# Patient Record
Sex: Female | Born: 1940 | Race: White | Hispanic: No | Smoking: Former smoker
Health system: Southern US, Community
[De-identification: ages and names within clinical notes are randomized; demographics above are authoritative.]

## PROBLEM LIST (undated history)

## (undated) DIAGNOSIS — C801 Malignant (primary) neoplasm, unspecified: Secondary | ICD-10-CM

## (undated) DIAGNOSIS — I712 Thoracic aortic aneurysm, without rupture: Secondary | ICD-10-CM

## (undated) DIAGNOSIS — M6281 Muscle weakness (generalized): Secondary | ICD-10-CM

## (undated) DIAGNOSIS — F329 Major depressive disorder, single episode, unspecified: Secondary | ICD-10-CM

## (undated) DIAGNOSIS — I2510 Atherosclerotic heart disease of native coronary artery without angina pectoris: Secondary | ICD-10-CM

## (undated) DIAGNOSIS — T8859XA Other complications of anesthesia, initial encounter: Secondary | ICD-10-CM

## (undated) DIAGNOSIS — G709 Myoneural disorder, unspecified: Secondary | ICD-10-CM

## (undated) DIAGNOSIS — K219 Gastro-esophageal reflux disease without esophagitis: Secondary | ICD-10-CM

## (undated) DIAGNOSIS — I7121 Aneurysm of the ascending aorta, without rupture: Secondary | ICD-10-CM

## (undated) DIAGNOSIS — M869 Osteomyelitis, unspecified: Secondary | ICD-10-CM

## (undated) DIAGNOSIS — T4145XA Adverse effect of unspecified anesthetic, initial encounter: Secondary | ICD-10-CM

## (undated) DIAGNOSIS — G20A1 Parkinson's disease without dyskinesia, without mention of fluctuations: Secondary | ICD-10-CM

## (undated) DIAGNOSIS — M1990 Unspecified osteoarthritis, unspecified site: Secondary | ICD-10-CM

## (undated) DIAGNOSIS — I10 Essential (primary) hypertension: Secondary | ICD-10-CM

## (undated) DIAGNOSIS — M353 Polymyalgia rheumatica: Secondary | ICD-10-CM

## (undated) DIAGNOSIS — E785 Hyperlipidemia, unspecified: Secondary | ICD-10-CM

## (undated) DIAGNOSIS — F3289 Other specified depressive episodes: Secondary | ICD-10-CM

## (undated) DIAGNOSIS — G20 Parkinson's disease: Secondary | ICD-10-CM

## (undated) DIAGNOSIS — D649 Anemia, unspecified: Secondary | ICD-10-CM

## (undated) DIAGNOSIS — R262 Difficulty in walking, not elsewhere classified: Secondary | ICD-10-CM

## (undated) HISTORY — DX: Essential (primary) hypertension: I10

## (undated) HISTORY — DX: Polymyalgia rheumatica: M35.3

## (undated) HISTORY — PX: OTHER SURGICAL HISTORY: SHX169

## (undated) HISTORY — DX: Thoracic aortic aneurysm, without rupture: I71.2

## (undated) HISTORY — DX: Parkinson's disease: G20

## (undated) HISTORY — DX: Difficulty in walking, not elsewhere classified: R26.2

## (undated) HISTORY — DX: Parkinson's disease without dyskinesia, without mention of fluctuations: G20.A1

## (undated) HISTORY — PX: PERIPHERALLY INSERTED CENTRAL CATHETER INSERTION: SHX2221

## (undated) HISTORY — PX: ROTATOR CUFF REPAIR: SHX139

## (undated) HISTORY — DX: Other specified depressive episodes: F32.89

## (undated) HISTORY — DX: Muscle weakness (generalized): M62.81

## (undated) HISTORY — DX: Malignant (primary) neoplasm, unspecified: C80.1

## (undated) HISTORY — DX: Aneurysm of the ascending aorta, without rupture: I71.21

## (undated) HISTORY — PX: JOINT REPLACEMENT: SHX530

## (undated) HISTORY — DX: Atherosclerotic heart disease of native coronary artery without angina pectoris: I25.10

## (undated) HISTORY — PX: KNEE SURGERY: SHX244

## (undated) HISTORY — DX: Anemia, unspecified: D64.9

## (undated) HISTORY — PX: VARICOSE VEIN SURGERY: SHX832

## (undated) HISTORY — DX: Major depressive disorder, single episode, unspecified: F32.9

## (undated) HISTORY — PX: TONSILLECTOMY: SUR1361

## (undated) HISTORY — DX: Gastro-esophageal reflux disease without esophagitis: K21.9

## (undated) HISTORY — DX: Hyperlipidemia, unspecified: E78.5

## (undated) HISTORY — DX: Unspecified osteoarthritis, unspecified site: M19.90

## (undated) HISTORY — PX: MELANOMA EXCISION: SHX5266

## (undated) HISTORY — DX: Osteomyelitis, unspecified: M86.9

---

## 1995-12-23 HISTORY — PX: BREAST LUMPECTOMY: SHX2

## 2003-12-23 HISTORY — PX: CHOLECYSTECTOMY: SHX55

## 2005-12-22 HISTORY — PX: CARDIAC CATHETERIZATION: SHX172

## 2011-12-23 HISTORY — PX: FOOT SURGERY: SHX648

## 2013-07-05 ENCOUNTER — Ambulatory Visit: Payer: Self-pay | Admitting: Internal Medicine

## 2013-07-08 ENCOUNTER — Encounter: Payer: Self-pay | Admitting: *Deleted

## 2013-07-11 ENCOUNTER — Ambulatory Visit (INDEPENDENT_AMBULATORY_CARE_PROVIDER_SITE_OTHER): Payer: Medicare Other | Admitting: Cardiovascular Disease

## 2013-07-11 ENCOUNTER — Encounter: Payer: Self-pay | Admitting: Cardiovascular Disease

## 2013-07-11 VITALS — BP 121/77 | HR 72 | Ht 66.0 in | Wt 153.5 lb

## 2013-07-11 DIAGNOSIS — I25810 Atherosclerosis of coronary artery bypass graft(s) without angina pectoris: Secondary | ICD-10-CM

## 2013-07-11 DIAGNOSIS — I712 Thoracic aortic aneurysm, without rupture, unspecified: Secondary | ICD-10-CM

## 2013-07-11 DIAGNOSIS — E785 Hyperlipidemia, unspecified: Secondary | ICD-10-CM

## 2013-07-11 DIAGNOSIS — I10 Essential (primary) hypertension: Secondary | ICD-10-CM

## 2013-07-11 DIAGNOSIS — I2510 Atherosclerotic heart disease of native coronary artery without angina pectoris: Secondary | ICD-10-CM | POA: Insufficient documentation

## 2013-07-11 DIAGNOSIS — I7121 Aneurysm of the ascending aorta, without rupture: Secondary | ICD-10-CM

## 2013-07-11 NOTE — Assessment & Plan Note (Signed)
This was measured at 4 cm. These can likely be followed with echocardiograms.     

## 2013-07-11 NOTE — Assessment & Plan Note (Signed)
Continue treatment with atorvastatin. Lipid and liver profile is recommended once a year.

## 2013-07-11 NOTE — Patient Instructions (Addendum)
Continue same medications.  Follow up in 6 months.  

## 2013-07-11 NOTE — Assessment & Plan Note (Addendum)
Overall, she seems to be stable from a cardiac standpoint with no symptoms suggestive of angina. Continue medical therapy. She is not able to tolerate aspirin due to easy bruising. She does have an abnormal EKG but does not seem to be different from most recent one. Most recent stress test in may of 2013 showed evidence of anterior apical score with no ischemia. Her functional capacity is very limited due to complications from hip replacement. If she requires right hip surgery, a cardiac stress test will be needed before the surgery for risk stratification..  Addendum 08/31/2013: The patient had a nuclear stress test which showed moderate/large size anterior wall partially reversible defect suggestive of ischemia with normal EF. Overall, moderate risk stress test. Due to abnormal stress test and poor function capacity, I recommend cardiac cath and possible PCI. Risks and benefits were discussed. She will need this before shoulder surgery.

## 2013-07-11 NOTE — Progress Notes (Signed)
HPI  This is a 72 year old female who is here today to establish cardiovascular care. She was referred from Dr. making house calls. She moved recently from [place] to be close to her family. She has known history of coronary artery disease with previous bare-metal stent placement to the left anterior descending artery in 2007. No cardiac events since then. Most recent stress test was in 2013 which showed no significant ischemia with ejection fraction of 53%. However, there was Akinesis of the distal anterior and apical walls. She suffers from complications related to hip replacement which were recalled. 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 She noted he had revision of left hip replacement and might need the same done on the right side. Her mobility is limited and she walks with a walker. She denies any chest pain, dyspnea or palpitations. She currently lives in an assisted living facility. She reports intolerance to aspirin due to easy bruising she is not allergic.  Allergies  Allergen Reactions  . Adhesive (Tape)   . Isosorbide Dinitrate   . Morphine And Related   . Oxycodone-Acetaminophen   . Sulfa Antibiotics   . Tazobactam      Current Outpatient Prescriptions on File Prior to Visit  Medication Sig Dispense Refill  . amitriptyline (ELAVIL) 25 MG tablet Take 25 mg by mouth at bedtime.      . ARIPiprazole (ABILIFY) 5 MG tablet Take 5 mg by mouth daily.      . carbidopa-levodopa (SINEMET IR) 25-100 MG per tablet Take 1 tablet by mouth 4 (four) times daily.      . carbidopa-levodopa-entacapone (STALEVO) 37.5-150-200 MG per tablet Take 1 tablet by mouth daily.      . clindamycin (CLEOCIN) 300 MG capsule Take 300 mg by mouth 2 (two) times daily.      [name] escitalopram (LEXAPRO) 10 MG tablet Take 15 mg by mouth daily.       . ferrous fumarate (HEMOCYTE) 325 (106 FE) MG TABS Take 1 tablet by mouth daily.      . metoprolol (LOPRESSOR) 100 MG  tablet Take 100 mg by mouth 2 (two) times daily.      . rifampin (RIFADIN) 300 MG capsule Take 300 mg by mouth 2 (two) times daily.      [name] rOPINIRole (REQUIP) 2 MG tablet Take 2 mg by mouth 3 (three) times daily.      . simvastatin (ZOCOR) 40 MG tablet Take 40 mg by mouth at bedtime.      . vitamin C (ASCORBIC ACID) 500 MG tablet Take 1,000 mg by mouth daily.       No current facility-administered medications on file prior to visit.     Past Medical History  Diagnosis Date  . Anemia, unspecified   . Depressive disorder, not elsewhere classified   . Unspecified osteomyelitis, site unspecified   . Esophageal reflux   . Polymyalgia rheumatica   . Paralysis agitans   . Osteoarthrosis, unspecified whether generalized or localized, unspecified site   . Muscle weakness (generalized)   . Difficulty in walking(719.7)   . Parkinson disease   . PMR (polymyalgia rheumatica)   . Cancer     left breast cancer  . Coronary atherosclerosis of unspecified type of vessel, native or graft     2007: LAD PCI with a BMS, 80% distal LCX stenosis treated medically. Most recent stress test in 04/2012 showed distal anterio/apical scar? with no ischemia, EF 53%.   [name]  Unspecified essential hypertension   . Other and unspecified hyperlipidemia   . Thoracic ascending aortic aneurysm     4.0 cm     Past Surgical History  Procedure Laterality Date  . Cardiac catheterization  2007    [place], [place]; x1 stent  . Knee surgery Left   . Varicose vein surgery    . Breast lumpectomy    . Melanoma excision      elbow  . Knee surgery Left   . Hip replacement Bilateral   . Foot surgery Right   . Rotator cuff repair Right      History reviewed. No pertinent family history.   History   Social History  . Marital Status: Widowed    Spouse Name: N/A    Number of Children: N/A  . Years of Education: N/A   Occupational History  . Not on file.   Social History Main Topics  . Smoking status: Former Smoker  -- 0.50 packs/day for 10 years    Types: Cigarettes  . Smokeless tobacco: Not on file  . Alcohol Use: Yes     Comment: rarely  . Drug Use: No  . Sexually Active: Not on file   Other Topics Concern  . Not on file   Social History Narrative  . No narrative on file     ROS Constitutional: Negative for fever, chills, diaphoresis, activity change, appetite change and fatigue.  HENT: Negative for hearing loss, nosebleeds, congestion, sore throat, facial swelling, drooling, trouble swallowing, neck pain, voice change, sinus pressure and tinnitus.  Eyes: Negative for photophobia, pain, discharge and visual disturbance.  Respiratory: Negative for apnea, cough, chest tightness, shortness of breath and wheezing.  Cardiovascular: Negative for chest pain, palpitations and leg swelling.  Gastrointestinal: Negative for nausea, vomiting, abdominal pain, diarrhea, constipation, blood in stool and abdominal distention.  Genitourinary: Negative for dysuria, urgency, frequency, hematuria and decreased urine volume.  Skin: Negative for color change, pallor, rash and wound.  Neurological: Negative for dizziness, tremors, seizures, syncope, speech difficulty, weakness, light-headedness, numbness and headaches.  Psychiatric/Behavioral: Negative for suicidal ideas, hallucinations, behavioral problems and agitation. The patient is not nervous/anxious.     PHYSICAL EXAM   BP 121/77  Pulse 72  Ht 5\' 6"  (1.676 m)  Wt 153 lb 8 oz (69.627 kg)  BMI 24.79 kg/m2 Constitutional: She is oriented to person, place, and time. She appears well-developed and well-nourished. No distress.  HENT: No nasal discharge.  Head: Normocephalic and atraumatic.  Eyes: Pupils are equal and round. Right eye exhibits no discharge. Left eye exhibits no discharge.  Neck: Normal range of motion. Neck supple. No JVD present. No thyromegaly present.  Cardiovascular: Normal rate, regular rhythm, normal heart sounds. Exam reveals no  gallop and no friction rub. No murmur heard.  Pulmonary/Chest: Effort normal and breath sounds normal. No stridor. No respiratory distress. She has no wheezes. She has no rales. She exhibits no tenderness.  Abdominal: Soft. Bowel sounds are normal. She exhibits no distension. There is no tenderness. There is no rebound and no guarding.  Musculoskeletal: Normal range of motion. She exhibits no edema and no tenderness.  Neurological: She is alert and oriented to person, place, and time. Coordination normal.  Skin: Skin is warm and dry. No rash noted. She is not diaphoretic. No erythema. No pallor.  Psychiatric: She has a normal mood and affect. Her behavior is normal. Judgment and thought content normal.     EKG: Sinus  Rhythm  Voltage criteria for LVH  (  S(V1)+R(V6) exceeds 3.50 mV)  -Voltage criteria w/o ST/T abnormality may be normal.   -  Negative precordial T-waves  -Probably normal -consider anteroseptal ischemia.   BORDERLINE   ASSESSMENT AND PLAN

## 2013-07-11 NOTE — Assessment & Plan Note (Signed)
Blood pressure is well controlled on medications. 

## 2013-07-13 ENCOUNTER — Encounter: Payer: Self-pay | Admitting: *Deleted

## 2013-07-14 ENCOUNTER — Encounter: Payer: Self-pay | Admitting: *Deleted

## 2013-07-18 ENCOUNTER — Ambulatory Visit: Payer: Medicare Other | Admitting: Internal Medicine

## 2013-07-18 ENCOUNTER — Encounter: Payer: Self-pay | Admitting: Internal Medicine

## 2013-07-18 VITALS — BP 102/64 | HR 69 | Temp 97.8°F

## 2013-07-18 DIAGNOSIS — T8459XS Infection and inflammatory reaction due to other internal joint prosthesis, sequela: Secondary | ICD-10-CM

## 2013-07-18 LAB — SEDIMENTATION RATE: Sed Rate: 58 mm/hr — ABNORMAL HIGH (ref 0–22)

## 2013-07-18 LAB — COMPLETE METABOLIC PANEL WITH GFR
AST: 19 U/L (ref 0–37)
Albumin: 3.8 g/dL (ref 3.5–5.2)
BUN: 16 mg/dL (ref 6–23)
Calcium: 9.1 mg/dL (ref 8.4–10.5)
Chloride: 104 mEq/L (ref 96–112)
Creat: 0.67 mg/dL (ref 0.50–1.10)
GFR, Est Non African American: 89 mL/min
Glucose, Bld: 90 mg/dL (ref 70–99)
Potassium: 4.6 mEq/L (ref 3.5–5.3)

## 2013-07-18 LAB — C-REACTIVE PROTEIN: CRP: <0.5 mg/dL (ref <0.60)
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
## 2013-08-08 ENCOUNTER — Ambulatory Visit: Payer: Self-pay | Admitting: Orthopedic Surgery

## 2013-08-18 ENCOUNTER — Other Ambulatory Visit: Payer: Self-pay | Admitting: Orthopedic Surgery

## 2013-08-22 HISTORY — PX: CARDIAC CATHETERIZATION: SHX172

## 2013-08-23 ENCOUNTER — Telehealth: Payer: Self-pay

## 2013-08-23 ENCOUNTER — Other Ambulatory Visit: Payer: Self-pay

## 2013-08-23 DIAGNOSIS — Z01810 Encounter for preprocedural cardiovascular examination: Secondary | ICD-10-CM

## 2013-08-23 NOTE — Telephone Encounter (Signed)
Spoke w/ pt.  Dr. Kirke Corin wants her to have a lexiscan myoview before he will give her cardiac clearance for shoulder surgery on 9/11.   Pt sched for Lexiscan at [facility] 08/26/13 @ 10:00 to arrive at 9:30. Instructions mailed to pt 08/23/14.

## 2013-08-24 ENCOUNTER — Ambulatory Visit (INDEPENDENT_AMBULATORY_CARE_PROVIDER_SITE_OTHER): Payer: Medicare Other | Admitting: Internal Medicine

## 2013-08-24 ENCOUNTER — Encounter: Payer: Self-pay | Admitting: Internal Medicine

## 2013-08-24 VITALS — BP 107/68 | HR 65 | Temp 98.3°F | Ht 66.0 in | Wt 155.0 lb

## 2013-08-24 DIAGNOSIS — T889XXS Complication of surgical and medical care, unspecified, sequela: Secondary | ICD-10-CM

## 2013-08-24 DIAGNOSIS — T8459XS Infection and inflammatory reaction due to other internal joint prosthesis, sequela: Secondary | ICD-10-CM

## 2013-08-24 LAB — COMPLETE METABOLIC PANEL WITH GFR
ALT: <8 U/L (ref 0–35)
CO2: 27 mEq/L (ref 19–32)
GFR, Est African American: >89 mL/min
Sodium: 142 mEq/L (ref 135–145)
Total Bilirubin: 0.5 mg/dL (ref 0.3–1.2)
Total Protein: 6.7 g/dL (ref 6.0–8.3)

## 2013-08-24 MED ORDER — CHLORHEXIDINE GLUCONATE CLOTH 2 % EX PADS: 6.0000 | MEDICATED_PAD | Freq: Every day | CUTANEOUS | Status: DC

## 2013-08-24 MED ORDER — RIFAMPIN 300 MG PO CAPS: 300.0000 mg | ORAL_CAPSULE | Freq: Two times a day (BID) | ORAL | Status: DC

## 2013-08-24 MED ORDER — MUPIROCIN 2 % EX OINT: TOPICAL_OINTMENT | Freq: Two times a day (BID) | CUTANEOUS | Status: DC

## 2013-08-24 MED ORDER — CLINDAMYCIN HCL 300 MG PO CAPS: 300.0000 mg | ORAL_CAPSULE | Freq: Two times a day (BID) | ORAL | Status: DC

## 2013-08-24 NOTE — Progress Notes (Signed)
[facility] NOTE  RFV: mrsa prosthetic joint infection  Subjective:    Patient ID: Sabrina Duncan, female    DOB: September 29, 1941, 72 y.o.   MRN: [id number]  HPI 72 year old female with h/o parkisons, on sinemet,  coronary artery disease with previous bare-metal stent placement to the left anterior descending artery in 2007.and numerous orthopedic surgeries c/b joint infections. 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 She was first seen at end of July at [facility]. Inflammatory markers still elevated at  Lab Results  Component Value Date   ESRSEDRATE 58* 07/18/2013   Lab Results  Component Value Date   CRP <0.5 07/18/2013   - has had numerous surgeries and replacement due to recall on implants.  The patient was evaluated by Dr. Jones Broom from [facility] and have recommended to undergo right shoulder total arthoplasty. She is undergoing cardiac evaluation this coming Friday. And pre-op evaluation.  Ortho hx:                                -TKR in oct  lef tTHR 2010, HW failiure, repair 2010  fx elbow 2011  HW repomve of left elbow 2010  HW removal left hip rivsion 2013  steged 2 revionso July 2013  Right foot surgery 2013,  Left hip surgery repair jan 2014  Left hip revison [facility] 2014,  Current Outpatient Prescriptions on File Prior to Visit  Medication Sig Dispense Refill  . acetaminophen (TYLENOL) 500 MG tablet Take 500 mg by mouth as needed for pain.      [name] acetaminophen-codeine (TYLENOL #4) 300-60 MG per tablet Take 1 tablet by mouth every 6 (six) hours as needed for pain.      [name] amitriptyline (ELAVIL) 25 MG tablet Take 25 mg by mouth at bedtime.      . ARIPiprazole (ABILIFY) 5 MG tablet Take 5 mg by mouth daily.      [name] atorvastatin (LIPITOR) 20 MG tablet Take 20 mg by mouth daily.      .  carbidopa-levodopa (SINEMET IR) 25-100 MG per tablet Take 1 tablet by mouth 4 (four) times daily.      . carbidopa-levodopa-entacapone (STALEVO) 37.5-150-200 MG per tablet Take 1 tablet by mouth daily.      . clindamycin (CLEOCIN) 300 MG capsule Take 300 mg by mouth 2 (two) times daily.      [name] escitalopram (LEXAPRO) 10 MG tablet Take 15 mg by mouth daily.       . ferrous fumarate (HEMOCYTE) 325 (106 FE) MG TABS Take 1 tablet by mouth daily.      [name] loperamide (IMODIUM) 2 MG capsule Take 2 mg by mouth as needed for diarrhea or loose stools.      . metoprolol (LOPRESSOR) 100 MG tablet Take 100 mg by mouth 2 (two) times daily.      . rifampin (RIFADIN) 300 MG capsule Take 300 mg by mouth 2 (two) times daily.      [name] rOPINIRole (REQUIP) 2 MG tablet Take 2 mg by mouth 3 (three) times daily.      . vitamin C (ASCORBIC ACID) 500 MG tablet Take 1,000 mg by mouth daily.       No current facility-administered medications on file prior to visit.   Active Ambulatory Problems  Diagnosis Date Noted  . Coronary atherosclerosis of unspecified type of vessel, native or graft   . Unspecified essential hypertension   . Other and unspecified hyperlipidemia   . Thoracic ascending aortic aneurysm    Resolved Ambulatory Problems    Diagnosis Date Noted  . No Resolved Ambulatory Problems   Past Medical History  Diagnosis Date  . Anemia, unspecified   . Depressive disorder, not elsewhere classified   . Unspecified osteomyelitis, site unspecified   . Esophageal reflux   . Polymyalgia rheumatica   . Paralysis agitans   . Osteoarthrosis, unspecified whether generalized or localized, unspecified site   . Muscle weakness (generalized)   . Difficulty in walking(719.7)   . Parkinson disease   . PMR (polymyalgia rheumatica)   . Cancer    Soc hx: unchanged, still living at SNF    Review of Systems No dysuria, no back pain, no fever, chills, no symptoms suggestive of UTI    Objective:   Physical  Exam  BP 107/68  Pulse 65  Temp(Src) 98.3 F (36.8 C) (Oral)  Ht 5\' 6"  (1.676 m)  Wt 155 lb (70.308 kg)  BMI 25.03 kg/m2 Physical Exam  Constitutional:  oriented to person, place, and time. appears well-developed and well-nourished. No distress.  HENT:  Mouth/Throat: Oropharynx is clear and moist. No oropharyngeal exudate.  Cardiovascular: Normal rate, regular rhythm and normal heart sounds. Exam reveals no gallop and no friction rub.  No murmur heard.  Pulmonary/Chest: Effort normal and breath sounds normal. No respiratory distress. no wheezes.  Abdominal: Soft. Bowel sounds are normal. exhibits no distension. There is no tenderness.  Lymphadenopathy:  no cervical adenopathy.  Neurological: alert and oriented to person, place, and time.  Skin: Skin is warm and dry. No rash noted. No erythema.  Psychiatric: a normal mood and affect.  behavior is normal.       Assessment & Plan:  MRSA prosthetic joint infection = will continue on clindamycin and rifampin until we see her in 4 wks.. Will check sed rate, crp, and cmp.  Upcoming shoulder arthroplasty = due to having prior mrsa infections, we will decolonize with mupirocin intranasally TID x 5 days and anticipate that Pre Admit Testing clinic visit will give chlorohexedine wipes for 3 days prior to surgery to help minimize risk of surgery. She states that she has a UA/Urine cx to be collected this evening as ordered by surgery. Since she is not having any urinary symptoms, I don't believe it is necessary to test, nor to treat. Per IDSA guidelines, asymptomatic bacturia not necessarily needing treatment.  She is medically cleared for upcoming shoulder surgery

## 2013-08-25 ENCOUNTER — Telehealth: Payer: Self-pay | Admitting: *Deleted

## 2013-08-25 NOTE — Telephone Encounter (Signed)
Spoke with [employer].  Needed clarifiaction about when to start Mupurocin ointment.  Pt scheduled for surgery on 09/01/13.  To start 5 days prior to surgery.  Faxed last OV note to [facility].

## 2013-08-25 NOTE — Telephone Encounter (Signed)
Returned call.  Unable to speak directly with the nurse.  Left message to return call.  Dr. Feliz Beam notes reflect pre-treatment information.

## 2013-08-26 ENCOUNTER — Ambulatory Visit: Payer: Self-pay | Admitting: Cardiovascular Disease

## 2013-08-26 DIAGNOSIS — Z01810 Encounter for preprocedural cardiovascular examination: Secondary | ICD-10-CM

## 2013-08-29 ENCOUNTER — Other Ambulatory Visit: Payer: Self-pay

## 2013-08-29 ENCOUNTER — Telehealth: Payer: Self-pay | Admitting: *Deleted

## 2013-08-29 ENCOUNTER — Other Ambulatory Visit (HOSPITAL_COMMUNITY): Payer: 59

## 2013-08-29 DIAGNOSIS — Z01810 Encounter for preprocedural cardiovascular examination: Secondary | ICD-10-CM

## 2013-08-29 NOTE — Telephone Encounter (Signed)
Left message for Sabrina Duncan that we will have to postpone pt's shoulder surgery until after her cardiac cath.

## 2013-08-29 NOTE — Telephone Encounter (Signed)
Dr. Veda Canning office called needing her cardiac clearance for surgery.

## 2013-08-29 NOTE — Telephone Encounter (Signed)
Sabrina Duncan "nurse" at patient's assisted living facility called and left voice mail that patient's surgery for 09/01/13 has been discontinued and she needs a D/C order for the bactriban ointment and body wash. I returned the call and the phone was answered by a patient; I asked to speak with Sabrina Duncan or another nurse and was told it was not that kind of facility and I would need to call back in 20 minutes to speak to a "nurse".   Wendall Mola CMA

## 2013-08-30 ENCOUNTER — Other Ambulatory Visit (HOSPITAL_COMMUNITY): Payer: 59

## 2013-08-30 ENCOUNTER — Telehealth: Payer: Self-pay

## 2013-08-30 NOTE — Telephone Encounter (Signed)
Nursing home was questioning whether to continue with shoulder surgery prep.   Instructed them that they should contact [facility] for any surgery prep from their office.   However, we do not require a medicated body wash the night before catheterization.

## 2013-08-30 NOTE — Telephone Encounter (Signed)
Pt med tech called and has question about catherization. Please call.

## 2013-09-01 ENCOUNTER — Ambulatory Visit: Payer: Self-pay | Admitting: Cardiovascular Disease

## 2013-09-01 ENCOUNTER — Encounter (HOSPITAL_COMMUNITY): Admission: RE | Payer: Self-pay | Source: Ambulatory Visit

## 2013-09-01 ENCOUNTER — Inpatient Hospital Stay (HOSPITAL_COMMUNITY): Admission: RE | Admit: 2013-09-01 | Payer: 59 | Source: Ambulatory Visit | Admitting: Orthopedic Surgery

## 2013-09-01 DIAGNOSIS — I2510 Atherosclerotic heart disease of native coronary artery without angina pectoris: Secondary | ICD-10-CM

## 2013-09-01 SURGERY — ARTHROPLASTY, SHOULDER, TOTAL, REVERSE
Anesthesia: Choice | Laterality: Right

## 2013-09-06 ENCOUNTER — Telehealth: Payer: Self-pay

## 2013-09-06 NOTE — Telephone Encounter (Signed)
Spoke w/ Tobi Bastos at [facility] and let her know that pt may proceed with shoulder surgery.

## 2013-09-06 NOTE — Telephone Encounter (Signed)
Message copied by Marilynne Halsted on Tue Sep 06, 2013  4:23 PM ------      Message from: Lorine Bears A      Created: Mon Sep 05, 2013  5:17 PM       Cardiac cath was fine. I already discussed this with the patient. Please notify Dr. Veda Canning office (orthopedics) of cath results and that they can go ahead and proceed with shoulder surgery. ------

## 2013-09-14 ENCOUNTER — Telehealth: Payer: Self-pay

## 2013-09-14 ENCOUNTER — Telehealth: Payer: Self-pay | Admitting: *Deleted

## 2013-09-14 NOTE — Telephone Encounter (Signed)
Message copied by Lurlean Leyden on Wed Sep 14, 2013  9:09 AM ------      Message from: Judyann Munson      Created: Sun Sep 11, 2013 10:39 PM      Regarding: follow up appt       Can we make sure we have an appt for Sabrina Duncan coming up. thanks ------

## 2013-09-14 NOTE — Telephone Encounter (Signed)
She has an appt 09/21/13 to see you. Called and she intends to keep this appt.

## 2013-09-14 NOTE — Telephone Encounter (Signed)
She is at low risk from a cardiac standpoint.  

## 2013-09-14 NOTE — Telephone Encounter (Signed)
Needs clearance faxed for shoulder surgery. Fax [phone]

## 2013-09-16 ENCOUNTER — Ambulatory Visit: Payer: 59 | Admitting: Cardiovascular Disease

## 2013-09-21 ENCOUNTER — Encounter: Payer: Self-pay | Admitting: Internal Medicine

## 2013-09-21 ENCOUNTER — Ambulatory Visit (INDEPENDENT_AMBULATORY_CARE_PROVIDER_SITE_OTHER): Payer: Medicare Other | Admitting: Internal Medicine

## 2013-09-21 ENCOUNTER — Telehealth: Payer: Self-pay | Admitting: *Deleted

## 2013-09-21 VITALS — BP 128/73 | HR 75 | Temp 97.4°F | Wt 155.0 lb

## 2013-09-21 DIAGNOSIS — T889XXS Complication of surgical and medical care, unspecified, sequela: Secondary | ICD-10-CM

## 2013-09-21 DIAGNOSIS — T8450XS Infection and inflammatory reaction due to unspecified internal joint prosthesis, sequela: Secondary | ICD-10-CM

## 2013-09-21 LAB — C-REACTIVE PROTEIN: CRP: <0.5 mg/dL (ref <0.60)

## 2013-09-21 LAB — SEDIMENTATION RATE: Sed Rate: 93 mm/hr — ABNORMAL HIGH (ref 0–22)

## 2013-09-21 NOTE — Progress Notes (Signed)
[facility] NOTE  RFV: routine, follow up for chronic PJI Subjective:    Patient ID: Sabrina Duncan, female    DOB: 07-04-1941, 72 y.o.   MRN: [id number]  HPI 72 year old female with h/o parkisons, on sinemet, coronary artery disease with previous bare-metal stent placement to the left anterior descending artery in 2007.and numerous orthopedic surgeries c/b joint infections. 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 She was first seen at end of July at [facility]. Inflammatory markers still elevated at sed rate of 71 and crp 0.5.  She states that her right shoulder is very stiff and painful to move. She is looking forward to having her shoulder surgery on the 9th. Has PAT visit on the 5th. Her surgery was postponed last month due to + stress test, but heart catheterization was fine. She is now cleared from cardiac standpoint. She states that she had urinary frequency that has resolved, often has to go to the bathroom on 1-2 am. She had recent ua nad urine cutlure that was negative per patient report.  Tolerating clindamycin and rifampin for chronic suppression of hip.  Current Outpatient Prescriptions on File Prior to Visit  Medication Sig Dispense Refill  . acetaminophen (TYLENOL) 500 MG tablet Take 500 mg by mouth as needed for pain.      [name] acetaminophen-codeine (TYLENOL #4) 300-60 MG per tablet Take 1 tablet by mouth every 6 (six) hours as needed for pain.      [name] amitriptyline (ELAVIL) 25 MG tablet Take 25 mg by mouth at bedtime.      . ARIPiprazole (ABILIFY) 5 MG tablet Take 5 mg by mouth daily.      [name] atorvastatin (LIPITOR) 20 MG tablet Take 20 mg by mouth daily.      . carbidopa-levodopa (SINEMET IR) 25-100 MG per tablet Take 1 tablet by mouth 4 (four) times daily.      . carbidopa-levodopa-entacapone (STALEVO) 37.5-150-200  MG per tablet Take 1 tablet by mouth daily.      . Chlorhexidine Gluconate Cloth 2 % PADS Apply 6 each topically daily at 6 (six) AM.  18 each  1  . clindamycin (CLEOCIN) 300 MG capsule Take 1 capsule (300 mg total) by mouth 2 (two) times daily.  60 capsule  3  . escitalopram (LEXAPRO) 10 MG tablet Take 15 mg by mouth daily.       . feeding supplement (ENSURE IMMUNE HEALTH) LIQD Take 237 mLs by mouth every morning.      . ferrous fumarate (HEMOCYTE) 325 (106 FE) MG TABS Take 1 tablet by mouth daily.      [name] loperamide (IMODIUM) 2 MG capsule Take 2 mg by mouth as needed for diarrhea or loose stools.      . metoprolol (LOPRESSOR) 100 MG tablet Take 100 mg by mouth 2 (two) times daily.      . mupirocin ointment (BACTROBAN) 2 % Apply topically 2 (two) times daily. Intranasally x 5 days  15 g  0  . rifampin (RIFADIN) 300 MG capsule Take 300 mg by mouth 2 (two) times daily.      . rifampin (RIFADIN) 300 MG capsule Take 1 capsule (300 mg total) by mouth 2 (two) times daily.  60 capsule  3  . rOPINIRole (REQUIP) 2 MG tablet Take 2 mg by mouth 3 (three) times  daily.      . vitamin C (ASCORBIC ACID) 500 MG tablet Take 1,000 mg by mouth daily.       No current facility-administered medications on file prior to visit.   Active Ambulatory Problems    Diagnosis Date Noted  . Coronary atherosclerosis of unspecified type of vessel, native or graft   . Unspecified essential hypertension   . Other and unspecified hyperlipidemia   . Thoracic ascending aortic aneurysm    Resolved Ambulatory Problems    Diagnosis Date Noted  . No Resolved Ambulatory Problems   Past Medical History  Diagnosis Date  . Anemia, unspecified   . Depressive disorder, not elsewhere classified   . Unspecified osteomyelitis, site unspecified   . Esophageal reflux   . Polymyalgia rheumatica   . Paralysis agitans   . Osteoarthrosis, unspecified whether generalized or localized, unspecified site   . Muscle weakness (generalized)    . Difficulty in walking(719.7)   . Parkinson disease   . PMR (polymyalgia rheumatica)   . Cancer        Review of Systems     Objective:   Physical Exam BP 128/73  Pulse 75  Temp(Src) 97.4 F (36.3 C) (Oral)  Wt 155 lb (70.308 kg)  BMI 25.03 kg/m2 Physical Exam  Constitutional:  oriented to person, place, and time. He appears well-developed and well-nourished. No distress.  HENT:  Mouth/Throat: Oropharynx is clear and moist. No oropharyngeal exudate.  Cardiovascular: Normal rate, regular rhythm and normal heart sounds. Exam reveals no gallop and no friction rub.  No murmur heard.  Pulmonary/Chest: Effort normal and breath sounds normal. No respiratory distress.  no wheezes.  Abdominal: Soft. Bowel sounds are normal. He exhibits no distension. There is no tenderness.  Lymphadenopathy: no cervical adenopathy.  Neurological: alert and oriented to person, place, and time.  Skin: Skin is warm and dry. No rash noted. No erythema.  Psychiatric:  a normal mood and affect.  behavior is normal.        Assessment & Plan:  Chronic pji = check inflam markers, sed rate, crp, and cbc with diff  mrsa decolonization = mupirocin cream and baths 5 days prior to surgery  Health maintenance = recommend shingles at her next visit or through her pcp  rtc in 4 -5 wks.

## 2013-09-21 NOTE — Telephone Encounter (Signed)
Mupirocin ointment to start 5 days prior to surgery on 09/29/13.  Start date would be 09/24/13.  Will fax handwritten order to [facility].

## 2013-09-22 ENCOUNTER — Ambulatory Visit (INDEPENDENT_AMBULATORY_CARE_PROVIDER_SITE_OTHER): Payer: Medicare Other | Admitting: Cardiovascular Disease

## 2013-09-22 ENCOUNTER — Encounter: Payer: Self-pay | Admitting: Cardiovascular Disease

## 2013-09-22 ENCOUNTER — Ambulatory Visit: Payer: Medicare Other | Admitting: Cardiovascular Disease

## 2013-09-22 VITALS — BP 91/58 | HR 60 | Ht 66.0 in | Wt 154.5 lb

## 2013-09-22 DIAGNOSIS — I712 Thoracic aortic aneurysm, without rupture, unspecified: Secondary | ICD-10-CM

## 2013-09-22 DIAGNOSIS — E785 Hyperlipidemia, unspecified: Secondary | ICD-10-CM

## 2013-09-22 DIAGNOSIS — I2510 Atherosclerotic heart disease of native coronary artery without angina pectoris: Secondary | ICD-10-CM

## 2013-09-22 DIAGNOSIS — I10 Essential (primary) hypertension: Secondary | ICD-10-CM

## 2013-09-22 NOTE — Progress Notes (Signed)
HPI  This is a 72 year old female who is here for a follow up visit regarding CAD. She moved recently from [place] to be close to her family. She has known history of coronary artery disease with previous bare-metal stent placement to the left anterior descending artery in 2007. No cardiac events since then.  She suffers from complications related to hip replacement which were recalled. 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 She noted he had revision of left hip replacement and might need the same done on the right side. Her mobility is limited and she walks with a walker. Functional capacity is limited due to that. She underwent a nuclear stress test which showed possible ischemic/previous scar in anterior wall. I proceeded with cardiac cath which showed patient LAD stent with mild disease overall.   Allergies  Allergen Reactions  . Adhesive [Tape]   . Isosorbide Dinitrate   . Morphine And Related   . Oxycodone-Acetaminophen   . Sulfa Antibiotics   . Tazobactam      Current Outpatient Prescriptions on File Prior to Visit  Medication Sig Dispense Refill  . acetaminophen (TYLENOL) 500 MG tablet Take 500 mg by mouth as needed for pain.      [name] acetaminophen-codeine (TYLENOL #4) 300-60 MG per tablet Take 1 tablet by mouth every 6 (six) hours as needed for pain.      [name] amitriptyline (ELAVIL) 25 MG tablet Take 25 mg by mouth at bedtime.      . ARIPiprazole (ABILIFY) 5 MG tablet Take 5 mg by mouth daily.      [name] atorvastatin (LIPITOR) 20 MG tablet Take 20 mg by mouth daily.      . carbidopa-levodopa (SINEMET IR) 25-100 MG per tablet Take 1 tablet by mouth 4 (four) times daily.      . carbidopa-levodopa-entacapone (STALEVO) 37.5-150-200 MG per tablet Take 1 tablet by mouth daily.      . Chlorhexidine Gluconate Cloth 2 % PADS Apply 6 each topically daily at 6 (six) AM.  18 each  1  . clindamycin (CLEOCIN) 300 MG capsule Take 1 capsule (300 mg  total) by mouth 2 (two) times daily.  60 capsule  3  . escitalopram (LEXAPRO) 10 MG tablet Take 15 mg by mouth daily.       . feeding supplement (ENSURE IMMUNE HEALTH) LIQD Take 237 mLs by mouth every morning.      . ferrous fumarate (HEMOCYTE) 325 (106 FE) MG TABS Take 1 tablet by mouth daily.      [name] loperamide (IMODIUM) 2 MG capsule Take 2 mg by mouth as needed for diarrhea or loose stools.      . metoprolol (LOPRESSOR) 100 MG tablet Take 100 mg by mouth 2 (two) times daily.      . mupirocin ointment (BACTROBAN) 2 % Apply topically 2 (two) times daily. Intranasally x 5 days  15 g  0  . rifampin (RIFADIN) 300 MG capsule Take 300 mg by mouth 2 (two) times daily.      . rifampin (RIFADIN) 300 MG capsule Take 1 capsule (300 mg total) by mouth 2 (two) times daily.  60 capsule  3  . rOPINIRole (REQUIP) 2 MG tablet Take 2 mg by mouth 3 (three) times daily.      . vitamin C (ASCORBIC ACID) 500 MG tablet Take 1,000 mg by mouth daily.       No current facility-administered medications on file prior to visit.  Past Medical History  Diagnosis Date  . Anemia, unspecified   . Depressive disorder, not elsewhere classified   . Unspecified osteomyelitis, site unspecified   . Esophageal reflux   . Polymyalgia rheumatica   . Paralysis agitans   . Osteoarthrosis, unspecified whether generalized or localized, unspecified site   . Muscle weakness (generalized)   . Difficulty in walking(719.7)   . Parkinson disease   . PMR (polymyalgia rheumatica)   . Cancer     left breast cancer  . Coronary atherosclerosis of unspecified type of vessel, native or graft     2007: LAD PCI with a BMS, 80% distal LCX stenosis treated medically. Most recent stress test in 04/2012 showed distal anterio/apical scar? with no ischemia, EF 53%.   [name] Unspecified essential hypertension   . Other and unspecified hyperlipidemia   . Thoracic ascending aortic aneurysm     4.0 cm     Past Surgical History  Procedure  Laterality Date  . Cardiac catheterization  2007    [place], [place]; x1 stent  . Knee surgery Left   . Varicose vein surgery    . Breast lumpectomy    . Melanoma excision      elbow  . Knee surgery Left   . Hip replacement Bilateral   . Foot surgery Right   . Rotator cuff repair Right      No family history on file.   History   Social History  . Marital Status: Widowed    Spouse Name: N/A    Number of Children: N/A  . Years of Education: N/A   Occupational History  . Not on file.   Social History Main Topics  . Smoking status: Former Smoker -- 0.50 packs/day for 10 years    Types: Cigarettes  . Smokeless tobacco: Not on file  . Alcohol Use: Yes     Comment: rarely  . Drug Use: No  . Sexual Activity: Not on file   Other Topics Concern  . Not on file   Social History Narrative  . No narrative on file       PHYSICAL EXAM   There were no vitals taken for this visit. Constitutional: She is oriented to person, place, and time. She appears well-developed and well-nourished. No distress.  HENT: No nasal discharge.  Head: Normocephalic and atraumatic.  Eyes: Pupils are equal and round. Right eye exhibits no discharge. Left eye exhibits no discharge.  Neck: Normal range of motion. Neck supple. No JVD present. No thyromegaly present.  Cardiovascular: Normal rate, regular rhythm, normal heart sounds. Exam reveals no gallop and no friction rub. No murmur heard.  Pulmonary/Chest: Effort normal and breath sounds normal. No stridor. No respiratory distress. She has no wheezes. She has no rales. She exhibits no tenderness.  Abdominal: Soft. Bowel sounds are normal. She exhibits no distension. There is no tenderness. There is no rebound and no guarding.  Musculoskeletal: Normal range of motion. She exhibits no edema and no tenderness.  Neurological: She is alert and oriented to person, place, and time. Coordination normal.  Skin: Skin is warm and dry. No rash noted. She is  not diaphoretic. No erythema. No pallor.  Psychiatric: She has a normal mood and affect. Her behavior is normal. Judgment and thought content normal.       ASSESSMENT AND PLAN

## 2013-09-22 NOTE — Patient Instructions (Addendum)
Continue same medications.   Your physician wants you to follow-up in: 6 months.  You will receive a reminder letter in the mail two months in advance. If you don't receive a letter, please call our office to schedule the follow-up appointment.  

## 2013-09-23 ENCOUNTER — Encounter (HOSPITAL_COMMUNITY): Payer: Self-pay | Admitting: Respiratory Therapy

## 2013-09-26 ENCOUNTER — Encounter (HOSPITAL_COMMUNITY)
Admission: RE | Admit: 2013-09-26 | Discharge: 2013-09-26 | Disposition: A | Discharge status: Home / Self Care | Payer: Medicare Other | Source: Ambulatory Visit | Attending: Orthopedic Surgery | Admitting: Orthopedic Surgery

## 2013-09-26 ENCOUNTER — Encounter (HOSPITAL_COMMUNITY): Payer: Self-pay

## 2013-09-26 DIAGNOSIS — Z01812 Encounter for preprocedural laboratory examination: Secondary | ICD-10-CM | POA: Insufficient documentation

## 2013-09-26 DIAGNOSIS — Z01818 Encounter for other preprocedural examination: Secondary | ICD-10-CM | POA: Insufficient documentation

## 2013-09-26 HISTORY — DX: Myoneural disorder, unspecified: G70.9

## 2013-09-26 HISTORY — DX: Other complications of anesthesia, initial encounter: T88.59XA

## 2013-09-26 HISTORY — DX: Adverse effect of unspecified anesthetic, initial encounter: T41.45XA

## 2013-09-26 LAB — COMPREHENSIVE METABOLIC PANEL
ALT: <5 U/L (ref 0–35)
AST: 19 U/L (ref 0–37)
Alkaline Phosphatase: 145 U/L — ABNORMAL HIGH (ref 39–117)
BUN: 15 mg/dL (ref 6–23)
CO2: 28 mEq/L (ref 19–32)
Calcium: 9.3 mg/dL (ref 8.4–10.5)
GFR calc Af Amer: >90 mL/min (ref >90)
GFR calc non Af Amer: >90 mL/min (ref >90)
Glucose, Bld: 84 mg/dL (ref 70–99)
Potassium: 4.4 mEq/L (ref 3.5–5.1)
Sodium: 140 mEq/L (ref 135–145)
Total Bilirubin: 0.4 mg/dL (ref 0.3–1.2)
Total Protein: 7.2 g/dL (ref 6.0–8.3)

## 2013-09-26 LAB — CBC WITH DIFFERENTIAL/PLATELET
Eosinophils Absolute: 0.1 10*3/uL (ref 0.0–0.7)
Eosinophils Relative: 3 % (ref 0–5)
HCT: 38.5 % (ref 36.0–46.0)
Hemoglobin: 12.8 g/dL (ref 12.0–15.0)
Lymphocytes Relative: 23 % (ref 12–46)
Lymphs Abs: 0.9 10*3/uL (ref 0.7–4.0)
MCH: 33.3 pg (ref 26.0–34.0)
MCHC: 33.2 g/dL (ref 30.0–36.0)
MCV: 100.3 fL — ABNORMAL HIGH (ref 78.0–100.0)
Monocytes Relative: 10 % (ref 3–12)
Neutrophils Relative %: 62 % (ref 43–77)
Platelets: 188 10*3/uL (ref 150–400)
RBC: 3.84 MIL/uL — ABNORMAL LOW (ref 3.87–5.11)
WBC: 3.9 10*3/uL — ABNORMAL LOW (ref 4.0–10.5)

## 2013-09-26 LAB — PROTIME-INR: Prothrombin Time: 12.8 seconds (ref 11.6–15.2)

## 2013-09-26 LAB — TYPE AND SCREEN: Antibody Screen: NEGATIVE

## 2013-09-26 NOTE — Progress Notes (Signed)
Call to [facility], for U/A result, asked that it be faxed to [phone]

## 2013-09-26 NOTE — Pre-Procedure Instructions (Signed)
Sabrina Duncan  09/26/2013   Your procedure is scheduled on:  09/29/2013  Report to [facility]  2 * 3  Entrance a    at  8:45 AM.  Call this number if you have problems the morning of surgery: [phone]   Remember:   Do not eat food or drink liquids after midnight. ON Wednesday   Take these medicines the morning of surgery with A SIP OF WATER: Abilify , Sinemet, Stalevo, Metoprolol, Requip   Do not wear jewelry, make-up or nail polish.  Do not wear lotions, powders, or perfumes. You may wear deodorant.  Do not shave 48 hours prior to surgery.   Do not bring valuables to the hospital.  [facility] is not responsible                  for any belongings or valuables.               Contacts, dentures or bridgework may not be worn into surgery.  Leave suitcase in the car. After surgery it may be brought to your room.  For patients admitted to the hospital, discharge time is determined by your                treatment team.               Patients discharged the day of surgery will not be allowed to drive  home.  Name and phone number of your driver: /w family & friends   Special Instructions: Shower using CHG 2 nights before surgery and the night before surgery.  If you shower the day of surgery use CHG.  Use special wash - you have one bottle of CHG for all showers.  You should use approximately 1/3 of the bottle for each shower.   Please read over the following fact sheets that you were given: Pain Booklet, Coughing and Deep Breathing, Blood Transfusion Information, MRSA Information and Surgical Site Infection Prevention

## 2013-09-27 NOTE — Progress Notes (Signed)
Anesthesia Chart Review:  Patient is a 72 year old female scheduled for right reverse total shoulder arthroplasty on 09/29/13 by Dr. Ave Filter.    History includes former smoker, polymyalgia rheumatica, Parkinson's disease/paralysis agitans, thoracic ascending aorta (measured at 4 cm per 07/11/13 cardiology notes), CAD s/p BMS to LAD '07 ([place]?), HTN, GERD, anemia, left breast cancer s/p lumpectomy and chemoradiation, melanoma excision, depression, MRSA THR joint infection (seeing Dr. Drue Second with ID), hallucinations with anesthesia.  Cardiologist is Dr. Lorine Bears who felt she was at low risk from a cardiac standpoint (see 09/14/13 telephone encounter).  Dr. Judyann Munson (ID) also cleared her from a medical standpoint.  Dr. Aram Beecham is listed as her PCP.  EKG on 09/22/13 showed SR, voltage criteria for LVH.  On 08/26/13 she had an abnormal nuclear stress test at [facility] that showed a defect in the mid-distal anterior and apical territory, EF 65%, no significant wall motion abnormality.  She subsequently had a cardiac cath on 09/01/13 that showed: 20% pLAD, 10% pCX, 20% mCX, 40% pRCA, 20% mRCA.  Patent mid LAD stent.  EF 55%.  Continued medical therapy was recommended.   Preoperative labs from 09/26/13 noted.  Her UA from [facility] on 09/19/13 showed small leukocytes, but negative nitrites, 11-20 WBC, but no bacteria/HPF.  There was no recent CXR at [facility], so she will need this done on the day of surgery.  She was given ID and cardiac clearance.  If no acute changes then I would anticipate that she could proceed as planned.  Sabrina Duncan [facility] Short Stay Center/Anesthesiology Phone [phone] 09/27/2013 1:33 PM

## 2013-09-28 MED ORDER — CEFAZOLIN SODIUM-DEXTROSE 2-3 GM-% IV SOLR
2.0000 g | INTRAVENOUS | Status: AC
  Administered 2013-09-29: 2 g via INTRAVENOUS
  Filled 2013-09-28: qty 50

## 2013-09-29 ENCOUNTER — Inpatient Hospital Stay (HOSPITAL_COMMUNITY): Payer: Medicare Other | Admitting: Registered Nurse

## 2013-09-29 ENCOUNTER — Encounter (HOSPITAL_COMMUNITY): Payer: Medicare Other | Admitting: Vascular Surgery

## 2013-09-29 ENCOUNTER — Encounter (HOSPITAL_COMMUNITY): Payer: Self-pay | Admitting: *Deleted

## 2013-09-29 ENCOUNTER — Inpatient Hospital Stay (HOSPITAL_COMMUNITY)
Admission: RE | Admit: 2013-09-29 | Discharge: 2013-10-03 | DRG: 483 | Disposition: A | Discharge status: Skilled Nursing Facility | Payer: Medicare Other | Source: Ambulatory Visit | Attending: Orthopedic Surgery | Admitting: Orthopedic Surgery

## 2013-09-29 ENCOUNTER — Encounter (HOSPITAL_COMMUNITY)
Admission: RE | Disposition: A | Discharge status: Skilled Nursing Facility | Payer: Self-pay | Source: Ambulatory Visit | Attending: Orthopedic Surgery

## 2013-09-29 ENCOUNTER — Inpatient Hospital Stay (HOSPITAL_COMMUNITY): Payer: Medicare Other

## 2013-09-29 DIAGNOSIS — Z87891 Personal history of nicotine dependence: Secondary | ICD-10-CM

## 2013-09-29 DIAGNOSIS — Z9861 Coronary angioplasty status: Secondary | ICD-10-CM

## 2013-09-29 DIAGNOSIS — G20A1 Parkinson's disease without dyskinesia, without mention of fluctuations: Secondary | ICD-10-CM | POA: Diagnosis present

## 2013-09-29 DIAGNOSIS — Z7982 Long term (current) use of aspirin: Secondary | ICD-10-CM

## 2013-09-29 DIAGNOSIS — E785 Hyperlipidemia, unspecified: Secondary | ICD-10-CM | POA: Diagnosis present

## 2013-09-29 DIAGNOSIS — Z9089 Acquired absence of other organs: Secondary | ICD-10-CM

## 2013-09-29 DIAGNOSIS — Z9221 Personal history of antineoplastic chemotherapy: Secondary | ICD-10-CM

## 2013-09-29 DIAGNOSIS — M12819 Other specific arthropathies, not elsewhere classified, unspecified shoulder: Principal | ICD-10-CM | POA: Diagnosis present

## 2013-09-29 DIAGNOSIS — Z853 Personal history of malignant neoplasm of breast: Secondary | ICD-10-CM

## 2013-09-29 DIAGNOSIS — Z01812 Encounter for preprocedural laboratory examination: Secondary | ICD-10-CM

## 2013-09-29 DIAGNOSIS — Z923 Personal history of irradiation: Secondary | ICD-10-CM

## 2013-09-29 DIAGNOSIS — Z882 Allergy status to sulfonamides status: Secondary | ICD-10-CM

## 2013-09-29 DIAGNOSIS — I2510 Atherosclerotic heart disease of native coronary artery without angina pectoris: Secondary | ICD-10-CM | POA: Diagnosis present

## 2013-09-29 DIAGNOSIS — M19019 Primary osteoarthritis, unspecified shoulder: Secondary | ICD-10-CM

## 2013-09-29 DIAGNOSIS — K219 Gastro-esophageal reflux disease without esophagitis: Secondary | ICD-10-CM | POA: Diagnosis present

## 2013-09-29 DIAGNOSIS — I10 Essential (primary) hypertension: Secondary | ICD-10-CM | POA: Diagnosis present

## 2013-09-29 DIAGNOSIS — F329 Major depressive disorder, single episode, unspecified: Secondary | ICD-10-CM | POA: Diagnosis present

## 2013-09-29 DIAGNOSIS — Z79899 Other long term (current) drug therapy: Secondary | ICD-10-CM

## 2013-09-29 DIAGNOSIS — G20 Parkinson's disease: Secondary | ICD-10-CM | POA: Diagnosis present

## 2013-09-29 DIAGNOSIS — M353 Polymyalgia rheumatica: Secondary | ICD-10-CM | POA: Diagnosis present

## 2013-09-29 DIAGNOSIS — F3289 Other specified depressive episodes: Secondary | ICD-10-CM | POA: Diagnosis present

## 2013-09-29 DIAGNOSIS — Z888 Allergy status to other drugs, medicaments and biological substances status: Secondary | ICD-10-CM

## 2013-09-29 DIAGNOSIS — Z96649 Presence of unspecified artificial hip joint: Secondary | ICD-10-CM

## 2013-09-29 HISTORY — PX: REVERSE SHOULDER ARTHROPLASTY: SHX5054

## 2013-09-29 SURGERY — ARTHROPLASTY, SHOULDER, TOTAL, REVERSE
Anesthesia: General | Site: Shoulder | Laterality: Right | Wound class: Clean

## 2013-09-29 MED ORDER — ATORVASTATIN CALCIUM 20 MG PO TABS
20.0000 mg | ORAL_TABLET | Freq: Every day | ORAL | Status: DC
  Administered 2013-09-30 – 2013-10-03 (×4): 20 mg via ORAL
  Filled 2013-09-29 (×5): qty 1

## 2013-09-29 MED ORDER — CLINDAMYCIN HCL 300 MG PO CAPS
300.0000 mg | ORAL_CAPSULE | Freq: Two times a day (BID) | ORAL | Status: DC
  Administered 2013-09-29 – 2013-10-03 (×8): 300 mg via ORAL
  Filled 2013-09-29 (×9): qty 1

## 2013-09-29 MED ORDER — CARBIDOPA-LEVODOPA-ENTACAPONE 37.5-150-200 MG PO TABS: 1.0000 | ORAL_TABLET | Freq: Every day | ORAL | Status: DC

## 2013-09-29 MED ORDER — OXYCODONE-ACETAMINOPHEN 5-325 MG PO TABS: 1.0000 | ORAL_TABLET | ORAL | Status: DC | PRN

## 2013-09-29 MED ORDER — SODIUM CHLORIDE 0.9 % IV SOLN
INTRAVENOUS | Status: DC
  Administered 2013-09-30 (×2): via INTRAVENOUS

## 2013-09-29 MED ORDER — ARIPIPRAZOLE 5 MG PO TABS
5.0000 mg | ORAL_TABLET | Freq: Two times a day (BID) | ORAL | Status: DC
  Administered 2013-09-30 – 2013-10-03 (×7): 5 mg via ORAL
  Filled 2013-09-29 (×9): qty 1

## 2013-09-29 MED ORDER — POVIDONE-IODINE 7.5 % EX SOLN
Freq: Once | CUTANEOUS | Status: AC
  Administered 2013-09-29: 10:00:00 via TOPICAL

## 2013-09-29 MED ORDER — ACETAMINOPHEN 650 MG RE SUPP: 650.0000 mg | Freq: Four times a day (QID) | RECTAL | Status: DC | PRN

## 2013-09-29 MED ORDER — CEFAZOLIN SODIUM 1-5 GM-% IV SOLN
1.0000 g | Freq: Four times a day (QID) | INTRAVENOUS | Status: AC
  Administered 2013-09-29 – 2013-09-30 (×3): 1 g via INTRAVENOUS
  Filled 2013-09-29 (×3): qty 50

## 2013-09-29 MED ORDER — LACTATED RINGERS IV SOLN
Freq: Once | INTRAVENOUS | Status: AC
  Administered 2013-09-29: 10:00:00 via INTRAVENOUS

## 2013-09-29 MED ORDER — ALUMINUM HYDROXIDE GEL 320 MG/5ML PO SUSP
15.0000 mL | ORAL | Status: DC | PRN
  Filled 2013-09-29: qty 30

## 2013-09-29 MED ORDER — FLEET ENEMA 7-19 GM/118ML RE ENEM: 1.0000 | ENEMA | Freq: Once | RECTAL | Status: AC | PRN

## 2013-09-29 MED ORDER — CARBIDOPA-LEVODOPA 25-100 MG PO TABS
1.0000 | ORAL_TABLET | Freq: Four times a day (QID) | ORAL | Status: DC
  Administered 2013-09-29 – 2013-10-03 (×14): 1 via ORAL
  Filled 2013-09-29 (×19): qty 1

## 2013-09-29 MED ORDER — FENTANYL CITRATE 0.05 MG/ML IJ SOLN
INTRAMUSCULAR | Status: DC | PRN
  Administered 2013-09-29 (×2): 50 ug via INTRAVENOUS

## 2013-09-29 MED ORDER — PROPOFOL 10 MG/ML IV BOLUS
INTRAVENOUS | Status: DC | PRN
  Administered 2013-09-29: 100 mg via INTRAVENOUS

## 2013-09-29 MED ORDER — ROPINIROLE HCL 1 MG PO TABS
2.0000 mg | ORAL_TABLET | Freq: Three times a day (TID) | ORAL | Status: DC
  Administered 2013-09-29 – 2013-10-03 (×11): 2 mg via ORAL
  Filled 2013-09-29 (×14): qty 2

## 2013-09-29 MED ORDER — PHENYLEPHRINE HCL 10 MG/ML IJ SOLN
INTRAMUSCULAR | Status: DC | PRN
  Administered 2013-09-29 (×2): 60 ug via INTRAVENOUS

## 2013-09-29 MED ORDER — NEOSTIGMINE METHYLSULFATE 1 MG/ML IJ SOLN
INTRAMUSCULAR | Status: DC | PRN
  Administered 2013-09-29: 3 mg via INTRAVENOUS

## 2013-09-29 MED ORDER — LOPERAMIDE HCL 2 MG PO CAPS
2.0000 mg | ORAL_CAPSULE | ORAL | Status: DC | PRN
  Filled 2013-09-29: qty 1

## 2013-09-29 MED ORDER — EPHEDRINE SULFATE 50 MG/ML IJ SOLN
INTRAMUSCULAR | Status: DC | PRN
  Administered 2013-09-29: 10 mg via INTRAVENOUS

## 2013-09-29 MED ORDER — CARBIDOPA-LEVODOPA 25-100 MG PO TABS
1.5000 | ORAL_TABLET | Freq: Every day | ORAL | Status: DC
  Administered 2013-09-30 – 2013-10-03 (×4): 1.5 via ORAL
  Filled 2013-09-29 (×5): qty 1.5

## 2013-09-29 MED ORDER — SODIUM CHLORIDE 0.9 % IR SOLN
Status: DC | PRN
  Administered 2013-09-29: 3000 mL

## 2013-09-29 MED ORDER — GLYCOPYRROLATE 0.2 MG/ML IJ SOLN
INTRAMUSCULAR | Status: DC | PRN
  Administered 2013-09-29: 0.4 mg via INTRAVENOUS

## 2013-09-29 MED ORDER — METOCLOPRAMIDE HCL 5 MG/ML IJ SOLN: 5.0000 mg | Freq: Three times a day (TID) | INTRAMUSCULAR | Status: DC | PRN

## 2013-09-29 MED ORDER — MENTHOL 3 MG MT LOZG: 1.0000 | LOZENGE | OROMUCOSAL | Status: DC | PRN

## 2013-09-29 MED ORDER — ONDANSETRON HCL 4 MG/2ML IJ SOLN
INTRAMUSCULAR | Status: DC | PRN
  Administered 2013-09-29: 4 mg via INTRAVENOUS

## 2013-09-29 MED ORDER — METOPROLOL TARTRATE 100 MG PO TABS
100.0000 mg | ORAL_TABLET | Freq: Two times a day (BID) | ORAL | Status: DC
  Administered 2013-09-30 – 2013-10-03 (×7): 100 mg via ORAL
  Filled 2013-09-29 (×9): qty 1

## 2013-09-29 MED ORDER — BISACODYL 5 MG PO TBEC
5.0000 mg | DELAYED_RELEASE_TABLET | Freq: Every day | ORAL | Status: DC | PRN
  Administered 2013-10-03: 5 mg via ORAL
  Filled 2013-09-29: qty 1

## 2013-09-29 MED ORDER — DOCUSATE SODIUM 100 MG PO CAPS
100.0000 mg | ORAL_CAPSULE | Freq: Two times a day (BID) | ORAL | Status: DC
  Administered 2013-09-30 – 2013-10-02 (×6): 100 mg via ORAL
  Filled 2013-09-29 (×8): qty 1

## 2013-09-29 MED ORDER — ONDANSETRON HCL 4 MG/2ML IJ SOLN: 4.0000 mg | Freq: Four times a day (QID) | INTRAMUSCULAR | Status: DC | PRN

## 2013-09-29 MED ORDER — METOCLOPRAMIDE HCL 5 MG PO TABS
5.0000 mg | ORAL_TABLET | Freq: Three times a day (TID) | ORAL | Status: DC | PRN
  Filled 2013-09-29: qty 2

## 2013-09-29 MED ORDER — MORPHINE SULFATE 2 MG/ML IJ SOLN: 1.0000 mg | INTRAMUSCULAR | Status: DC | PRN

## 2013-09-29 MED ORDER — RIFAMPIN 300 MG PO CAPS
300.0000 mg | ORAL_CAPSULE | Freq: Two times a day (BID) | ORAL | Status: DC
  Administered 2013-09-30 – 2013-10-03 (×7): 300 mg via ORAL
  Filled 2013-09-29 (×9): qty 1

## 2013-09-29 MED ORDER — ACETAMINOPHEN 325 MG PO TABS: 650.0000 mg | ORAL_TABLET | Freq: Four times a day (QID) | ORAL | Status: DC | PRN

## 2013-09-29 MED ORDER — DIPHENHYDRAMINE HCL 12.5 MG/5ML PO ELIX: 12.5000 mg | ORAL_SOLUTION | ORAL | Status: DC | PRN

## 2013-09-29 MED ORDER — FENTANYL CITRATE 0.05 MG/ML IJ SOLN
50.0000 ug | Freq: Once | INTRAMUSCULAR | Status: AC
  Administered 2013-09-29: 50 ug via INTRAVENOUS

## 2013-09-29 MED ORDER — ENTACAPONE 200 MG PO TABS
200.0000 mg | ORAL_TABLET | Freq: Every day | ORAL | Status: DC
  Administered 2013-09-30 – 2013-10-03 (×4): 200 mg via ORAL
  Filled 2013-09-29 (×5): qty 1

## 2013-09-29 MED ORDER — CLINDAMYCIN HCL 300 MG PO CAPS: 300.0000 mg | ORAL_CAPSULE | Freq: Two times a day (BID) | ORAL | Status: DC

## 2013-09-29 MED ORDER — FENTANYL CITRATE 0.05 MG/ML IJ SOLN
25.0000 ug | INTRAMUSCULAR | Status: DC | PRN
  Administered 2013-09-29 (×2): 25 ug via INTRAVENOUS
  Administered 2013-09-29: 50 ug via INTRAVENOUS

## 2013-09-29 MED ORDER — PHENOL 1.4 % MT LIQD: 1.0000 | OROMUCOSAL | Status: DC | PRN

## 2013-09-29 MED ORDER — POLYETHYLENE GLYCOL 3350 17 G PO PACK: 17.0000 g | PACK | Freq: Every day | ORAL | Status: DC | PRN

## 2013-09-29 MED ORDER — HYDROCODONE-ACETAMINOPHEN 5-325 MG PO TABS
1.0000 | ORAL_TABLET | ORAL | Status: DC | PRN
  Administered 2013-09-29: 1 via ORAL
  Administered 2013-09-30 – 2013-10-03 (×14): 2 via ORAL
  Filled 2013-09-29 (×3): qty 2
  Filled 2013-09-29: qty 1
  Filled 2013-09-29 (×12): qty 2

## 2013-09-29 MED ORDER — LIDOCAINE HCL (CARDIAC) 20 MG/ML IV SOLN
INTRAVENOUS | Status: DC | PRN
  Administered 2013-09-29: 50 mg via INTRAVENOUS

## 2013-09-29 MED ORDER — MIDAZOLAM HCL 5 MG/ML IJ SOLN: 1.0000 mg | Freq: Once | INTRAMUSCULAR | Status: AC

## 2013-09-29 MED ORDER — LACTATED RINGERS IV SOLN
INTRAVENOUS | Status: DC | PRN
  Administered 2013-09-29 (×2): via INTRAVENOUS

## 2013-09-29 MED ORDER — BUPIVACAINE-EPINEPHRINE PF 0.5-1:200000 % IJ SOLN
INTRAMUSCULAR | Status: DC | PRN
  Administered 2013-09-29: 23 mL

## 2013-09-29 MED ORDER — OXYCODONE HCL 5 MG PO TABS: 5.0000 mg | ORAL_TABLET | ORAL | Status: DC | PRN

## 2013-09-29 MED ORDER — MIDAZOLAM HCL 2 MG/2ML IJ SOLN
INTRAMUSCULAR | Status: AC
  Administered 2013-09-29: 1 mg
  Filled 2013-09-29: qty 2

## 2013-09-29 MED ORDER — HYDROMORPHONE HCL PF 1 MG/ML IJ SOLN
1.0000 mg | INTRAMUSCULAR | Status: DC | PRN
  Administered 2013-09-29: 0.5 mg via INTRAVENOUS
  Administered 2013-09-30 – 2013-10-01 (×5): 1 mg via INTRAVENOUS
  Filled 2013-09-29 (×6): qty 1

## 2013-09-29 MED ORDER — ONDANSETRON HCL 4 MG/2ML IJ SOLN: 4.0000 mg | Freq: Once | INTRAMUSCULAR | Status: DC | PRN

## 2013-09-29 MED ORDER — ASPIRIN EC 325 MG PO TBEC
325.0000 mg | DELAYED_RELEASE_TABLET | Freq: Two times a day (BID) | ORAL | Status: DC
  Administered 2013-09-29 – 2013-10-03 (×8): 325 mg via ORAL
  Filled 2013-09-29 (×12): qty 1

## 2013-09-29 MED ORDER — ONDANSETRON HCL 4 MG PO TABS: 4.0000 mg | ORAL_TABLET | Freq: Four times a day (QID) | ORAL | Status: DC | PRN

## 2013-09-29 MED ORDER — ROCURONIUM BROMIDE 100 MG/10ML IV SOLN
INTRAVENOUS | Status: DC | PRN
  Administered 2013-09-29: 40 mg via INTRAVENOUS
  Administered 2013-09-29 (×2): 10 mg via INTRAVENOUS

## 2013-09-29 MED ORDER — FENTANYL CITRATE 0.05 MG/ML IJ SOLN
INTRAMUSCULAR | Status: AC
  Administered 2013-09-29: 14:00:00
  Filled 2013-09-29: qty 2

## 2013-09-29 MED ORDER — FENTANYL CITRATE 0.05 MG/ML IJ SOLN
INTRAMUSCULAR | Status: AC
  Administered 2013-09-29: 50 ug via INTRAVENOUS
  Filled 2013-09-29: qty 2

## 2013-09-29 MED ORDER — ENSURE COMPLETE PO LIQD
237.0000 mL | ORAL | Status: DC
  Administered 2013-09-30 – 2013-10-03 (×4): 237 mL via ORAL
  Filled 2013-09-29 (×4): qty 237

## 2013-09-29 SURGICAL SUPPLY — 71 items
BIT DRILL 170X2.5X (BIT) IMPLANT
BIT DRL 170X2.5X (BIT)
BLADE SAW SAG 73X25 THK (BLADE) ×1
BLADE SAW SGTL 73X25 THK (BLADE) ×1 IMPLANT
BOWL SMART MIX CTS (DISPOSABLE) IMPLANT
CAPT SHOULD DELTAXTEND CEM MOD ×2 IMPLANT
CHLORAPREP W/TINT 26ML (MISCELLANEOUS) ×2 IMPLANT
CLOTH BEACON ORANGE TIMEOUT ST (SAFETY) IMPLANT
COVER SURGICAL LIGHT HANDLE (MISCELLANEOUS) ×2 IMPLANT
DRAPE INCISE IOBAN 66X45 STRL (DRAPES) ×4 IMPLANT
DRAPE SURG 17X23 STRL (DRAPES) ×2 IMPLANT
DRAPE U-SHAPE 47X51 STRL (DRAPES) ×2 IMPLANT
DRILL 2.5 (BIT)
DRSG ADAPTIC 3X8 NADH LF (GAUZE/BANDAGES/DRESSINGS) IMPLANT
DRSG MEPILEX BORDER 4X4 (GAUZE/BANDAGES/DRESSINGS) ×2 IMPLANT
DRSG MEPILEX BORDER 4X8 (GAUZE/BANDAGES/DRESSINGS) ×2 IMPLANT
DRSG PAD ABDOMINAL 8X10 ST (GAUZE/BANDAGES/DRESSINGS) IMPLANT
ELECT BLADE 4.0 EZ CLEAN MEGAD (MISCELLANEOUS)
ELECT REM PT RETURN 9FT ADLT (ELECTROSURGICAL) ×2
ELECTRODE BLDE 4.0 EZ CLN MEGD (MISCELLANEOUS) IMPLANT
ELECTRODE REM PT RTRN 9FT ADLT (ELECTROSURGICAL) ×1 IMPLANT
EVACUATOR 1/8 PVC DRAIN (DRAIN) IMPLANT
GLOVE BIO SURGEON STRL SZ7 (GLOVE) ×4 IMPLANT
GLOVE BIO SURGEON STRL SZ7.5 (GLOVE) ×4 IMPLANT
GLOVE BIOGEL PI IND STRL 7.0 (GLOVE) ×1 IMPLANT
GLOVE BIOGEL PI IND STRL 8 (GLOVE) ×1 IMPLANT
GLOVE BIOGEL PI INDICATOR 7.0 (GLOVE) ×1
GLOVE BIOGEL PI INDICATOR 8 (GLOVE) ×1
GOWN PREVENTION PLUS LG XLONG (DISPOSABLE) ×2 IMPLANT
GOWN PREVENTION PLUS XLARGE (GOWN DISPOSABLE) ×2 IMPLANT
GOWN STRL NON-REIN LRG LVL3 (GOWN DISPOSABLE) IMPLANT
HANDPIECE INTERPULSE COAX TIP (DISPOSABLE) ×1
HEMOSTAT SURGICEL 2X14 (HEMOSTASIS) IMPLANT
HOOD PEEL AWAY FACE SHEILD DIS (HOOD) ×4 IMPLANT
KIT BASIN OR (CUSTOM PROCEDURE TRAY) ×2 IMPLANT
KIT ROOM TURNOVER OR (KITS) ×2 IMPLANT
MANIFOLD NEPTUNE II (INSTRUMENTS) ×2 IMPLANT
NEEDLE HYPO 25GX1X1/2 BEV (NEEDLE) IMPLANT
NEEDLE MAYO TROCAR (NEEDLE) ×2 IMPLANT
NOZZLE PRISM 8.5MM (MISCELLANEOUS) IMPLANT
NS IRRIG 1000ML POUR BTL (IV SOLUTION) ×2 IMPLANT
PACK SHOULDER (CUSTOM PROCEDURE TRAY) ×2 IMPLANT
PAD ARMBOARD 7.5X6 YLW CONV (MISCELLANEOUS) ×4 IMPLANT
PIN METAGLENE 2.5 (PIN) IMPLANT
RETRIEVER SUT HEWSON (MISCELLANEOUS) IMPLANT
SET HNDPC FAN SPRY TIP SCT (DISPOSABLE) ×1 IMPLANT
SLING ARM IMMOBILIZER LRG (SOFTGOODS) ×2 IMPLANT
SLING ARM IMMOBILIZER MED (SOFTGOODS) IMPLANT
SPONGE GAUZE 4X4 12PLY (GAUZE/BANDAGES/DRESSINGS) IMPLANT
SPONGE LAP 18X18 X RAY DECT (DISPOSABLE) ×2 IMPLANT
SPONGE LAP 4X18 X RAY DECT (DISPOSABLE) IMPLANT
STRIP CLOSURE SKIN 1/2X4 (GAUZE/BANDAGES/DRESSINGS) ×2 IMPLANT
SUCTION FRAZIER TIP 10 FR DISP (SUCTIONS) ×2 IMPLANT
SUPPORT WRAP ARM LG (MISCELLANEOUS) ×2 IMPLANT
SUT ETHIBOND 2 OS 4 DA (SUTURE) IMPLANT
SUT ETHIBOND NAB CT1 #1 30IN (SUTURE) ×4 IMPLANT
SUT FIBERWIRE #2 38 T-5 BLUE (SUTURE)
SUT MNCRL AB 4-0 PS2 18 (SUTURE) ×2 IMPLANT
SUT SILK 2 0 TIES 17X18 (SUTURE)
SUT SILK 2-0 18XBRD TIE BLK (SUTURE) IMPLANT
SUT VIC AB 0 CTB1 27 (SUTURE) ×2 IMPLANT
SUT VIC AB 2-0 CT1 27 (SUTURE) ×1
SUT VIC AB 2-0 CT1 TAPERPNT 27 (SUTURE) ×1 IMPLANT
SUTURE FIBERWR #2 38 T-5 BLUE (SUTURE) IMPLANT
SYR CONTROL 10ML LL (SYRINGE) IMPLANT
SYRINGE TOOMEY DISP (SYRINGE) IMPLANT
TOWEL OR 17X24 6PK STRL BLUE (TOWEL DISPOSABLE) ×2 IMPLANT
TOWEL OR 17X26 10 PK STRL BLUE (TOWEL DISPOSABLE) ×2 IMPLANT
TRAY FOLEY CATH 16FRSI W/METER (SET/KITS/TRAYS/PACK) IMPLANT
WATER STERILE IRR 1000ML POUR (IV SOLUTION) ×2 IMPLANT
YANKAUER SUCT BULB TIP NO VENT (SUCTIONS) IMPLANT

## 2013-09-29 NOTE — Anesthesia Procedure Notes (Signed)
Anesthesia Regional Block:  Interscalene brachial plexus block  Pre-Anesthetic Checklist: ,, timeout performed, Correct Patient, Correct Site, Correct Laterality, Correct Procedure, Correct Position, site marked, Risks and benefits discussed,  Surgical consent,  Pre-op evaluation,  At surgeon's request and post-op pain management  Laterality: Right and Upper  Prep: chloraprep       Needles:  Injection technique: Single-shot  Needle Type: Echogenic Needle     Needle Length: 5cm 5 cm Needle Gauge: 21 and 21 G    Additional Needles:  Procedures: ultrasound guided (picture in chart) Interscalene brachial plexus block Narrative:  Start time: 09/29/2013 10:02 AM End time: 09/29/2013 10:09 AM Injection made incrementally with aspirations every 5 mL.  Performed by: Personally  Anesthesiologist: Sheldon Silvan, MD  Interscalene brachial plexus block

## 2013-09-29 NOTE — H&P (Signed)
Sabrina Duncan is an 72 y.o. female.   Chief Complaint: Chronic right shoulder pain and dysfunction HPI: 72 year old female with significant past medical history who has had a prior failed rotator cuff repair on the right side with progressive rotator cuff tear arthropathy and collapse of the humeral head. She has chronic pain in the right shoulder which limits her daily activities her sleep. She has not been able to get her pain under control total medications, injections, physical therapy. She wished to go forward with surgical management.  Past Medical History  Diagnosis Date  . Depressive disorder, not elsewhere classified   . Unspecified osteomyelitis, site unspecified   . Polymyalgia rheumatica   . Paralysis agitans   . Osteoarthrosis, unspecified whether generalized or localized, unspecified site   . Muscle weakness (generalized)   . Difficulty in walking(719.7)   . Parkinson disease   . PMR (polymyalgia rheumatica)   . Coronary atherosclerosis of unspecified type of vessel, native or graft     2007: LAD PCI with a BMS, 80% distal LCX stenosis treated medically. Most recent stress test in 04/2012 showed distal anterio/apical scar? with no ischemia, EF 53%.   [name] Unspecified essential hypertension   . Other and unspecified hyperlipidemia   . Thoracic ascending aortic aneurysm     4.0 cm  . Complication of anesthesia     2013- hallucinations- [facility]. (-spinal anesth. 2013- no problems   redo- hip- L)  . Esophageal reflux     pt. reports that its resolved   . Neuromuscular disorder     parkinson's - [facility] - neurologist   . Cancer     left breast cancer- lumpectomy, chemo, radiation, tamoxifen , SLNBx  . Anemia, unspecified     bld. transfusion- 02/2013    Past Surgical History  Procedure Laterality Date  . Knee surgery Left   . Varicose vein surgery      both legs   . Melanoma excision Right     elbow  . Knee surgery Left   . Hip replacement  Bilateral   . Foot surgery Right 2013  . Rotator cuff repair Right   . Cardiac catheterization  2007    [place], [place]; x1 stent  . Cardiac catheterization  9/14    [facility]  . Breast lumpectomy Left 1997  . Joint replacement      L-hipx5, R hipx2, Lkneex1,   . Tonsillectomy    . Cholecystectomy  2005    open repair - for gangernous   . Peripherally inserted central catheter insertion      for IV antibiotics related to repeated infection, post hip replacement    History reviewed. No pertinent family history. Social History:  reports that she quit smoking about 29 years ago. Her smoking use included Cigarettes. She has a 5 pack-year smoking history. She does not have any smokeless tobacco history on file. She reports that she does not drink alcohol or use illicit drugs.  Allergies:  Allergies  Allergen Reactions  . Adhesive [Tape]   . Isosorbide Dinitrate   . Morphine And Related   . Oxycodone-Acetaminophen   . Sulfa Antibiotics   . Tazobactam     Medications Prior to Admission  Medication Sig Dispense Refill  . acetaminophen (TYLENOL) 500 MG tablet Take 500 mg by mouth as needed for pain.      [name] acetaminophen-codeine (TYLENOL #4) 300-60 MG per tablet Take 1 tablet by mouth every 6 (six) hours as needed for  pain.      [name] ARIPiprazole (ABILIFY) 5 MG tablet Take 5 mg by mouth 2 (two) times daily.       [name] ascorbic acid (VITAMIN C) 250 MG tablet Take 250 mg by mouth daily.      [name] atorvastatin (LIPITOR) 20 MG tablet Take 20 mg by mouth daily.      . carbidopa-levodopa (SINEMET IR) 25-100 MG per tablet Take 1 tablet by mouth 4 (four) times daily.      . carbidopa-levodopa-entacapone (STALEVO) 37.5-150-200 MG per tablet Take 1 tablet by mouth daily.      . clindamycin (CLEOCIN) 300 MG capsule Take 1 capsule (300 mg total) by mouth 2 (two) times daily.  60 capsule  3  . feeding supplement (ENSURE IMMUNE HEALTH) LIQD Take 237 mLs by mouth every morning.      . ferrous fumarate (HEMOCYTE)  325 (106 FE) MG TABS Take 1 tablet by mouth daily.      [name] loperamide (IMODIUM) 2 MG capsule Take 2 mg by mouth as needed for diarrhea or loose stools.      . Menthol, Topical Analgesic, (BIOFREEZE) 4 % GEL Apply topically 2 (two) times daily.      . metoprolol (LOPRESSOR) 100 MG tablet Take 100 mg by mouth 2 (two) times daily.      . rifampin (RIFADIN) 300 MG capsule Take 300 mg by mouth 2 (two) times daily.      [name] rOPINIRole (REQUIP) 2 MG tablet Take 2 mg by mouth 3 (three) times daily.        No results found for this or any previous visit (from the past 48 hour(s)). No results found.  Review of Systems  All other systems reviewed and are negative.    Blood pressure 143/53, pulse 58, temperature 98 F (36.7 C), temperature source Oral, resp. rate 18, SpO2 99.00%. Physical Exam  Constitutional: She is oriented to person, place, and time. She appears well-developed and well-nourished.  HENT:  Head: Atraumatic.  Eyes: EOM are normal.  Cardiovascular: Intact distal pulses.   Respiratory: Effort normal.  Musculoskeletal:  Right shoulder skin is intact. Well-healed previous scars. No warmth erythema or swelling. Severe pain with any attempted range of motion. Distally grossly neurovascularly intact.  Neurological: She is alert and oriented to person, place, and time.  Skin: Skin is warm and dry.  Psychiatric: She has a normal mood and affect.     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 She understands that the risks of surgery are relatively high with her history of infection, or history of Parkinson's disease and her dependency on a walker. However she is miserable and feels she cannot live with her shoulder in his current state. She is willing to go forward with surgery for pain  relief understanding these potential risks. Risks / benefits of surgery discussed Consent on chart  NPO for OR Preop antibiotics   Sie Formisano WILLIAM 09/29/2013, 9:49 AM

## 2013-09-29 NOTE — Assessment & Plan Note (Signed)
Continue treatment with atorvastatin. Lipid and liver profile is recommended once a year.

## 2013-09-29 NOTE — Op Note (Signed)
Procedure(s): REVERSE SHOULDER ARTHROPLASTY Procedure Note  Sabrina Duncan female 72 y.o. 09/29/2013  Procedure(s) and Anesthesia Type:    * REVERSE SHOULDER ARTHROPLASTY - General   Indications:  72 y.o. 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 Pain and dysfunction interfered with quality of life and nonoperative treatment with activity modification, NSAIDS and injections failed.     Surgeon: Mable Paris   Assistants: Damita Lack PA-C [facility] was present and scrubbed throughout the procedure and was essential in positioning, retraction, exposure, and closure)  Anesthesia: General endotracheal anesthesia with preoperative interscalene block given by the attending anesthesiologist    Procedure Detail  REVERSE SHOULDER ARTHROPLASTY   Estimated Blood Loss:  200 mL         Drains: 1 medium hemovac  Blood Given: none          Specimens: none        Complications:  * No complications entered in OR log *         Disposition: PACU - hemodynamically stable.         Condition: stable      OPERATIVE FINDINGS:  A DePuy press-fit reverse total shoulder arthroplasty was placed with a  size 10 stem, a 36 glenosphere, and a +3-mm poly insert. The base plate  fixation was very good.  PROCEDURE: The patient was identified in the preoperative holding area  where I personally marked the operative site after verifying site, side,  and procedure with the patient. An interscalene block given by  the attending anesthesiologist in the holding area and the patient was taken back to the operating room where all extremities were  carefully padded in position after general anesthesia was induced. She  was placed in a beach-chair position and the operative upper extremity was  prepped and draped in a standard sterile fashion. An approximately 10-  cm incision was made from the tip of  the coracoid process to the center  point of the humerus at the level of the axilla. Dissection was carried  down through subcutaneous tissues to the level of the cephalic vein  which was taken laterally with the deltoid. The pectoralis major was  retracted medially. The subdeltoid space was developed and the lateral  edge of the conjoined tendon was identified. The undersurface of  conjoined tendon was palpated and the musculocutaneous nerve was not in  the field. Retractor was placed underneath the conjoined and second  retractor was placed lateral into the deltoid. The circumflex humeral  artery and vessels were identified and clamped and coagulated. The  biceps tendon was tenotomized.  The subscapularis was intact and taken down with a peel.  The  joint was then gently externally rotated while the capsule was released  from the humeral neck around to just beyond the 6 o'clock position. At  this point, the joint was dislocated and the humeral head was presented  into the wound. The excessive osteophyte formation was removed with a  large rongeur and the intramedullary canal was then entered with  sequential reamers up to a 12-mm reamer which was felt to be the  appropriate size. The cutting guide was used to make the appropriate  head cut and the head was saved potentially bone grafting.  The proximal metaphyseal reaming guide was then placed and the appropriate size reamer was selected. The metaphyseal bone was then reamed. The trial implant was then placed. The trial was  then left in place while the glenoid was exposed with the arm in an  abducted extended position. The anterior and posterior labrum were  completely excised and the capsule was released circumferentially to  allow for exposure of the glenoid for preparation. The guidepin was  placed using the guide in neutral angulation and the reamer was  placed over the guidepin to ream down to concentric surface. Superior  hand reamer  was used as well. The central drill hole was then made and  stayed within the glenoid vault. The Metaglene was then impacted with  Very good press fit. The superior and inferior screws were then  drilled, measured, and filled with appropriate-sized locking screw  alternatively tightening top and bottom to bring the Metaglene down  tightly against the bone. Posterior and anterior screws were then placed. Fixation was very good.  The humerus was then again exposed and the trial implant was removed. The gleno sphere was placed in the metaphysis of the proximal humerus and then reduced to the base plate.  The glenosphere was then impacted over the [facility] taper and tightened down  with the screw. The size 12 trial stem was placed, but could not be reduced with a +3 trial. Therefore the size 10 trial stem was placed which sat lower and was able to be reduced with a +3 trial. Therefore, final humeral stem was placed press fit with a size 10, which still was able to obtain an excellent press-fit.  The +3 poly-insert was placed in the joint was reduced. It was fairly tight to reduce was reduced and smooth range of motion and no tendency towards instability. The joint was reduced taken through full range of motion and felt to be stable. Soft tissue tension was appropriate. A medium Hemovac drain was placed out underneath the deltoid prior to closure. The joint was then copiously irrigated with pulse  lavage and the wound was then closed. The subscapularis was not repaired.  Skin was closed with 2-0 Vicryl in a deep dermal layer and 4-0  Monocryl for skin closure. Steri-Strips were applied.terile  dressings were then applied as well as a sling. The patient was allowed  to awaken from general anesthesia, transferred to stretcher, and taken  to recovery room in stable condition.   POSTOPERATIVE PLAN: The patient will be kept in the hospital postoperatively  for pain control and therapy.

## 2013-09-29 NOTE — Assessment & Plan Note (Signed)
Recent cardiac cath showed no obstructive disease. Continue medical therapy. She should be considered at an overall low risk for cardiovascular complications.

## 2013-09-29 NOTE — Anesthesia Postprocedure Evaluation (Signed)
  Anesthesia Post-op Note  Patient: Sabrina Duncan  Procedure(s) Performed: Procedure(s) with comments: REVERSE SHOULDER ARTHROPLASTY (Right) - Right reverse total shoulder  Patient Location: PACU  Anesthesia Type:GA combined with regional for post-op pain  Level of Consciousness: awake, alert  and oriented  Airway and Oxygen Therapy: Patient Spontanous Breathing and Patient connected to nasal cannula oxygen  Post-op Pain: none  Post-op Assessment: Post-op Vital signs reviewed  Post-op Vital Signs: Reviewed  Complications: No apparent anesthesia complications

## 2013-09-29 NOTE — Progress Notes (Signed)
Per Dr. Ivin Booty, no CXR needed DOS

## 2013-09-29 NOTE — Preoperative (Signed)
Beta Blockers   Reason not to administer Beta Blockers:Not Applicable 

## 2013-09-29 NOTE — Assessment & Plan Note (Signed)
This was measured at 4 cm. These can likely be followed with echocardiograms.     

## 2013-09-29 NOTE — Transfer of Care (Signed)
Immediate Anesthesia Transfer of Care Note  Patient: Sabrina Duncan  Procedure(s) Performed: Procedure(s) with comments: REVERSE SHOULDER ARTHROPLASTY (Right) - Right reverse total shoulder  Patient Location: PACU  Anesthesia Type:General  Level of Consciousness: awake, alert  and oriented  Airway & Oxygen Therapy: Patient Spontanous Breathing and Patient connected to nasal cannula oxygen  Post-op Assessment: Report given to PACU RN  Post vital signs: Reviewed  Complications: No apparent anesthesia complications

## 2013-09-29 NOTE — Assessment & Plan Note (Signed)
Blood pressure is well controlled on medications. 

## 2013-09-29 NOTE — Anesthesia Preprocedure Evaluation (Signed)
Anesthesia Evaluation  Patient identified by MRN, date of birth, ID band Patient awake    Reviewed: Allergy & Precautions, H&P , NPO status , Patient's Chart, lab work & pertinent test results, reviewed documented beta blocker date and time   Airway Mallampati: II TM Distance: >3 FB Neck ROM: Full    Dental  (+) Edentulous Upper, Edentulous Lower, Upper Dentures, Lower Dentures and Dental Advisory Given   Pulmonary  breath sounds clear to auscultation        Cardiovascular hypertension, Pt. on medications and Pt. on home beta blockers + CAD Rhythm:Regular Rate:Normal     Neuro/Psych    GI/Hepatic GERD-  Medicated and Controlled,  Endo/Other    Renal/GU      Musculoskeletal   Abdominal   Peds  Hematology   Anesthesia Other Findings   Reproductive/Obstetrics                           Anesthesia Physical Anesthesia Plan  ASA: III  Anesthesia Plan: General   Post-op Pain Management:    Induction: Intravenous  Airway Management Planned: Oral ETT  Additional Equipment:   Intra-op Plan:   Post-operative Plan: Extubation in OR  Informed Consent: I have reviewed the patients History and Physical, chart, labs and discussed the procedure including the risks, benefits and alternatives for the proposed anesthesia with the patient or authorized representative who has indicated his/her understanding and acceptance.   Dental advisory given  Plan Discussed with: CRNA, Anesthesiologist and Surgeon  Anesthesia Plan Comments:         Anesthesia Quick Evaluation

## 2013-09-30 ENCOUNTER — Encounter (HOSPITAL_COMMUNITY): Payer: Self-pay | Admitting: Emergency Medicine

## 2013-09-30 LAB — CBC
HCT: 34 % — ABNORMAL LOW (ref 36.0–46.0)
Hemoglobin: 11.3 g/dL — ABNORMAL LOW (ref 12.0–15.0)
MCH: 32.8 pg (ref 26.0–34.0)
MCHC: 33.2 g/dL (ref 30.0–36.0)
MCV: 98.8 fL (ref 78.0–100.0)
RBC: 3.44 MIL/uL — ABNORMAL LOW (ref 3.87–5.11)
WBC: 6.5 10*3/uL (ref 4.0–10.5)

## 2013-09-30 LAB — BASIC METABOLIC PANEL
BUN: 7 mg/dL (ref 6–23)
CO2: 25 mEq/L (ref 19–32)
Calcium: 8.3 mg/dL — ABNORMAL LOW (ref 8.4–10.5)
GFR calc non Af Amer: >90 mL/min (ref >90)
Glucose, Bld: 120 mg/dL — ABNORMAL HIGH (ref 70–99)

## 2013-09-30 NOTE — Progress Notes (Signed)
UR COMPLETED  

## 2013-09-30 NOTE — Progress Notes (Signed)
PATIENT ID: Sabrina Duncan   1 Day Post-Op Procedure(s) (LRB): REVERSE SHOULDER ARTHROPLASTY (Right)  Subjective: Reports right shoulder pain, difficult getting it under control last night. Has been working on getting set up with nursing home placement after discharge. Ready to work with PT and OT today.   Objective:  Filed Vitals:   09/30/13 0634  BP: 135/70  Pulse: 82  Temp: 97.4 F (36.3 C)  Resp: 20     Awake, alert, orientated R UE dressing c/d/i Drain removed today Wiggles fingers, distally NVI  Labs:   Recent Labs  09/30/13 0500  HGB 11.3*   Recent Labs  09/30/13 0500  WBC 6.5  RBC 3.44*  HCT 34.0*  PLT 170   Recent Labs  09/30/13 0500  NA 138  K 3.7  CL 103  CO2 25  BUN 7  CREATININE 0.46*  GLUCOSE 120*  CALCIUM 8.3*    Assessment and Plan: 1 day s/p reverse TSA Will work with PT and OT on ambulation with walker Will d/c to SNF, likely Sunday/monday, consulted social work to get this underway Continue current pain medicine, ice on shoulder  VTE proph: ASA 325mg  BID

## 2013-09-30 NOTE — Evaluation (Signed)
Physical Therapy Evaluation Patient Details Name: Sabrina Duncan MRN: [id number] DOB: [birth year] Today's Date: 09/30/2013 Time: 4098-1191 PT Time Calculation (min): 29 min  PT Assessment / Plan / Recommendation History of Present Illness  Pt. underwent R TSA for endstage OA.  Pt. with history of multiple joint replacements and parkinsons disease and uses RW on regular basis.  Clinical Impression  Pt. Presents to PT with decrease in her usual functional level due to R shoulder surgery and use of sling, complicated by h/o parkinsonism.  She will need acute PT to initiate addressing her mobility issues and will need SNF level ongoing rehab.  Pt. Was fearful and needed much encouragement and reassurance.  She has R AFO and custom shoes.  The AFO appears too wide for her and at the least needs to be adjusted.  Order obtained by OT to have this looked at by [employer] .      PT Assessment  Patient needs continued PT services    Follow Up Recommendations  SNF;Supervision/Assistance - 24 hour;Supervision for mobility/OOB    Does the patient have the potential to tolerate intense rehabilitation      Barriers to Discharge Decreased caregiver support Pt. came from ALF but will need SNF level care for rehab    Equipment Recommendations  Other (comment) (hemiwalker)    Recommendations for Other Services     Frequency Min 6X/week    Precautions / Restrictions Precautions Precautions: Fall;Shoulder Type of Shoulder Precautions: NWB R shoulder; sling use Shoulder Interventions: Shoulder sling/immobilizer Required Braces or Orthoses: Sling;Other Brace/Splint ([place] shoes with R AFO) Restrictions Weight Bearing Restrictions: Yes RUE Weight Bearing: Non weight bearing   Pertinent Vitals/Pain See vitals tab       Mobility  Bed Mobility Bed Mobility: Supine to Sit;Sitting - Scoot to Edge of Bed Supine to Sit: 1: +2 Total assist;HOB elevated Supine to Sit: Patient Percentage: 30% Sitting  - Scoot to Edge of Bed: 2: Max assist Details for Bed Mobility Assistance: Pt. able to initiate shifting hips and moving LEs to EOB but needed 2 assist and bed pad to complete task Transfers Transfers: Sit to Stand;Stand to Sit Sit to Stand: 1: +2 Total assist;With upper extremity assist;From elevated surface;From bed;From chair/3-in-1;With armrests (L UE assist only) Sit to Stand: Patient Percentage: 30% Stand to Sit: 1: +2 Total assist;With upper extremity assist;Other (comment);To chair/3-in-1;With armrests (L UE only) Stand to Sit: Patient Percentage: 30% Details for Transfer Assistance: Pt. required vc's for technique, hand placment and needed much encouragement and reinforcement due to her fear level.  Manual assist needed to rise and for control of descent with parkinsons impacting mobility significantly. Ambulation/Gait Ambulation/Gait Assistance: 1: +2 Total assist Ambulation/Gait: Patient Percentage: 60% Ambulation Distance (Feet): 8 Feet Assistive device: Hemi-walker Ambulation/Gait Assistance Details: Pt. needed 2 assist for safety, balance  and stability.  She had difficulty initiating taking steps with R > L.   Gait Pattern: Step-to pattern;Decreased step length - right;Decreased step length - left;Decreased hip/knee flexion - right;Decreased hip/knee flexion - left;Decreased dorsiflexion - right;Decreased dorsiflexion - left;Decreased weight shift to right;Decreased weight shift to left;Festinating;Trunk flexed;Narrow base of support Gait velocity: greatly decreased    Exercises     PT Diagnosis: Difficulty walking;Abnormality of gait;Acute pain  PT Problem List: Decreased activity tolerance;Decreased balance;Decreased mobility;Decreased knowledge of use of DME;Decreased knowledge of precautions;Pain;Decreased strength PT Treatment Interventions: DME instruction;Gait training;Functional mobility training;Therapeutic activities;Therapeutic exercise;Balance training;Patient/family  education     PT Goals(Current goals can be found in the care  plan section) Acute Rehab PT Goals Patient Stated Goal: rehab then back to ALF PT Goal Formulation: With patient Time For Goal Achievement: 10/07/13 Potential to Achieve Goals: Good  Visit Information  Last PT Received On: 09/30/13 Assistance Needed: +2 History of Present Illness: Pt. underwent R TSA for endstage OA.  Pt. with history of multiple joint replacements and parkinsons disease and uses RW on regular basis.       Prior Functioning  Home Living Family/patient expects to be discharged to:: Assisted living Home Equipment: Walker - 2 wheels Prior Function Level of Independence: Independent with assistive device(s) Communication Communication: No difficulties    Cognition  Cognition Arousal/Alertness: Awake/alert Behavior During Therapy: WFL for tasks assessed/performed Overall Cognitive Status: Within Functional Limits for tasks assessed    Extremity/Trunk Assessment Upper Extremity Assessment Upper Extremity Assessment: Defer to OT evaluation Lower Extremity Assessment Lower Extremity Assessment: Generalized weakness Cervical / Trunk Assessment Cervical / Trunk Assessment: Kyphotic   Balance Balance Balance Assessed: Yes Static Sitting Balance Static Sitting - Balance Support: Left upper extremity supported;Feet supported Static Sitting - Level of Assistance: 5: Stand by assistance Dynamic Standing Balance Dynamic Standing - Balance Support: Left upper extremity supported;During functional activity Dynamic Standing - Level of Assistance: 3: Mod assist  End of Session PT - End of Session Equipment Utilized During Treatment: Gait belt;Other (comment) (R UE sling) Activity Tolerance: Patient limited by fatigue;Patient limited by pain Patient left: in chair;with call bell/phone within reach;with family/visitor present Nurse Communication: Mobility status;Precautions (need for 2 assist and safety belt,  hemiwalker)  GP     Ferman Hamming 09/30/2013, 9:07 AM Weldon Picking PT Acute Rehab Services [phone] [place] [phone]

## 2013-09-30 NOTE — Evaluation (Signed)
Occupational Therapy Evaluation Patient Details Name: Sabrina Duncan MRN: [id number] DOB: 1941-04-03 Today's Date: 09/30/2013 Time: 1191-4782 OT Time Calculation (min): 37 min  OT Assessment / Plan / Recommendation History of present illness Pt. underwent reverse RTSA for endstage OA.  Pt. with history of multiple joint replacements and parkinsons disease and uses RW on regular basis.   Clinical Impression   This 27 you female admitted and underwent above presents to acute OT with problems below. Will benefit from acute OT with follow up at SNF.    OT Assessment  Patient needs continued OT Services    Follow Up Recommendations  SNF    Barriers to Discharge Decreased caregiver support    Equipment Recommendations  None recommended by OT       Frequency  Min 3X/week    Precautions / Restrictions Precautions Precautions: Fall;Shoulder Type of Shoulder Precautions: NWB R shoulder; sling use Shoulder Interventions: Shoulder sling/immobilizer;Off for dressing/bathing/exercises Required Braces or Orthoses: Sling;Other Brace/Splint ([place] shoes with R AFO) Restrictions Weight Bearing Restrictions: Yes RUE Weight Bearing: Non weight bearing   Pertinent Vitals/Pain 7/10 RUE; repostioned    ADL  Eating/Feeding: Set up Where Assessed - Eating/Feeding: Chair;Bed level Grooming: Moderate assistance Where Assessed - Grooming: Supported sitting Upper Body Bathing: Maximal assistance Where Assessed - Upper Body Bathing: Supported sitting Lower Body Bathing: +1 Total assistance Where Assessed - Lower Body Bathing: Supported sit to stand (additional +1 for standing) Upper Body Dressing: +1 Total assistance Where Assessed - Upper Body Dressing: Supported sitting Lower Body Dressing: +1 Total assistance Where Assessed - Lower Body Dressing: Supported sit to stand (additional +1 for standing) Toilet Transfer: +2 Total assistance Toilet Transfer: Patient Percentage: 30% Doctor, general practice Method: Sit to Barista: Materials engineer and Hygiene: +2 Total assistance Toileting - Clothing Manipulation and Hygiene: Patient Percentage: 0% Where Assessed - Glass blower/designer Manipulation and Hygiene: Standing Equipment Used: Gait belt;Rolling walker (RUE sling, RLE AFO) Transfers/Ambulation Related to ADLs: total A +2 (pt=30%) sit<>stand; 60% ambulation with hemiwalker    OT Diagnosis: Generalized weakness;Acute pain  OT Problem List: Decreased strength;Decreased range of motion;Decreased activity tolerance;Impaired balance (sitting and/or standing);Pain;Impaired UE functional use;Decreased knowledge of precautions;Decreased knowledge of use of DME or AE OT Treatment Interventions: Self-care/ADL training;Therapeutic exercise;Therapeutic activities;Balance training;Patient/family education;DME and/or AE instruction   OT Goals(Current goals can be found in the care plan section) Acute Rehab OT Goals Patient Stated Goal: rehab then back to ALF and rehab OT Goal Formulation: With patient Time For Goal Achievement: 10/07/13 Potential to Achieve Goals: Good  Visit Information  Last OT Received On: 09/30/13 Assistance Needed: +2 PT/OT Co-Evaluation/Treatment: Yes History of Present Illness: Pt. underwent reverse RTSA for endstage OA.  Pt. with history of multiple joint replacements and parkinsons disease and uses RW on regular basis.       Prior Functioning     Home Living Family/patient expects to be discharged to:: Skilled nursing facility Home Equipment: Walker - 2 wheels Prior Function Level of Independence: Independent with assistive device(s) Communication Communication: No difficulties Dominant Hand: Right         Vision/Perception Vision - History Patient Visual Report: No change from baseline   Cognition  Cognition Arousal/Alertness: Awake/alert Behavior During Therapy: WFL for tasks  assessed/performed Overall Cognitive Status: Within Functional Limits for tasks assessed    Extremity/Trunk Assessment Upper Extremity Assessment Upper Extremity Assessment: RUE deficits/detail RUE Deficits / Details: Elbow distally WFL; shoulder no AROM at present RUE Coordination:  decreased gross motor Lower Extremity Assessment Lower Extremity Assessment: Generalized weakness Cervical / Trunk Assessment Cervical / Trunk Assessment: Kyphotic     Mobility Bed Mobility Bed Mobility: Supine to Sit;Sitting - Scoot to Edge of Bed Supine to Sit: 1: +2 Total assist;HOB elevated Supine to Sit: Patient Percentage: 30% Sitting - Scoot to Edge of Bed: 2: Max assist Details for Bed Mobility Assistance: Pt. able to initiate shifting hips and moving LEs to EOB but needed 2 assist and bed pad to complete task Transfers Sit to Stand: 1: +2 Total assist;With upper extremity assist;From elevated surface;From bed;From chair/3-in-1;With armrests (L UE assist only) Sit to Stand: Patient Percentage: 30% Stand to Sit: 1: +2 Total assist;With upper extremity assist;Other (comment);To chair/3-in-1;With armrests (L UE only) Stand to Sit: Patient Percentage: 30% Details for Transfer Assistance: Pt. required vc's for technique, hand placment and needed much encouragement and reinforcement due to her fear level.  Manual assist needed to rise and for control of descent with parkinsons impacting mobility significantly.        Balance Balance Balance Assessed: Yes Static Sitting Balance Static Sitting - Balance Support: Left upper extremity supported;Feet supported Static Sitting - Level of Assistance: 5: Stand by assistance Dynamic Standing Balance Dynamic Standing - Balance Support: Left upper extremity supported;During functional activity Dynamic Standing - Level of Assistance: 3: Mod assist   End of Session OT - End of Session Equipment Utilized During Treatment: Gait belt;Rolling walker Activity  Tolerance: Patient limited by fatigue Patient left: in chair;with call bell/phone within reach;with family/visitor present Nurse Communication: Mobility status       Evette Georges [phone] 09/30/2013, 10:28 AM

## 2013-09-30 NOTE — Progress Notes (Signed)
Patient tearful this morning, stating it was due to a combination of pain and "being scared" of the hospital and of her progressing Parkinson's diagnosis.  Daughter at bedside, providing reassurance.  Nurse administered PRN pain medication and also provided reassurance and support for patient and patient's daughter.  When pain was at a tolerable level for the patient, she expressed that she was no longer so anxious.

## 2013-10-01 NOTE — Progress Notes (Signed)
Pt in good spirits this AM, 2 Days S/P R reverse TSA. Reports pain is much better and she made gains with PT/OT yesterday.   BP 134/84  Pulse 85  Temp(Src) 98.7 F (37.1 C) (Oral)  Resp 18  SpO2 99% Pt sitting up comfortably R sling in place, bandage CDI, NVI, 2+ DRP=BIL, full ROM of R hand/fingers. No increased warmth/swelling. Pt appears very comfortable.  Assessment and Plan:  2 days s/p reverse TSA, right Will cont with PT and OT on ambulation with walker  Will d/c to SNF, likely Sunday/monday, social work addressing  -pt would like [employer] current pain medicine, ice on shoulder  VTE proph: ASA 325mg  BID

## 2013-10-01 NOTE — Progress Notes (Signed)
Physical Therapy Treatment Patient Details Name: Sabrina Duncan MRN: [id number] DOB: 25-Nov-1941 Today's Date: 10/01/2013 Time: 2130-8657 PT Time Calculation (min): 28 min  PT Assessment / Plan / Recommendation  History of Present Illness     PT Comments   Excellent progress noted with gait today.  Pt very fearful of falling.  Right hand support provided during gait to assist with balance.  Follow Up Recommendations  SNF;Supervision/Assistance - 24 hour     Does the patient have the potential to tolerate intense rehabilitation     Barriers to Discharge        Equipment Recommendations  Other (comment) (hemiwalker)    Recommendations for Other Services    Frequency Min 6X/week   Progress towards PT Goals Progress towards PT goals: Progressing toward goals  Plan Current plan remains appropriate    Precautions / Restrictions Precautions Precautions: Fall;Shoulder Shoulder Interventions: Shoulder sling/immobilizer;Off for dressing/bathing/exercises Required Braces or Orthoses: Sling Restrictions RUE Weight Bearing: Non weight bearing   Pertinent Vitals/Pain 6/10    Mobility  Transfers Sit to Stand: 3: Mod assist;From chair/3-in-1;With armrests;With upper extremity assist Stand to Sit: 4: Min assist;To bed;With upper extremity assist Details for Transfer Assistance: verbal cues for sequencing, safety Ambulation/Gait Ambulation/Gait Assistance: 3: Mod assist Ambulation Distance (Feet): 70 Feet Assistive device: Hemi-walker Ambulation/Gait Assistance Details: +2 assist needed for safety, IV pole;  verbal cues for posture, pt with sense of LOB backward will full erect stance Gait Pattern: Step-to pattern;Trunk flexed Gait velocity: decreased    Exercises     PT Diagnosis:    PT Problem List:   PT Treatment Interventions:     PT Goals (current goals can now be found in the care plan section)    Visit Information  Last PT Received On: 10/01/13 Assistance Needed:  +2    Subjective Data      Cognition  Cognition Arousal/Alertness: Awake/alert Behavior During Therapy: WFL for tasks assessed/performed Overall Cognitive Status: Within Functional Limits for tasks assessed    Balance     End of Session PT - End of Session Equipment Utilized During Treatment: Gait belt Activity Tolerance: Patient tolerated treatment well Patient left: in bed;with call bell/phone within reach;Other (comment) (OT in room) Nurse Communication: Mobility status   GP     Ilda Foil 10/01/2013, 2:48 PM  Aida Raider, PT  Office # [phone] Pager [phone]

## 2013-10-01 NOTE — Progress Notes (Signed)
Occupational Therapy Treatment Patient Details Name: Sabrina Duncan MRN: [id number] DOB: Aug 05, 1941 Today's Date: 10/01/2013 Time: 5621-3086 OT Time Calculation (min): 25 min  OT Assessment / Plan / Recommendation  History of present illness  s/p R reverse TSA. Hx of Parkinson's   OT comments  R elbow, wrsit and hand A/AAROM. Edematous RUE. Ice to RUE after session.Educated pt on protocol for reverse TSA. Given written instructions on what she is allowed to do.Worked with pt on doning/doffing sling and began ADL education. Will follow.  Follow Up Recommendations  SNF    Barriers to Discharge       Equipment Recommendations  None recommended by OT    Recommendations for Other Services    Frequency Min 3X/week   Progress towards OT Goals Progress towards OT goals: Progressing toward goals  Plan Discharge plan remains appropriate    Precautions / Restrictions Precautions Precautions: Fall;Shoulder Type of Shoulder Precautions: NWB R shoulder; sling use Shoulder Interventions: Shoulder sling/immobilizer;Off for dressing/bathing/exercises Required Braces or Orthoses: Sling Restrictions Weight Bearing Restrictions: Yes RUE Weight Bearing: Non weight bearing   Pertinent Vitals/Pain no apparent distress     ADL  ADL Comments: Began ADL education. Educated on donning/doffing sling.    OT Diagnosis:    OT Problem List:   OT Treatment Interventions:     OT Goals(current goals can now be found in the care plan section) Acute Rehab OT Goals Patient Stated Goal: rehab then back to ALF and rehab OT Goal Formulation: With patient Time For Goal Achievement: 10/07/13 Potential to Achieve Goals: Good ADL Goals Pt Will Perform Upper Body Bathing: with mod assist;sitting Pt Will Transfer to Toilet: with mod assist;stand pivot transfer;ambulating;bedside commode Pt/caregiver will Perform Home Exercise Program: Right Upper extremity;With minimal assist Additional ADL Goal #1: Pt  will be Mod A to come up to sit EOB with HOB flat and no rail Additional ADL Goal #2: Pt will be able to stand with Min A long enough to be assisted with toileting needs  Visit Information  Last OT Received On: 10/01/13 Assistance Needed: +2 (for standing.ambulation)    Subjective Data      Prior Functioning       Cognition  Cognition Arousal/Alertness: Awake/alert Behavior During Therapy: WFL for tasks assessed/performed Overall Cognitive Status: Within Functional Limits for tasks assessed    Mobility  Bed Mobility Bed Mobility: Sit to Supine Supine to Sit: 2: Max assist Transfers Sit to Stand: 3: Mod assist;From chair/3-in-1;With armrests;With upper extremity assist Stand to Sit: 4: Min assist;To bed;With upper extremity assist Details for Transfer Assistance: verbal cues for sequencing, safety    Exercises  Other Exercises Other Exercises: elbow, wrist and hand A/AAROM   Balance     End of Session OT - End of Session Activity Tolerance: Patient tolerated treatment well Patient left: in bed;with call bell/phone within reach Nurse Communication: Mobility status  GO     Mariem Skolnick,HILLARY 10/01/2013, 3:35 PM [facility], OTR/L  [phone] 10/01/2013

## 2013-10-02 NOTE — Progress Notes (Signed)
Pt continues to be in good spirits. Patient is c/o mod-severe right shoulder pain. Eager to be discharged to [facility] when bed available.   BP 133/64  Pulse 81  Temp(Src) 98.7 F (37.1 C) (Oral)  Resp 18  SpO2 98%  Pt sitting up comfortably R sling in place, bandage CDI, NVI, 2+ DRP=BIL, full ROM of R hand/fingers. No increased warmth/swelling. Pt appears very comfortable.   3 days s/p reverse TSA, right   -Will cont with PT and OT on ambulation with walker   -Will d/c to SNF, likely monday, social work addressing    -pt would like [facility]   -Continue current pain medicine, ice on shoulder PRN  -VTE proph: ASA 325mg  BID

## 2013-10-02 NOTE — Progress Notes (Signed)
Clinical Social Work Department BRIEF PSYCHOSOCIAL ASSESSMENT 10/02/2013  Patient:  Sabrina Duncan, Sabrina Duncan     Account Number:  [id number]     Admit date:  09/29/2013  Clinical Social Worker:  Hendricks Milo  Date/Time:  10/02/2013 02:37 PM  Referred by:  Physician  Date Referred:  10/01/2013 Referred for  SNF Placement   Other Referral:   Interview type:  Patient Other interview type:    PSYCHOSOCIAL DATA Living Status:  FACILITY Admitted from facility:   Level of care:  Assisted Living Primary support name:  Norva Pavlov [phone] Primary support relationship to patient:  CHILD, ADULT Degree of support available:   Supportive.    CURRENT CONCERNS  Other Concerns:    SOCIAL WORK ASSESSMENT / PLAN Clinical Social Worker (CSW) met with patient to discuss short term rehab at a SNF. Patient was agreeable to SNF search in [facility]. Her first choice is [facility] her second choice is [facility]. Patient reported that she lives in [facility] an assisted living facility. CSW and patient discussed insurance and the financial piece of SNF. CSW explained to patient that traditional medicare will cover days 1-20 at 100% and days 21-100 at 80%. Patient verbalized her understanding. Patient stated that her children were coming to visit this afternoon and she would discuss SNF placement with them.   Assessment/plan status:  Psychosocial Support/Ongoing Assessment of Needs Other assessment/ plan:   Information/referral to community resources:   CSW gave patient SNF list.    PATIENT'S/FAMILY'S RESPONSE TO PLAN OF CARE: Patient agreeable to SNF search in [place]. First choice [facility] second choice [facility].

## 2013-10-02 NOTE — Progress Notes (Addendum)
Clinical Social Work Department CLINICAL SOCIAL WORK PLACEMENT NOTE 10/02/2013  Patient:  Sabrina Duncan, Sabrina Duncan  Account Number:  [id number] Admit date:  09/29/2013  Clinical Social Worker:  Jetta Lout, Theresia Majors  Date/time:  10/02/2013 02:47 PM  Clinical Social Work is seeking post-discharge placement for this patient at the following level of care:   SKILLED NURSING   (*CSW will update this form in Epic as items are completed)   10/02/2013  Patient/family provided with [facility] Department of Clinical Social Work's list of facilities offering this level of care within the geographic area requested by the patient (or if unable, by the patient's family).  10/02/2013  Patient/family informed of their freedom to choose among providers that offer the needed level of care, that participate in Medicare, Medicaid or managed care program needed by the patient, have an available bed and are willing to accept the patient.  10/02/2013  Patient/family informed of [facility]' ownership interest in [facility], as well as of the fact that they are under no obligation to receive care at this facility.  PASARR submitted to EDS on 10/02/2013 PASARR number received from EDS on   FL2 transmitted to all facilities in geographic area requested by pt/family on  10/02/2013 FL2 transmitted to all facilities within larger geographic area on   Patient informed that his/her managed care company has contracts with or will negotiate with  certain facilities, including the following:     Patient/family informed of bed offers received:  10/03/2013 Patient chooses bed at [facility] Physician recommends and patient chooses bed at    Patient to be transferred to  on  10/03/2013 Patient to be transferred to facility by Private vehicle   The following physician request were entered in Epic:   Additional Comments: Patient agreeable to SNF search in [place]. Patient reported her first choice  is [facility] and second [employer] development [employer].

## 2013-10-02 NOTE — Progress Notes (Signed)
Physical Therapy Treatment Patient Details Name: Sabrina Duncan MRN: [id number] DOB: April 14, 1941 Today's Date: 10/02/2013 Time: 4098-1191 PT Time Calculation (min): 26 min  PT Assessment / Plan / Recommendation  History of Present Illness     PT Comments   Pt progressing well.  Decreased assist required for mobility and increased gait distance noted.  Pt very anxious about +1 assist for ambulation, requesting +2 for chair follow.  Follow Up Recommendations  SNF;Supervision/Assistance - 24 hour     Does the patient have the potential to tolerate intense rehabilitation     Barriers to Discharge        Equipment Recommendations  Other (comment) (hemiwalker)    Recommendations for Other Services    Frequency Min 6X/week   Progress towards PT Goals Progress towards PT goals: Progressing toward goals  Plan Current plan remains appropriate    Precautions / Restrictions Precautions Precautions: Fall;Shoulder Shoulder Interventions: Shoulder sling/immobilizer;Off for dressing/bathing/exercises Required Braces or Orthoses: Sling Restrictions RUE Weight Bearing: Non weight bearing   Pertinent Vitals/Pain 5/10    Mobility  Bed Mobility Supine to Sit: 3: Mod assist;HOB elevated Sitting - Scoot to Edge of Bed: 3: Mod assist Details for Bed Mobility Assistance: verbal cues for sequencing Transfers Sit to Stand: 3: Mod assist;From elevated surface;With upper extremity assist;From bed Stand to Sit: 4: Min assist;With upper extremity assist;To chair/3-in-1;With armrests Details for Transfer Assistance: verbal cues for sequencing, safety Ambulation/Gait Ambulation/Gait Assistance: 3: Mod assist Ambulation Distance (Feet): 80 Feet Assistive device: Hemi-walker Ambulation/Gait Assistance Details: +2 assist for chair follow (pt comfort), pt very anxious about ambulating with +1 assist; verbal cues for posture Gait Pattern: Step-to pattern;Trunk flexed;Narrow base of support Gait  velocity: decreased    Exercises     PT Diagnosis:    PT Problem List:   PT Treatment Interventions:     PT Goals (current goals can now be found in the care plan section)    Visit Information  Last PT Received On: 10/02/13 Assistance Needed: +2 (for safety)    Subjective Data      Cognition  Cognition Arousal/Alertness: Awake/alert Behavior During Therapy: WFL for tasks assessed/performed Overall Cognitive Status: Within Functional Limits for tasks assessed    Balance     End of Session PT - End of Session Equipment Utilized During Treatment: Gait belt Activity Tolerance: Patient tolerated treatment well Patient left: in chair;with call bell/phone within reach Nurse Communication: Mobility status   GP     Ilda Foil 10/02/2013, 10:13 AM  Aida Raider, PT  Office # [phone] Pager [phone]

## 2013-10-02 NOTE — Progress Notes (Signed)
Occupational Therapy Treatment Patient Details Name: Sabrina Duncan MRN: [id number] DOB: 02-26-1941 Today's Date: 10/02/2013 Time: 1191-4782 OT Time Calculation (min): 27 min  OT Assessment / Plan / Recommendation  History of present illness Pt. underwent reverse RTSA for endstage OA.  Pt. with history of multiple joint replacements and parkinsons disease and uses RW on regular basis.   OT comments  Reviewed information on shoulder handout with patient and daughter in law also present. Performed elbow, wrist, and hand ROM while standing.   Follow Up Recommendations  SNF    Barriers to Discharge       Equipment Recommendations  None recommended by OT    Recommendations for Other Services    Frequency Min 3X/week   Progress towards OT Goals Progress towards OT goals: Progressing toward goals  Plan Discharge plan remains appropriate    Precautions / Restrictions Precautions Precautions: Fall;Shoulder Shoulder Interventions: Shoulder sling/immobilizer;Off for dressing/bathing/exercises Precaution Comments: reviewed information on handout Required Braces or Orthoses: Sling Restrictions Weight Bearing Restrictions: Yes RUE Weight Bearing: Non weight bearing   Pertinent Vitals/Pain Pain 5/10. Nurse notified. Ice applied.     ADL  Toilet Transfer: Moderate assistance Toilet Transfer Method: Sit to stand Toilet Transfer Equipment: Other (comment) (from recliner chair) Equipment Used: Gait belt;Other (comment) (shoulder sling; hemi walker) Transfers/Ambulation Related to ADLs: Mod A for sit to stand and Min A for stand to sit transfer. ADL Comments: Reviewed shoulder handout with patient and family present. Provided education on ADLs and sling as well as precautions. Pt performed UE exercises of elbow, wrist, and hand. Educated she can also be moving her neck around. Pt did assist some with donning sling-Mod/Max A    OT Diagnosis:    OT Problem List:   OT Treatment  Interventions:     OT Goals(current goals can now be found in the care plan section) Acute Rehab OT Goals Patient Stated Goal: not stated OT Goal Formulation: With patient Time For Goal Achievement: 10/07/13 Potential to Achieve Goals: Good ADL Goals Pt Will Perform Upper Body Bathing: with mod assist;sitting Pt Will Transfer to Toilet: with mod assist;stand pivot transfer;ambulating;bedside commode Pt/caregiver will Perform Home Exercise Program: Right Upper extremity;With minimal assist Additional ADL Goal #1: Pt will be Mod A to come up to sit EOB with HOB flat and no rail Additional ADL Goal #2: Pt will be able to stand with Min A long enough to be assisted with toileting needs Additional ADL Goal #3: Pt will be able to assist and independently direct caregiver in how to assist with donning/doffing sling and shirt while maintaining precautions. Additional ADL Goal #4: Pt will be able to independently direct caregivers in positioning of pillows and also positioning of arm/technique when bathing while maintaining precautions.  Visit Information  Last OT Received On: 10/02/13 Assistance Needed: +2 (for safety) History of Present Illness: Pt. underwent reverse RTSA for endstage OA.  Pt. with history of multiple joint replacements and parkinsons disease and uses RW on regular basis.    Subjective Data      Prior Functioning       Cognition  Cognition Arousal/Alertness: Awake/alert Behavior During Therapy: WFL for tasks assessed/performed Overall Cognitive Status: Within Functional Limits for tasks assessed    Mobility  Bed Mobility Bed Mobility: Not assessed Transfers Transfers: Sit to Stand;Stand to Sit Sit to Stand: From chair/3-in-1;3: Mod assist Stand to Sit: 4: Min assist;To chair/3-in-1 Details for Transfer Assistance: heavy Mod A to achieve standing position. Cues to be sure  to maintain precautions. Assist to control descent to chair.    Exercises  Shoulder  Exercises Elbow Flexion: AROM;AAROM;Right;20 reps;Standing Elbow Extension: AROM;AAROM;20 reps;Right;Standing Wrist Flexion: AROM;Right;20 reps;Standing Wrist Extension: AROM;Right;20 reps;Standing Digit Composite Flexion: AROM;Right;20 reps;Standing Composite Extension: AROM;Right;Standing (20 reps) Donning/doffing shirt without moving shoulder:  (educated) Method for sponge bathing under operated UE:  (educated) Donning/doffing sling/immobilizer:  (educated and pt did assist with donning) Correct positioning of sling/immobilizer:  (educated) ROM for elbow, wrist and digits of operated UE: Minimal assistance Sling wearing schedule (on at all times/off for ADL's):  (educated) Proper positioning of operated UE when showering:  (educated) Positioning of UE while sleeping:  (educated)   Balance     End of Session OT - End of Session Equipment Utilized During Treatment: Other (comment) (shoulder sling, hemi walker) Activity Tolerance: Patient tolerated treatment well Patient left: in chair;with call bell/phone within reach;with family/visitor present Nurse Communication: Other (comment) (spoke with her about size of sling and pt concerns)  GO      Earlie Raveling OTR/L [phone] 10/02/2013, 4:41 PM

## 2013-10-03 MED ORDER — HYDROCODONE-ACETAMINOPHEN 5-325 MG PO TABS
1.0000 | ORAL_TABLET | ORAL | Status: DC | PRN
Start: 2013-10-03 — End: 2015-05-27

## 2013-10-03 MED ORDER — DSS 100 MG PO CAPS: 100.0000 mg | ORAL_CAPSULE | Freq: Two times a day (BID) | ORAL | Status: DC

## 2013-10-03 MED ORDER — ASPIRIN 325 MG PO TBEC: 325.0000 mg | DELAYED_RELEASE_TABLET | Freq: Every day | ORAL | Status: DC

## 2013-10-03 NOTE — Progress Notes (Signed)
PATIENT ID: Sabrina Duncan   4 Days Post-Op Procedure(s) (LRB): REVERSE SHOULDER ARTHROPLASTY (Right)  Subjective: Doing well, pain is much better. She is ready to go to a skilled nursing facility. She would prefer [facility].  Objective:  Filed Vitals:   10/03/13 0619  BP: 120/97  Pulse: 80  Temp: 97.9 F (36.6 C)  Resp: 18     Dressing is removed. Her incision is clean dry and intact. No surrounding warmth erythema or swelling. Distally neurovascularly intact.  Labs:  No results found for this basename: HGB,  in the last 72 hoursNo results found for this basename: WBC, RBC, HCT, PLT,  in the last 72 hoursNo results found for this basename: NA, K, CL, CO2, BUN, CREATININE, GLUCOSE, CALCIUM,  in the last 72 hours  Assessment and Plan: Doing well now 4 days after right reverse total shoulder replacement in the setting of Parkinson's disease She has made progress with PT and OT but will require skilled nursing facility Plan discharge to skilled nursing facility when available, hopefully today.  VTE proph: Enteric-coated aspirin twice daily

## 2013-10-03 NOTE — Progress Notes (Signed)
Physical Therapy Treatment Patient Details Name: Sabrina Duncan MRN: [id number] DOB: [birth year] Today's Date: 10/03/2013 Time: 1011-1030 PT Time Calculation (min): 19 min  PT Assessment / Plan / Recommendation  History of Present Illness Pt. underwent reverse RTSA for endstage OA.  Pt. with history of multiple joint replacements and parkinsons disease and uses RW on regular basis.   PT Comments   Pt pleasant & willing to participate in therapy.  Increased ambulation distance but does c/o fatigue quickly & states LE's feel like "water".  Cont with current POC & progress pt as tolerated with activity.     Follow Up Recommendations  SNF;Supervision/Assistance - 24 hour     Does the patient have the potential to tolerate intense rehabilitation     Barriers to Discharge        Equipment Recommendations  Other (comment) (hemiwalker)    Recommendations for Other Services    Frequency Min 6X/week   Progress towards PT Goals Progress towards PT goals: Progressing toward goals  Plan Current plan remains appropriate    Precautions / Restrictions Precautions Precautions: Fall;Shoulder Type of Shoulder Precautions: NWB R shoulder; sling use Shoulder Interventions: Shoulder sling/immobilizer;Off for dressing/bathing/exercises Required Braces or Orthoses: Sling Restrictions Weight Bearing Restrictions: Yes RUE Weight Bearing: Non weight bearing   Pertinent Vitals/Pain 5/10 Rt shoulder "soreness".      Mobility  Bed Mobility Bed Mobility: Not assessed Supine to Sit: With rails;HOB elevated;4: Min assist Sitting - Scoot to Edge of Bed: With rail;4: Min guard Transfers Transfers: Sit to Stand;Stand to Sit Sit to Stand: 3: Mod assist;With upper extremity assist;With armrests;From chair/3-in-1 Stand to Sit: 4: Min assist;With upper extremity assist;With armrests;To chair/3-in-1 Details for Transfer Assistance: (A) to power up to standing & shift weight forwards.    Ambulation/Gait Ambulation/Gait Assistance: 4: Min guard Ambulation Distance (Feet): 100 Feet Assistive device: Hemi-walker Ambulation/Gait Assistance Details: Guarding for safety.  Pt c/o fatigue quickly.  Cues for increased BOS.   Gait Pattern: Step-through pattern;Narrow base of support (decreased step height) Gait velocity: decreased General Gait Details: fatigues quickly Stairs: No Wheelchair Mobility Wheelchair Mobility: No    Exercises Other Exercises Other Exercises: Elbow flexion and extension (20 reps) due to pt with pitting edema 1/2 way down and up from elbow; also educated her on doing this while sitting in recliner as well (can't get full extension, but should still help with edema); contraction and release of  biceps to help with edema and gave her s squeeze ball for her hand.       PT Goals (current goals can now be found in the care plan section) Acute Rehab PT Goals PT Goal Formulation: With patient Time For Goal Achievement: 10/07/13 Potential to Achieve Goals: Good  Visit Information  Last PT Received On: 10/03/13 Assistance Needed: +1 History of Present Illness: Pt. underwent reverse RTSA for endstage OA.  Pt. with history of multiple joint replacements and parkinsons disease and uses RW on regular basis.    Subjective Data      Cognition  Cognition Arousal/Alertness: Awake/alert Behavior During Therapy: WFL for tasks assessed/performed Overall Cognitive Status: Within Functional Limits for tasks assessed    Balance  Balance Balance Assessed: Yes Dynamic Standing Balance Dynamic Standing - Balance Support: Left upper extremity supported Dynamic Standing - Level of Assistance: 4: Min assist Dynamic Standing - Balance Activities:  (performing elbow flexion and extension)  End of Session PT - End of Session Equipment Utilized During Treatment: Gait belt Activity Tolerance: Patient tolerated treatment  well;Patient limited by fatigue Patient left: in  chair;with call bell/phone within reach Nurse Communication: Mobility status   GP     Lara Mulch 10/03/2013, 11:22 AM  Verdell Face, PTA [phone] 10/03/2013

## 2013-10-03 NOTE — Discharge Summary (Signed)
Patient ID: ALAYA IVERSON MRN: [id number] DOB/AGE: [birth year] 70 y.o.  Admit date: 09/29/2013 Discharge date: 10/03/2013  Admission Diagnoses:  Right shoulder rotator cuff tear arthropathy  Discharge Diagnoses:  Same  Past Medical History  Diagnosis Date  . Depressive disorder, not elsewhere classified   . Unspecified osteomyelitis, site unspecified   . Polymyalgia rheumatica   . Paralysis agitans   . Osteoarthrosis, unspecified whether generalized or localized, unspecified site   . Muscle weakness (generalized)   . Difficulty in walking(719.7)   . Parkinson disease   . PMR (polymyalgia rheumatica)   . Coronary atherosclerosis of unspecified type of vessel, native or graft     2007: LAD PCI with a BMS, 80% distal LCX stenosis treated medically. Most recent stress test in 04/2012 showed distal anterio/apical scar? with no ischemia, EF 53%.   [name] Unspecified essential hypertension   . Other and unspecified hyperlipidemia   . Thoracic ascending aortic aneurysm     4.0 cm  . Complication of anesthesia     2013- hallucinations- [facility] (-spinal anesth. 2013- no problems   redo- hip- L)  . Esophageal reflux     pt. reports that its resolved   . Neuromuscular disorder     parkinson's - [facility] - neurologist   . Cancer     left breast cancer- lumpectomy, chemo, radiation, tamoxifen , SLNBx  . Anemia, unspecified     bld. transfusion- 02/2013    Surgeries: Procedure(s): REVERSE SHOULDER ARTHROPLASTY on 09/29/2013   Consultants:    Discharged Condition: Improved  Hospital Course: MORENE CECILIO is an 72 y.o. female who was admitted 09/29/2013 for operative treatment of right shoulder rotator cuff tear arthropathy. Patient has severe unremitting pain that affects sleep, daily activities, and work/hobbies. After pre-op clearance the patient was taken to the operating room on 09/29/2013 and underwent  Procedure(s): REVERSE SHOULDER ARTHROPLASTY.     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 09/28/13 1431 09/29/13 1050       Patient was given sequential compression devices, early ambulation, and chemoprophylaxis to prevent DVT.  Patient benefited maximally from hospital stay and there were no complications.  She has a history of Parkinson's disease and was slow to mobilize. She worked with occupational and physical therapy and was felt to be appropriate for skilled nursing placement.  Recent vital signs: Patient Vitals for the past 24 hrs:  BP Temp Temp src Pulse Resp SpO2  10/03/13 0619 120/97 mmHg 97.9 F (36.6 C) Oral 80 18 98 %  10/02/13 2050 125/57 mmHg 98.1 F (  36.7 C) Oral 92 20 98 %  10/02/13 1345 114/62 mmHg 98 F (36.7 C) Oral 79 16 97 %     Recent laboratory studies: No results found for this basename: WBC, HGB, HCT, PLT, NA, K, CL, CO2, BUN, CREATININE, GLUCOSE, PT, INR, CALCIUM, 2,  in the last 72 hours   Discharge Medications:     Medication List         acetaminophen-codeine 300-60 MG per tablet  Commonly known as:  TYLENOL #4  Take 1 tablet by mouth every 6 (six) hours as needed for pain.     ARIPiprazole 5 MG tablet  Commonly known as:  ABILIFY  Take 5 mg by mouth 2  (two) times daily.     ascorbic acid 250 MG tablet  Commonly known as:  VITAMIN C  Take 250 mg by mouth daily.     aspirin 325 MG EC tablet  Take 1 tablet (325 mg total) by mouth daily.     atorvastatin 20 MG tablet  Commonly known as:  LIPITOR  Take 20 mg by mouth daily.     BIOFREEZE 4 % Gel  Generic drug:  Menthol (Topical Analgesic)  Apply topically 2 (two) times daily.     carbidopa-levodopa 25-100 MG per tablet  Commonly known as:  SINEMET IR  Take 1 tablet by mouth 4 (four) times daily.     carbidopa-levodopa-entacapone 37.5-150-200 MG per tablet  Commonly known as:  STALEVO  Take 1 tablet by mouth daily.     clindamycin 300 MG capsule  Commonly known as:  CLEOCIN  Take 1 capsule (300 mg total) by mouth 2 (two) times daily.     DSS 100 MG Caps  Take 100 mg by mouth 2 (two) times daily.     feeding supplement Liqd  Take 237 mLs by mouth every morning.     HEMOCYTE 325 (106 FE) MG Tabs tablet  Generic drug:  ferrous fumarate  Take 1 tablet by mouth daily.     HYDROcodone-acetaminophen 5-325 MG per tablet  Commonly known as:  NORCO/VICODIN  Take 1-2 tablets by mouth every 4 (four) hours as needed.     loperamide 2 MG capsule  Commonly known as:  IMODIUM  Take 2 mg by mouth as needed for diarrhea or loose stools.     metoprolol 100 MG tablet  Commonly known as:  LOPRESSOR  Take 100 mg by mouth 2 (two) times daily.     rifampin 300 MG capsule  Commonly known as:  RIFADIN  Take 300 mg by mouth 2 (two) times daily.     rOPINIRole 2 MG tablet  Commonly known as:  REQUIP  Take 2 mg by mouth 3 (three) times daily.     TYLENOL 500 MG tablet  Generic drug:  acetaminophen  Take 500 mg by mouth as needed for pain.     venlafaxine XR 150 MG 24 hr capsule  Commonly known as:  EFFEXOR-XR  Take 150 mg by mouth daily.        Diagnostic Studies: Dg Shoulder Right Port  10/26/13   CLINICAL DATA:  Shoulder replacement.  EXAM: PORTABLE RIGHT SHOULDER - 2+ VIEW   COMPARISON:  None.  FINDINGS: Right reversed shoulder arthroplasty appears in satisfactory position on this single projection.  Drain is in place.  Mild central pulmonary vascular prominence. Elevated right hemidiaphragm. Calcified aorta.  IMPRESSION: Right reversed shoulder arthroplasty appears in satisfactory position on this single projection.   Electronically Signed   By:  Bridgett Larsson M.D.   On: 09/29/2013 13:28    Disposition: Final discharge disposition not confirmed      Discharge Orders   Future Appointments Provider Department Dept Phone   10/19/2013 9:45 AM Judyann Munson, MD [facility] [phone]   Future Orders Complete By Expires   Call MD / Call 911  As directed    Comments:     If you experience chest pain or shortness of breath, CALL 911 and be transported to the hospital emergency room.  If you develope a fever above 101 F, pus (white drainage) or increased drainage or redness at the wound, or calf pain, call your surgeon's office.   Constipation Prevention  As directed    Comments:     Drink plenty of fluids.  Prune juice may be helpful.  You may use a stool softener, such as Colace (over the counter) 100 mg twice a day.  Use MiraLax (over the counter) for constipation as needed.   Diet - low sodium heart healthy  As directed    Driving restrictions  As directed    Comments:     No driving for 6 weeks   Increase activity slowly as tolerated  As directed       Follow-up Information   Follow up with Mable Paris, MD. Schedule an appointment as soon as possible for a visit in 2 weeks.   Specialty:  Orthopedic Surgery   Contact information:   [address] [place] [address] [phone]        Signed: Mable Paris 10/03/2013, 7:54 AM

## 2013-10-03 NOTE — Progress Notes (Signed)
Occupational Therapy Treatment Patient Details Name: Sabrina Duncan MRN: [id number] DOB: [birth year] Today's Date: 10/03/2013 Time: 2956-2130 OT Time Calculation (min): 38 min  OT Assessment / Plan / Recommendation  History of present illness Pt. underwent reverse RTSA for endstage OA.  Pt. with history of multiple joint replacements and parkinsons disease and uses RW on regular basis.   OT comments  This 72 yo female s/p above presents to acute OT making progress, moving more confidently than on eval. Noted edema in RUE, that was not present on eval so, really addressed that more today with more exercises and repetitions of exercises without moving shoulder.  Follow Up Recommendations  SNF       Equipment Recommendations  None recommended by OT       Frequency Min 3X/week   Progress towards OT Goals Progress towards OT goals: Progressing toward goals  Plan Discharge plan remains appropriate    Precautions / Restrictions Precautions Precautions: Fall;Shoulder Type of Shoulder Precautions: NWB R shoulder; sling use Shoulder Interventions: Shoulder sling/immobilizer;Off for dressing/bathing/exercises Required Braces or Orthoses: Sling Restrictions Weight Bearing Restrictions: Yes RUE Weight Bearing: Non weight bearing       ADL  Grooming: Denture care;Moderate assistance Where Assessed - Grooming: Unsupported sitting Toilet Transfer: Minimal assistance Toilet Transfer Method: Sit to stand Toilet Transfer Equipment:  (Bed>5 feet to recliner) Equipment Used: Rolling walker;Gait belt (sling RUE) Transfers/Ambulation Related to ADLs: min A for all with Hemi-walker     OT Goals(current goals can now be found in the care plan section)    Visit Information  Last OT Received On: 10/03/13 Assistance Needed: +2 (to follow with chair) History of Present Illness: Pt. underwent reverse RTSA for endstage OA.  Pt. with history of multiple joint replacements and parkinsons disease  and uses RW on regular basis.          Cognition  Cognition Arousal/Alertness: Awake/alert Behavior During Therapy: WFL for tasks assessed/performed Overall Cognitive Status: Within Functional Limits for tasks assessed    Mobility  Bed Mobility Bed Mobility: Supine to Sit;Sitting - Scoot to Edge of Bed Supine to Sit: With rails;HOB elevated;4: Min assist Sitting - Scoot to [place] of Bed: With rail;4: Min guard Transfers Transfers: Sit to Stand;Stand to Sit Sit to Stand: With upper extremity assist;From bed;3: Mod assist Stand to Sit: 4: Min assist;With upper extremity assist;With armrests;To chair/3-in-1    Exercises  Other Exercises Other Exercises: Elbow flexion and extension (20 reps) due to pt with pitting edema 1/2 way down and up from elbow; also educated her on doing this while sitting in recliner as well (can't get full extension, but should still help with edema); contraction and release of  biceps to help with edema and gave her s squeeze ball for her hand.   Balance Balance Balance Assessed: Yes Dynamic Standing Balance Dynamic Standing - Balance Support: Left upper extremity supported Dynamic Standing - Level of Assistance: 4: Min assist Dynamic Standing - Balance Activities:  (performing elbow flexion and extension)   End of Session OT - End of Session Equipment Utilized During Treatment: Gait belt Activity Tolerance: Patient tolerated treatment well Patient left: in chair Nurse Communication:  (Need for Parkinson's meds early for therapy)       Evette Georges [phone] 10/03/2013, 9:39 AM

## 2013-10-03 NOTE — Progress Notes (Signed)
Lynder Parents to be D/C'd Skilled nursing facility per MD order. Discussed with the patient and all questions fully answered.    Medication List         acetaminophen-codeine 300-60 MG per tablet  Commonly known as:  TYLENOL #4  Take 1 tablet by mouth every 6 (six) hours as needed for pain.     ARIPiprazole 5 MG tablet  Commonly known as:  ABILIFY  Take 5 mg by mouth 2 (two) times daily.     ascorbic acid 250 MG tablet  Commonly known as:  VITAMIN C  Take 250 mg by mouth daily.     aspirin 325 MG EC tablet  Take 1 tablet (325 mg total) by mouth daily.     atorvastatin 20 MG tablet  Commonly known as:  LIPITOR  Take 20 mg by mouth daily.     BIOFREEZE 4 % Gel  Generic drug:  Menthol (Topical Analgesic)  Apply topically 2 (two) times daily.     carbidopa-levodopa 25-100 MG per tablet  Commonly known as:  SINEMET IR  Take 1 tablet by mouth 4 (four) times daily.     carbidopa-levodopa-entacapone 37.5-150-200 MG per tablet  Commonly known as:  STALEVO  Take 1 tablet by mouth daily.     clindamycin 300 MG capsule  Commonly known as:  CLEOCIN  Take 1 capsule (300 mg total) by mouth 2 (two) times daily.     DSS 100 MG Caps  Take 100 mg by mouth 2 (two) times daily.     feeding supplement Liqd  Take 237 mLs by mouth every morning.     HEMOCYTE 325 (106 FE) MG Tabs tablet  Generic drug:  ferrous fumarate  Take 1 tablet by mouth daily.     HYDROcodone-acetaminophen 5-325 MG per tablet  Commonly known as:  NORCO/VICODIN  Take 1-2 tablets by mouth every 4 (four) hours as needed.     loperamide 2 MG capsule  Commonly known as:  IMODIUM  Take 2 mg by mouth as needed for diarrhea or loose stools.     metoprolol 100 MG tablet  Commonly known as:  LOPRESSOR  Take 100 mg by mouth 2 (two) times daily.     rifampin 300 MG capsule  Commonly known as:  RIFADIN  Take 300 mg by mouth 2 (two) times daily.     rOPINIRole 2 MG tablet  Commonly known as:  REQUIP  Take 2 mg  by mouth 3 (three) times daily.     TYLENOL 500 MG tablet  Generic drug:  acetaminophen  Take 500 mg by mouth as needed for pain.     venlafaxine XR 150 MG 24 hr capsule  Commonly known as:  EFFEXOR-XR  Take 150 mg by mouth daily.        VVS, Skin clean, dry and intact without evidence of skin break down, no evidence of skin tears noted.  IV catheter discontinued intact. Site without signs and symptoms of complications. Dressing and pressure applied.  An After Visit Summary was printed and given to the patient.  Patient escorted via WC, and D/C home via private auto.  O'BRIEN, Zeta Bucy P  10/03/2013 4:20 PM

## 2013-10-03 NOTE — Progress Notes (Signed)
Clinical social worker assisted with patient discharge to skilled nursing facility, [facility].  CSW addressed all family questions and concerns. CSW copied chart and added all important documents. CSW also set up patient transportation with [employer]. Clinical Social Worker will sign off for now as social work intervention is no longer needed.   Sabino Niemann, MSW, [employer] [phone]

## 2013-10-19 ENCOUNTER — Ambulatory Visit: Payer: Medicare Other | Admitting: Internal Medicine

## 2013-11-10 ENCOUNTER — Ambulatory Visit: Payer: Medicare Other | Admitting: Internal Medicine

## 2013-11-15 ENCOUNTER — Ambulatory Visit (INDEPENDENT_AMBULATORY_CARE_PROVIDER_SITE_OTHER): Payer: Medicare Other | Admitting: Internal Medicine

## 2013-11-15 ENCOUNTER — Encounter: Payer: Self-pay | Admitting: Internal Medicine

## 2013-11-15 VITALS — BP 131/80 | HR 67 | Temp 97.7°F

## 2013-11-15 DIAGNOSIS — T8450XS Infection and inflammatory reaction due to unspecified internal joint prosthesis, sequela: Secondary | ICD-10-CM

## 2013-11-15 DIAGNOSIS — T889XXS Complication of surgical and medical care, unspecified, sequela: Secondary | ICD-10-CM

## 2013-11-15 LAB — COMPLETE METABOLIC PANEL WITH GFR
AST: 16 U/L (ref 0–37)
Albumin: 3.7 g/dL (ref 3.5–5.2)
Alkaline Phosphatase: 172 U/L — ABNORMAL HIGH (ref 39–117)
Chloride: 107 mEq/L (ref 96–112)
Creat: 0.58 mg/dL (ref 0.50–1.10)
GFR, Est Non African American: >89 mL/min
Glucose, Bld: 118 mg/dL — ABNORMAL HIGH (ref 70–99)
Potassium: 4 mEq/L (ref 3.5–5.3)
Sodium: 142 mEq/L (ref 135–145)
Total Protein: 6.6 g/dL (ref 6.0–8.3)

## 2013-11-15 NOTE — Progress Notes (Signed)
Subjective:    Patient ID: Sabrina Duncan, female    DOB: [birth year], 72 y.o.   MRN: [id number]  HPI 72 year old female with h/o parkisons, on sinemet, coronary artery disease with previous bare-metal stent placement to the left anterior descending artery in 2007.and numerous orthopedic surgeries c/b joint infections. 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 She was first seen at end of July at [facility]. Inflammatory markers at that time were sed rate of 71 and crp 0.5.  She states that her right shoulder is very stiff and painful to move. She had right rotator cuff arthroplasty on 10/9th. Surgery went well. No complication with infection. Doing well with physical therapy. She was seen a few days prior to her admission, when her labs revealed.  Sed rate 93 and CRP<0.5 on that visit. She remains on clindamycin and rifampin since April 2014. Now at the 8th month mark of prolonged antibiotics. Last seen at orthopedist last Friday and returning in 6 wks.  She is still planning on going back to [place] at the end of January for removal of right hip arthroplasty and replace with new hip due to the current one having defect of cobalt leaching. She states that she regularly gets her levels checked to report to orthopedics in [place].  Current Outpatient Prescriptions on File Prior to Visit  Medication Sig Dispense Refill  . acetaminophen (TYLENOL) 500 MG tablet Take 500 mg by mouth as needed for pain.      [name] acetaminophen-codeine (TYLENOL #4) 300-60 MG per tablet Take 1 tablet by mouth every 6 (six) hours as needed for pain.      . ARIPiprazole (ABILIFY) 5 MG tablet Take 5 mg by mouth 2 (two) times daily.       [name] ascorbic acid (VITAMIN C) 250 MG tablet Take 250 mg by mouth daily.      [name] aspirin EC 325 MG EC tablet Take 1 tablet (325 mg total)  by mouth daily.  30 tablet  0  . atorvastatin (LIPITOR) 20 MG tablet Take 20 mg by mouth daily.      . carbidopa-levodopa (SINEMET IR) 25-100 MG per tablet Take 1 tablet by mouth 4 (four) times daily.      . carbidopa-levodopa-entacapone (STALEVO) 37.5-150-200 MG per tablet Take 1 tablet by mouth daily.      . clindamycin (CLEOCIN) 300 MG capsule Take 1 capsule (300 mg total) by mouth 2 (two) times daily.  60 capsule  3  . docusate sodium 100 MG CAPS Take 100 mg by mouth 2 (two) times daily.  30 capsule  0  . feeding supplement (ENSURE IMMUNE HEALTH) LIQD Take 237 mLs by mouth every morning.      . ferrous fumarate (HEMOCYTE) 325 (106 FE) MG TABS Take 1 tablet by mouth daily.      [name] HYDROcodone-acetaminophen (NORCO/VICODIN) 5-325 MG per tablet Take 1-2 tablets by mouth every 4 (four) hours as needed.  50 tablet  0  . loperamide (IMODIUM) 2 MG capsule Take 2 mg by mouth as needed for diarrhea or loose stools.      . Menthol, Topical Analgesic, (BIOFREEZE) 4 % GEL Apply topically 2 (two) times daily.      . metoprolol (LOPRESSOR) 100 MG tablet Take 100 mg by mouth 2 (two) times daily.      [name]  rifampin (RIFADIN) 300 MG capsule Take 300 mg by mouth 2 (two) times daily.      [name] rOPINIRole (REQUIP) 2 MG tablet Take 2 mg by mouth 3 (three) times daily.      [name] venlafaxine XR (EFFEXOR-XR) 150 MG 24 hr capsule Take 150 mg by mouth daily.       No current facility-administered medications on file prior to visit.   Active Ambulatory Problems    Diagnosis Date Noted  . Coronary atherosclerosis of unspecified type of vessel, native or graft   . Unspecified essential hypertension   . Other and unspecified hyperlipidemia   . Thoracic ascending aortic aneurysm    Resolved Ambulatory Problems    Diagnosis Date Noted  . No Resolved Ambulatory Problems   Past Medical History  Diagnosis Date  . Depressive disorder, not elsewhere classified   . Unspecified osteomyelitis, site unspecified   . Polymyalgia  rheumatica   . Paralysis agitans   . Osteoarthrosis, unspecified whether generalized or localized, unspecified site   . Muscle weakness (generalized)   . Difficulty in walking(719.7)   . Parkinson disease   . PMR (polymyalgia rheumatica)   . Complication of anesthesia   . Esophageal reflux   . Neuromuscular disorder   . Cancer   . Anemia, unspecified    History  Substance Use Topics  . Smoking status: Former Smoker -- 0.50 packs/day for 10 years    Types: Cigarettes    Quit date: 09/26/1984  . Smokeless tobacco: Not on file  . Alcohol Use: Yes     Comment: rarely  - still living in assisted living  Review of Systems     Objective:   Physical Exam BP 131/80  Pulse 67  Temp(Src) 97.7 F (36.5 C) (Oral) Physical Exam  Constitutional: oriented to person, place, and time.  appears well-developed and well-nourished. No distress. Frail appearing, in wheelchair HENT:  Mouth/Throat: Oropharynx is clear and moist. No oropharyngeal exudate.  Cardiovascular: Normal rate, regular rhythm and normal heart sounds. Exam reveals no gallop and no friction rub.  No murmur heard.  Pulmonary/Chest: Effort normal and breath sounds normal. No respiratory distress.  no wheezes.  Abdominal: Soft. Bowel sounds are normal.  exhibits no distension. There is no tenderness.  Lymphadenopathy:   no cervical adenopathy.  Neurological:  alert and oriented to person, place, and time Ext = limited range of motion at right shoulder.  Skin: Skin is warm and dry. No rash noted. No erythema.  Psychiatric: a normal mood and affect.  behavior is normal.       Assessment & Plan:   THR prosthetic joint infection = will check sed rate and crp. Continue on rifampin and clindamycin. Will check cbc with diff and bmp  Cobalt/chromium hip implant defect = will check cobalt and chromium level for her upcoming orthopedics visit in [place]  rtc in 6 wks

## 2013-11-22 ENCOUNTER — Telehealth: Payer: Self-pay | Admitting: *Deleted

## 2013-11-22 NOTE — Telephone Encounter (Signed)
Patient is calling for lab results, please advise. Sabrina Duncan

## 2013-11-24 NOTE — Telephone Encounter (Signed)
Dr. Drue Second gave patient her lab results. Sabrina Duncan

## 2013-11-25 ENCOUNTER — Telehealth: Payer: Self-pay | Admitting: *Deleted

## 2013-11-25 NOTE — Telephone Encounter (Signed)
Called to get a fax number and contact to fax lab results and was given Sabrina Duncan at [phone]. Advised her will fax the patient recent lab results today.

## 2013-12-10 ENCOUNTER — Emergency Department: Payer: Self-pay | Admitting: Emergency Medicine

## 2013-12-29 ENCOUNTER — Ambulatory Visit: Payer: Medicare Other | Admitting: Internal Medicine

## 2014-01-04 ENCOUNTER — Ambulatory Visit: Payer: Medicare Other | Admitting: Infectious Disease

## 2014-01-21 ENCOUNTER — Emergency Department: Payer: Self-pay | Admitting: Emergency Medicine

## 2014-02-02 ENCOUNTER — Ambulatory Visit: Payer: Medicare Other | Admitting: Internal Medicine

## 2014-02-06 ENCOUNTER — Ambulatory Visit: Payer: Medicare Other | Admitting: Cardiovascular Disease

## 2014-02-13 ENCOUNTER — Ambulatory Visit (INDEPENDENT_AMBULATORY_CARE_PROVIDER_SITE_OTHER): Payer: Medicare Other | Admitting: Cardiovascular Disease

## 2014-02-13 ENCOUNTER — Encounter: Payer: Self-pay | Admitting: Cardiovascular Disease

## 2014-02-13 VITALS — BP 144/78 | HR 74 | Ht 65.0 in | Wt 149.8 lb

## 2014-02-13 DIAGNOSIS — E785 Hyperlipidemia, unspecified: Secondary | ICD-10-CM

## 2014-02-13 DIAGNOSIS — I7121 Aneurysm of the ascending aorta, without rupture: Secondary | ICD-10-CM

## 2014-02-13 DIAGNOSIS — I712 Thoracic aortic aneurysm, without rupture, unspecified: Secondary | ICD-10-CM

## 2014-02-13 DIAGNOSIS — I10 Essential (primary) hypertension: Secondary | ICD-10-CM

## 2014-02-13 DIAGNOSIS — I2510 Atherosclerotic heart disease of native coronary artery without angina pectoris: Secondary | ICD-10-CM

## 2014-02-13 MED ORDER — ASPIRIN EC 81 MG PO TBEC: 81.0000 mg | DELAYED_RELEASE_TABLET | Freq: Every day | ORAL | Status: DC

## 2014-02-13 NOTE — Patient Instructions (Signed)
Start taking Aspirin 81 mg once daily.  Continue other medications.   Your physician wants you to follow-up in: 6 months.  You will receive a reminder letter in the mail two months in advance. If you don't receive a letter, please call our office to schedule the follow-up appointment.

## 2014-02-13 NOTE — Progress Notes (Signed)
Primary care physician: Dr. Laural Benes with [facility]  HPI  This is a 73 year old female who is here for a follow up visit regarding CAD. She moved  from [place] in 2014 to be close to her family. She has known history of coronary artery disease with previous bare-metal stent placement to the left anterior descending artery in 2007. No cardiac events since then.  She suffers from complications related to hip replacement which were recalled. She has recurrent infections with MRSA . Her mobility is limited and she walks with a walker. Functional capacity is limited due to that.  She had worsening exertional dyspnea in September of last year. She underwent a nuclear stress test which showed possible ischemic/previous scar in anterior wall. I proceeded with cardiac cath which showed patient LAD stent with mild disease overall.  She underwent shoulder surgery without complications. She has Parkinson's disease followed by Dr. Manuella Ghazi. This has been worsening. She was complaining of increased fatigue and thus the dose of metoprolol was decreased to 50 mg twice daily. She has been doing reasonably well and denies chest pain or worsening dyspnea.  Allergies  Allergen Reactions  . Adhesive [Tape]   . Isosorbide Dinitrate   . Morphine And Related   . Oxycodone-Acetaminophen   . Sulfa Antibiotics   . Tazobactam      Current Outpatient Prescriptions on File Prior to Visit  Medication Sig Dispense Refill  . acetaminophen (TYLENOL) 500 MG tablet Take 500 mg by mouth as needed for pain.      [name] acetaminophen-codeine (TYLENOL #4) 300-60 MG per tablet Take 1 tablet by mouth every 6 (six) hours as needed for pain.      . ARIPiprazole (ABILIFY) 5 MG tablet Take 5 mg by mouth 2 (two) times daily.       [name] ascorbic acid (VITAMIN C) 250 MG tablet Take 250 mg by mouth daily.      [name] atorvastatin (LIPITOR) 20 MG tablet Take 20 mg by mouth daily.      . clindamycin (CLEOCIN) 300 MG capsule Take 1 capsule (300 mg  total) by mouth 2 (two) times daily.  60 capsule  3  . docusate sodium 100 MG CAPS Take 100 mg by mouth 2 (two) times daily.  30 capsule  0  . ferrous fumarate (HEMOCYTE) 325 (106 FE) MG TABS Take 1 tablet by mouth daily.      [name] HYDROcodone-acetaminophen (NORCO/VICODIN) 5-325 MG per tablet Take 1-2 tablets by mouth every 4 (four) hours as needed.  50 tablet  0  . loperamide (IMODIUM) 2 MG capsule Take 2 mg by mouth as needed for diarrhea or loose stools.      . Menthol, Topical Analgesic, (BIOFREEZE) 4 % GEL Apply topically 2 (two) times daily.      . rifampin (RIFADIN) 300 MG capsule Take 300 mg by mouth 2 (two) times daily.      [name] rOPINIRole (REQUIP) 2 MG tablet Take 2 mg by mouth 3 (three) times daily.      [name] venlafaxine XR (EFFEXOR-XR) 150 MG 24 hr capsule Take 300 mg by mouth daily.        No current facility-administered medications on file prior to visit.     Past Medical History  Diagnosis Date  . Depressive disorder, not elsewhere classified   . Unspecified osteomyelitis, site unspecified   . Polymyalgia rheumatica   . Paralysis agitans   . Osteoarthrosis, unspecified whether generalized or localized, unspecified site   . Muscle weakness (  generalized)   . Difficulty in walking(719.7)   . Parkinson disease   . PMR (polymyalgia rheumatica)   . Coronary atherosclerosis of unspecified type of vessel, native or graft     2007: LAD PCI with a BMS, 80% distal LCX stenosis treated medically. Most recent stress test in 04/2012 showed distal anterio/apical scar? with no ischemia, EF 53%.   [name] Unspecified essential hypertension   . Other and unspecified hyperlipidemia   . Thoracic ascending aortic aneurysm     4.0 cm  . Complication of anesthesia     2013- hallucinations- [facility] (-spinal anesth. 2013- no problems   redo- hip- L)  . Esophageal reflux     pt. reports that its resolved   . Neuromuscular disorder     parkinson's - [facility] - neurologist   .  Cancer     left breast cancer- lumpectomy, chemo, radiation, tamoxifen , SLNBx  . Anemia, unspecified     bld. transfusion- 02/2013     Past Surgical History  Procedure Laterality Date  . Knee surgery Left   . Varicose vein surgery      both legs   . Melanoma excision Right     elbow  . Knee surgery Left   . Hip replacement Bilateral   . Foot surgery Right 2013  . Rotator cuff repair Right   . Cardiac catheterization  2007    [place], [place]; x1 stent  . Cardiac catheterization  9/14    [facility]  . Breast lumpectomy Left 1997  . Joint replacement      L-hipx5, R hipx2, Lkneex1,   . Tonsillectomy    . Cholecystectomy  2005    open repair - for gangernous   . Peripherally inserted central catheter insertion      for IV antibiotics related to repeated infection, post hip replacement  . Reverse shoulder arthroplasty Right 09/29/2013    Procedure: REVERSE SHOULDER ARTHROPLASTY;  Surgeon: Nita Sells, MD;  Location: [facility];  Service: Orthopedics;  Laterality: Right;  Right reverse total shoulder     History reviewed. No pertinent family history.   History   Social History  . Marital Status: Widowed    Spouse Name: N/A    Number of Children: N/A  . Years of Education: N/A   Occupational History  . Not on file.   Social History Main Topics  . Smoking status: Former Smoker -- 0.50 packs/day for 10 years    Types: Cigarettes    Quit date: 09/26/1984  . Smokeless tobacco: Not on file  . Alcohol Use: Yes     Comment: rarely  . Drug Use: No  . Sexual Activity: Not on file   Other Topics Concern  . Not on file   Social History Narrative  . No narrative on file       PHYSICAL EXAM   BP 144/78  Pulse 74  Ht 5\' 5"  (1.651 m)  Wt 149 lb 12 oz (67.926 kg)  BMI 24.92 kg/m2 Constitutional: She is oriented to person, place, and time. She appears well-developed and well-nourished. No distress.  HENT: No nasal discharge.  Head: Normocephalic and atraumatic.    Eyes: Pupils are equal and round. Right eye exhibits no discharge. Left eye exhibits no discharge.  Neck: Normal range of motion. Neck supple. No JVD present. No thyromegaly present.  Cardiovascular: Normal rate, regular rhythm, normal heart sounds. Exam reveals no gallop and no friction rub. No murmur heard.  Pulmonary/Chest: Effort  normal and breath sounds normal. No stridor. No respiratory distress. She has no wheezes. She has no rales. She exhibits no tenderness.  Abdominal: Soft. Bowel sounds are normal. She exhibits no distension. There is no tenderness. There is no rebound and no guarding.  Musculoskeletal: Normal range of motion. She exhibits no edema and no tenderness.  Neurological: She is alert and oriented to person, place, and time. Coordination normal.  Skin: Skin is warm and dry. No rash noted. She is not diaphoretic. No erythema. No pallor.  Psychiatric: She has a normal mood and affect. Her behavior is normal. Judgment and thought content normal.    DJS:HFWYO  Rhythm  Voltage criteria for LVH  (S(V1)+R(V6) exceeds 3.50 mV).   With prominent R(V1) and right axis -consider combined ventricular hypertrophy.   -Nonspecific ST depression  -Seen with biventricular hypertrophy (strain) or digitalis effect.    ASSESSMENT AND PLAN

## 2014-02-14 ENCOUNTER — Encounter: Payer: Self-pay | Admitting: Cardiovascular Disease

## 2014-02-14 NOTE — Assessment & Plan Note (Signed)
Continue treatment with atorvastatin with a target LDL of less than 70. 

## 2014-02-14 NOTE — Assessment & Plan Note (Signed)
She seems to be stable from a cardiac standpoint with no symptoms suggestive of angina. Continue medical therapy. The dose of metoprolol was decreased due to fatigue. Blood pressure seems to be reasonably controlled on current dose which will be continued. I asked her to resume taking low dose aspirin 81 mg once daily.

## 2014-02-14 NOTE — Assessment & Plan Note (Signed)
This was measured at 4 cm. These can likely be followed with echocardiograms.

## 2014-02-14 NOTE — Assessment & Plan Note (Signed)
Blood pressure is reasonably controlled even after decreasing the dose of metoprolol recently.

## 2014-10-02 ENCOUNTER — Ambulatory Visit (INDEPENDENT_AMBULATORY_CARE_PROVIDER_SITE_OTHER): Payer: Medicare Other | Admitting: Cardiovascular Disease

## 2014-10-02 ENCOUNTER — Encounter: Payer: Self-pay | Admitting: Cardiovascular Disease

## 2014-10-02 VITALS — BP 120/72 | HR 75 | Ht 66.0 in | Wt 143.5 lb

## 2014-10-02 DIAGNOSIS — I10 Essential (primary) hypertension: Secondary | ICD-10-CM

## 2014-10-02 DIAGNOSIS — I7121 Aneurysm of the ascending aorta, without rupture: Secondary | ICD-10-CM

## 2014-10-02 DIAGNOSIS — I2510 Atherosclerotic heart disease of native coronary artery without angina pectoris: Secondary | ICD-10-CM

## 2014-10-02 DIAGNOSIS — I712 Thoracic aortic aneurysm, without rupture: Secondary | ICD-10-CM

## 2014-10-02 NOTE — Assessment & Plan Note (Signed)
This was measured at 4 cm. These can likely be followed with echocardiograms. However, with worsening Parkinson's and overall condition, I will hold off on further surveillance.

## 2014-10-02 NOTE — Assessment & Plan Note (Signed)
She is stable from a cardiac standpoint with no symptoms suggestive of angina. Continue medical therapy. 

## 2014-10-02 NOTE — Patient Instructions (Signed)
Continue same medications.   Your physician wants you to follow-up in: 6 months.  You will receive a reminder letter in the mail two months in advance. If you don't receive a letter, please call our office to schedule the follow-up appointment.  

## 2014-10-02 NOTE — Assessment & Plan Note (Signed)
Continue treatment with atorvastatin with a target LDL of less than 70. 

## 2014-10-02 NOTE — Assessment & Plan Note (Signed)
Blood pressure is controlled even after decreasing the dose of metoprolol.

## 2014-10-02 NOTE — Progress Notes (Signed)
HPI  This is a 73 year old female who is here for a follow up visit regarding CAD. She moved  from [place] in 2014 to be close to her family. She has known history of coronary artery disease with previous bare-metal stent placement to the left anterior descending artery in 2007. No cardiac events since then.  She suffers from complications related to hip replacement which were recalled. She has recurrent infections with MRSA . Her mobility is limited and she walks with a walker. Functional capacity is limited due to that.  She had worsening exertional dyspnea in September of 2014. She underwent a nuclear stress test which showed possible ischemic/previous scar in anterior wall. I proceeded with cardiac cath which showed patient LAD stent with mild disease overall.  Unfortunately, Parkinson's disease has been getting worse with continued weight loss. She denies chest pain or significant dyspnea.   Allergies  Allergen Reactions  . Adhesive [Tape]   . Isosorbide Dinitrate   . Morphine     Other reaction(s): Other (See Comments) questionable  . Morphine And Related   . Oxycodone-Acetaminophen   . Sulfa Antibiotics   . Tazobactam      Current Outpatient Prescriptions on File Prior to Visit  Medication Sig Dispense Refill  . ARIPiprazole (ABILIFY) 5 MG tablet Take 5 mg by mouth 2 (two) times daily.       [name] aspirin EC 81 MG tablet Take 1 tablet (81 mg total) by mouth daily.  90 tablet  3  . atorvastatin (LIPITOR) 20 MG tablet Take 20 mg by mouth daily.      . carbidopa-levodopa (SINEMET CR) 50-200 MG per tablet Take 2 tablets by mouth 4 (four) times daily.       [name] CARBIDOPA-LEVODOPA-ENTACAPONE PO Take 200 mg by mouth daily.      . clindamycin (CLEOCIN) 300 MG capsule Take 1 capsule (300 mg total) by mouth 2 (two) times daily.  60 capsule  3  . Cyanocobalamin (B-12) 1000 MCG CAPS Take by mouth daily.      [name] docusate sodium 100 MG CAPS Take 100 mg by mouth 2 (two) times daily.  30 capsule  0    . HYDROcodone-acetaminophen (NORCO/VICODIN) 5-325 MG per tablet Take 1-2 tablets by mouth every 4 (four) hours as needed.  50 tablet  0  . metoprolol (LOPRESSOR) 50 MG tablet Take 50 mg by mouth 2 (two) times daily.       . rifampin (RIFADIN) 300 MG capsule Take 300 mg by mouth 2 (two) times daily.      [name] rOPINIRole (REQUIP) 2 MG tablet Take 2 mg by mouth 3 (three) times daily.      [name] venlafaxine XR (EFFEXOR-XR) 150 MG 24 hr capsule Take 150 mg by mouth 2 (two) times daily.        No current facility-administered medications on file prior to visit.     Past Medical History  Diagnosis Date  . Depressive disorder, not elsewhere classified   . Unspecified osteomyelitis, site unspecified   . Polymyalgia rheumatica   . Paralysis agitans   . Osteoarthrosis, unspecified whether generalized or localized, unspecified site   . Muscle weakness (generalized)   . Difficulty in walking(719.7)   . Parkinson disease   . PMR (polymyalgia rheumatica)   . Coronary atherosclerosis of unspecified type of vessel, native or graft     2007: LAD PCI with a BMS, 80% distal LCX stenosis treated medically. Most recent stress test in 04/2012 showed distal anterio/apical scar?  with no ischemia, EF 53%.   [name] Unspecified essential hypertension   . Other and unspecified hyperlipidemia   . Thoracic ascending aortic aneurysm     4.0 cm  . Complication of anesthesia     2013- hallucinations- [facility]. (-spinal anesth. 2013- no problems   redo- hip- L)  . Esophageal reflux     pt. reports that its resolved   . Neuromuscular disorder     parkinson's - [facility] - neurologist   . Cancer     left breast cancer- lumpectomy, chemo, radiation, tamoxifen , SLNBx  . Anemia, unspecified     bld. transfusion- 02/2013     Past Surgical History  Procedure Laterality Date  . Knee surgery Left   . Varicose vein surgery      both legs   . Melanoma excision Right     elbow  . Knee surgery Left   .  Hip replacement Bilateral   . Foot surgery Right 2013  . Rotator cuff repair Right   . Cardiac catheterization  2007    [place], [place]; x1 stent  . Cardiac catheterization  9/14    [facility]  . Breast lumpectomy Left 1997  . Joint replacement      L-hipx5, R hipx2, Lkneex1,   . Tonsillectomy    . Cholecystectomy  2005    open repair - for gangernous   . Peripherally inserted central catheter insertion      for IV antibiotics related to repeated infection, post hip replacement  . Reverse shoulder arthroplasty Right 09/29/2013    Procedure: REVERSE SHOULDER ARTHROPLASTY;  Surgeon: Nita Sells, MD;  Location: [facility];  Service: Orthopedics;  Laterality: Right;  Right reverse total shoulder     History reviewed. No pertinent family history.   History   Social History  . Marital Status: Widowed    Spouse Name: N/A    Number of Children: N/A  . Years of Education: N/A   Occupational History  . Not on file.   Social History Main Topics  . Smoking status: Former Smoker -- 0.50 packs/day for 10 years    Types: Cigarettes    Quit date: 09/26/1984  . Smokeless tobacco: Not on file  . Alcohol Use: Yes     Comment: rarely  . Drug Use: No  . Sexual Activity: Not on file   Other Topics Concern  . Not on file   Social History Narrative  . No narrative on file       PHYSICAL EXAM   BP 120/72  Pulse 75  Ht 5\' 6"  (1.676 m)  Wt 143 lb 8 oz (65.091 kg)  BMI 23.17 kg/m2 Constitutional: She is oriented to person, place, and time. She appears well-developed and well-nourished. No distress.  HENT: No nasal discharge.  Head: Normocephalic and atraumatic.  Eyes: Pupils are equal and round. Right eye exhibits no discharge. Left eye exhibits no discharge.  Neck: Normal range of motion. Neck supple. No JVD present. No thyromegaly present.  Cardiovascular: Normal rate, regular rhythm, normal heart sounds. Exam reveals no gallop and no friction rub. No murmur heard.    Pulmonary/Chest: Effort normal and breath sounds normal. No stridor. No respiratory distress. She has no wheezes. She has no rales. She exhibits no tenderness.  Abdominal: Soft. Bowel sounds are normal. She exhibits no distension. There is no tenderness. There is no rebound and no guarding.  Musculoskeletal: Normal range of motion. She exhibits no edema and no tenderness.  Neurological: She is alert and oriented to person, place, and time. Coordination normal.  Skin: Skin is warm and dry. No rash noted. She is not diaphoretic. No erythema. No pallor.  Psychiatric: She has a normal mood and affect. Her behavior is normal. Judgment and thought content normal.    EKG: Sinus  Rhythm  - frequent PAC s  # PACs = 2. Voltage criteria for LVH  (S(V1)+R(V6) exceeds 3.50 mV).   -Nonspecific ST depression  -Seen with left ventricular hypertrophy (strain) or digitalis effect.   ABNORMAL    ASSESSMENT AND PLAN

## 2014-12-29 ENCOUNTER — Ambulatory Visit: Payer: Self-pay

## 2015-01-08 ENCOUNTER — Encounter: Payer: Self-pay | Admitting: Nurse Practitioner

## 2015-01-22 ENCOUNTER — Encounter: Payer: Self-pay | Admitting: Nurse Practitioner

## 2015-02-09 ENCOUNTER — Emergency Department: Payer: Self-pay | Admitting: Emergency Medicine

## 2015-05-08 ENCOUNTER — Other Ambulatory Visit
Admission: RE | Admit: 2015-05-08 | Discharge: 2015-05-08 | Disposition: A | Discharge status: Home / Self Care | Payer: Medicare Other | Source: Ambulatory Visit | Attending: Internal Medicine | Admitting: Internal Medicine

## 2015-05-08 DIAGNOSIS — Z029 Encounter for administrative examinations, unspecified: Secondary | ICD-10-CM | POA: Insufficient documentation

## 2015-05-09 ENCOUNTER — Inpatient Hospital Stay
Admission: EM | Admit: 2015-05-09 | Discharge: 2015-05-14 | DRG: 982 | Disposition: A | Discharge status: Skilled Nursing Facility | Payer: Medicare Other | Attending: Internal Medicine | Admitting: Internal Medicine

## 2015-05-09 ENCOUNTER — Inpatient Hospital Stay: Payer: Medicare Other

## 2015-05-09 ENCOUNTER — Encounter: Payer: Self-pay | Admitting: *Deleted

## 2015-05-09 DIAGNOSIS — Z853 Personal history of malignant neoplasm of breast: Secondary | ICD-10-CM

## 2015-05-09 DIAGNOSIS — Z9221 Personal history of antineoplastic chemotherapy: Secondary | ICD-10-CM | POA: Diagnosis not present

## 2015-05-09 DIAGNOSIS — M869 Osteomyelitis, unspecified: Secondary | ICD-10-CM | POA: Diagnosis present

## 2015-05-09 DIAGNOSIS — Z79899 Other long term (current) drug therapy: Secondary | ICD-10-CM | POA: Diagnosis not present

## 2015-05-09 DIAGNOSIS — E785 Hyperlipidemia, unspecified: Secondary | ICD-10-CM | POA: Diagnosis present

## 2015-05-09 DIAGNOSIS — G20 Parkinson's disease: Secondary | ICD-10-CM | POA: Diagnosis present

## 2015-05-09 DIAGNOSIS — T148 Other injury of unspecified body region: Secondary | ICD-10-CM | POA: Diagnosis present

## 2015-05-09 DIAGNOSIS — Z882 Allergy status to sulfonamides status: Secondary | ICD-10-CM

## 2015-05-09 DIAGNOSIS — Z7982 Long term (current) use of aspirin: Secondary | ICD-10-CM

## 2015-05-09 DIAGNOSIS — Z885 Allergy status to narcotic agent status: Secondary | ICD-10-CM | POA: Diagnosis not present

## 2015-05-09 DIAGNOSIS — Z79891 Long term (current) use of opiate analgesic: Secondary | ICD-10-CM | POA: Diagnosis not present

## 2015-05-09 DIAGNOSIS — F411 Generalized anxiety disorder: Secondary | ICD-10-CM | POA: Diagnosis present

## 2015-05-09 DIAGNOSIS — I2510 Atherosclerotic heart disease of native coronary artery without angina pectoris: Secondary | ICD-10-CM | POA: Diagnosis present

## 2015-05-09 DIAGNOSIS — K219 Gastro-esophageal reflux disease without esophagitis: Secondary | ICD-10-CM | POA: Diagnosis present

## 2015-05-09 DIAGNOSIS — G20A1 Parkinson's disease without dyskinesia, without mention of fluctuations: Secondary | ICD-10-CM | POA: Diagnosis present

## 2015-05-09 DIAGNOSIS — Z87891 Personal history of nicotine dependence: Secondary | ICD-10-CM

## 2015-05-09 DIAGNOSIS — Z8614 Personal history of Methicillin resistant Staphylococcus aureus infection: Secondary | ICD-10-CM

## 2015-05-09 DIAGNOSIS — Z888 Allergy status to other drugs, medicaments and biological substances status: Secondary | ICD-10-CM | POA: Diagnosis not present

## 2015-05-09 DIAGNOSIS — N390 Urinary tract infection, site not specified: Secondary | ICD-10-CM | POA: Diagnosis present

## 2015-05-09 DIAGNOSIS — M1990 Unspecified osteoarthritis, unspecified site: Secondary | ICD-10-CM | POA: Diagnosis present

## 2015-05-09 DIAGNOSIS — F329 Major depressive disorder, single episode, unspecified: Secondary | ICD-10-CM | POA: Diagnosis present

## 2015-05-09 DIAGNOSIS — S7001XA Contusion of right hip, initial encounter: Secondary | ICD-10-CM | POA: Diagnosis present

## 2015-05-09 DIAGNOSIS — D62 Acute posthemorrhagic anemia: Secondary | ICD-10-CM

## 2015-05-09 DIAGNOSIS — M353 Polymyalgia rheumatica: Secondary | ICD-10-CM | POA: Diagnosis present

## 2015-05-09 DIAGNOSIS — G8918 Other acute postprocedural pain: Secondary | ICD-10-CM

## 2015-05-09 DIAGNOSIS — Z96649 Presence of unspecified artificial hip joint: Secondary | ICD-10-CM | POA: Diagnosis present

## 2015-05-09 DIAGNOSIS — T148XXA Other injury of unspecified body region, initial encounter: Secondary | ICD-10-CM

## 2015-05-09 DIAGNOSIS — I10 Essential (primary) hypertension: Secondary | ICD-10-CM | POA: Diagnosis present

## 2015-05-09 DIAGNOSIS — M8660 Other chronic osteomyelitis, unspecified site: Secondary | ICD-10-CM | POA: Diagnosis present

## 2015-05-09 DIAGNOSIS — D649 Anemia, unspecified: Secondary | ICD-10-CM | POA: Diagnosis present

## 2015-05-09 LAB — COMPREHENSIVE METABOLIC PANEL
ALK PHOS: 102 U/L (ref 38–126)
ALT: <5 U/L — ABNORMAL LOW (ref 14–54)
AST: 17 U/L (ref 15–41)
Albumin: 3.2 g/dL — ABNORMAL LOW (ref 3.5–5.0)
Anion gap: 8 (ref 5–15)
BILIRUBIN TOTAL: 0.9 mg/dL (ref 0.3–1.2)
BUN: 22 mg/dL — ABNORMAL HIGH (ref 6–20)
CHLORIDE: 101 mmol/L (ref 101–111)
CO2: 26 mmol/L (ref 22–32)
Calcium: 8.4 mg/dL — ABNORMAL LOW (ref 8.9–10.3)
Creatinine, Ser: 0.49 mg/dL (ref 0.44–1.00)
GFR calc Af Amer: >60 mL/min (ref >60)
GFR calc non Af Amer: >60 mL/min (ref >60)
Glucose, Bld: 95 mg/dL (ref 65–99)
POTASSIUM: 4.4 mmol/L (ref 3.5–5.1)
Sodium: 135 mmol/L (ref 135–145)
TOTAL PROTEIN: 4.9 g/dL — AB (ref 6.5–8.1)

## 2015-05-09 LAB — CBC
HCT: 15.8 % — ABNORMAL LOW (ref 35.0–47.0)
Hemoglobin: 5 g/dL — CL (ref 12.0–15.0)
MCH: 28.2 pg (ref 26.0–34.0)
MCHC: 31.7 g/dL — ABNORMAL LOW (ref 32.0–36.0)
MCV: 88.9 fL (ref 80.0–100.0)
PLATELETS: 244 10*3/uL (ref 150–440)
RBC: 1.78 MIL/uL — ABNORMAL LOW (ref 3.80–5.20)
RDW: 23.7 % — AB (ref 11.5–14.5)
WBC: 7 10*3/uL (ref 3.6–11.0)

## 2015-05-09 LAB — PROTIME-INR
INR: 1.13
Prothrombin Time: 14.7 seconds (ref 11.4–15.0)

## 2015-05-09 LAB — SAMPLE TO BLOOD BANK

## 2015-05-09 LAB — APTT: APTT: 30 s (ref 24–36)

## 2015-05-09 MED ORDER — ENTACAPONE 200 MG PO TABS
200.0000 mg | ORAL_TABLET | Freq: Every evening | ORAL | Status: DC
  Administered 2015-05-09 – 2015-05-14 (×4): 200 mg via ORAL
  Filled 2015-05-09 (×5): qty 1

## 2015-05-09 MED ORDER — ATORVASTATIN CALCIUM 20 MG PO TABS
20.0000 mg | ORAL_TABLET | Freq: Every day | ORAL | Status: DC
  Administered 2015-05-10 – 2015-05-14 (×5): 20 mg via ORAL
  Filled 2015-05-09 (×5): qty 1

## 2015-05-09 MED ORDER — NITROFURANTOIN MACROCRYSTAL 50 MG PO CAPS
50.0000 mg | ORAL_CAPSULE | Freq: Two times a day (BID) | ORAL | Status: DC
  Administered 2015-05-09 – 2015-05-14 (×10): 50 mg via ORAL
  Filled 2015-05-09 (×11): qty 1

## 2015-05-09 MED ORDER — METOPROLOL TARTRATE 50 MG PO TABS
50.0000 mg | ORAL_TABLET | Freq: Two times a day (BID) | ORAL | Status: DC
  Administered 2015-05-10 – 2015-05-13 (×6): 50 mg via ORAL
  Filled 2015-05-09 (×9): qty 1

## 2015-05-09 MED ORDER — CARBIDOPA-LEVODOPA ER 50-200 MG PO TBCR
2.0000 | EXTENDED_RELEASE_TABLET | Freq: Four times a day (QID) | ORAL | Status: DC
  Administered 2015-05-10 – 2015-05-14 (×17): 2 via ORAL
  Filled 2015-05-09 (×18): qty 2

## 2015-05-09 MED ORDER — ACETAMINOPHEN 325 MG PO TABS: 650.0000 mg | ORAL_TABLET | Freq: Four times a day (QID) | ORAL | Status: DC | PRN

## 2015-05-09 MED ORDER — SENNA 8.6 MG PO TABS: 1.0000 | ORAL_TABLET | Freq: Every day | ORAL | Status: DC | PRN

## 2015-05-09 MED ORDER — ROPINIROLE HCL 1 MG PO TABS
2.0000 mg | ORAL_TABLET | Freq: Three times a day (TID) | ORAL | Status: DC
  Administered 2015-05-09 – 2015-05-14 (×15): 2 mg via ORAL
  Filled 2015-05-09 (×16): qty 2

## 2015-05-09 MED ORDER — ALBUTEROL SULFATE (2.5 MG/3ML) 0.083% IN NEBU: 2.5000 mg | INHALATION_SOLUTION | RESPIRATORY_TRACT | Status: DC | PRN

## 2015-05-09 MED ORDER — ACETAMINOPHEN 500 MG PO TABS
1000.0000 mg | ORAL_TABLET | Freq: Once | ORAL | Status: AC
  Administered 2015-05-09: 1000 mg via ORAL
  Filled 2015-05-09: qty 2

## 2015-05-09 MED ORDER — HYDROCODONE-ACETAMINOPHEN 5-325 MG PO TABS
1.0000 | ORAL_TABLET | Freq: Three times a day (TID) | ORAL | Status: DC | PRN
  Administered 2015-05-11 – 2015-05-13 (×3): 1 via ORAL
  Filled 2015-05-09 (×3): qty 1

## 2015-05-09 MED ORDER — CARBIDOPA-LEVODOPA-ENTACAPONE 50-200-200 MG PO TABS: 1.0000 | ORAL_TABLET | Freq: Every evening | ORAL | Status: DC

## 2015-05-09 MED ORDER — CARBIDOPA-LEVODOPA 25-100 MG PO TABS
2.0000 | ORAL_TABLET | Freq: Every evening | ORAL | Status: DC
  Administered 2015-05-09 – 2015-05-14 (×4): 2 via ORAL
  Filled 2015-05-09 (×5): qty 2

## 2015-05-09 MED ORDER — ARIPIPRAZOLE 5 MG PO TABS
5.0000 mg | ORAL_TABLET | Freq: Two times a day (BID) | ORAL | Status: DC
  Administered 2015-05-09 – 2015-05-13 (×8): 5 mg via ORAL
  Filled 2015-05-09 (×9): qty 1

## 2015-05-09 MED ORDER — TRAMADOL HCL 50 MG PO TABS
50.0000 mg | ORAL_TABLET | Freq: Four times a day (QID) | ORAL | Status: DC | PRN
  Administered 2015-05-12: 50 mg via ORAL
  Filled 2015-05-09: qty 1

## 2015-05-09 MED ORDER — ACETAMINOPHEN 650 MG RE SUPP: 650.0000 mg | Freq: Four times a day (QID) | RECTAL | Status: DC | PRN

## 2015-05-09 MED ORDER — DOCUSATE SODIUM 100 MG PO CAPS
100.0000 mg | ORAL_CAPSULE | Freq: Two times a day (BID) | ORAL | Status: DC
  Administered 2015-05-09 – 2015-05-14 (×5): 100 mg via ORAL
  Filled 2015-05-09 (×8): qty 1

## 2015-05-09 MED ORDER — RIFAMPIN 300 MG PO CAPS
300.0000 mg | ORAL_CAPSULE | Freq: Two times a day (BID) | ORAL | Status: DC
  Administered 2015-05-10 – 2015-05-14 (×9): 300 mg via ORAL
  Filled 2015-05-09 (×12): qty 1

## 2015-05-09 MED ORDER — CLINDAMYCIN HCL 150 MG PO CAPS
300.0000 mg | ORAL_CAPSULE | Freq: Two times a day (BID) | ORAL | Status: DC
  Administered 2015-05-09 – 2015-05-14 (×10): 300 mg via ORAL
  Filled 2015-05-09 (×10): qty 2

## 2015-05-09 MED ORDER — VENLAFAXINE HCL ER 75 MG PO CP24
150.0000 mg | ORAL_CAPSULE | Freq: Two times a day (BID) | ORAL | Status: DC
  Administered 2015-05-09 – 2015-05-14 (×10): 150 mg via ORAL
  Filled 2015-05-09 (×10): qty 2

## 2015-05-09 MED ORDER — FERROUS FUMARATE 325 (106 FE) MG PO TABS
1.0000 | ORAL_TABLET | Freq: Two times a day (BID) | ORAL | Status: DC
  Administered 2015-05-09 – 2015-05-10 (×3): 106 mg via ORAL
  Administered 2015-05-11: 23:00:00 via ORAL
  Administered 2015-05-11 – 2015-05-14 (×6): 106 mg via ORAL
  Filled 2015-05-09 (×11): qty 1

## 2015-05-09 MED ORDER — VITAMIN B-12 1000 MCG PO TABS
1000.0000 ug | ORAL_TABLET | Freq: Every morning | ORAL | Status: DC
  Administered 2015-05-10 – 2015-05-14 (×5): 1000 ug via ORAL
  Filled 2015-05-09 (×5): qty 1

## 2015-05-09 MED ORDER — SODIUM CHLORIDE 0.9 % IV SOLN: 10.0000 mL/h | Freq: Once | INTRAVENOUS | Status: DC

## 2015-05-09 MED ORDER — VITAMIN D3 25 MCG (1000 UNIT) PO TABS
2000.0000 [IU] | ORAL_TABLET | Freq: Every morning | ORAL | Status: DC
  Administered 2015-05-10 – 2015-05-14 (×5): 2000 [IU] via ORAL
  Filled 2015-05-09 (×10): qty 2

## 2015-05-09 MED ORDER — SODIUM CHLORIDE 0.9 % IV BOLUS (SEPSIS)
1000.0000 mL | Freq: Once | INTRAVENOUS | Status: AC
  Administered 2015-05-09: 22:00:00 1000 mL via INTRAVENOUS

## 2015-05-09 NOTE — H&P (Addendum)
[facility] NAME: Sabrina Duncan    MR#:  [id number]  DATE OF BIRTH:  11/14/1941  DATE OF ADMISSION:  05/09/2015  PRIMARY CARE PHYSICIAN: [facility] house calls  REQUESTING/REFERRING PHYSICIAN: Dr. Dineen Kid  CHIEF COMPLAINT:   Chief Complaint  Patient presents with  . Anemia    states was called to come here because low h/h of 5.5 had right hip replaced on april 4th,, has felt tired    HISTORY OF PRESENT ILLNESS:  Sabrina Duncan  is a 74 y.o. 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 Ola Spurr as her outpatient hemoglobin was low at 5.8. Patient had her hip surgery in [place] about 3 weeks ago. She did her rehabilitation in [place] and is just Coming back to [place] and the 3 days ago. Patient has had been very weak and tired with blurry vision worsening in the last 2 weeks. She has a history of transfusion dependent anemia and usually required transfusion after any of her surgeries. But this time but she did not get any transfusion after her hip surgery. It seems like she was on Lovenox for 2 weeks receiving even at the rehabilitation. 4 days ago she was having worsening swelling and Pain in the right hip region at her surgical site and patient then to the emergency room and a said that she has a hematoma at the site. She was asked to put some ice on. She did not have any follow up x-rays or scans. She flew from [place] at the same day to [place]. Her hip swelling has been about the same.  Patient denies any active bleeding at this time. She has extensive ecchymosis even in the hamstring region of the leg with persistent hematoma of the right hip and neck ecchymosis extending into the leg below the knee. She went to follow up with Dr. Ola Spurr yesterday for her antibiotics which she is on for life for her prosthetic hip MRSA infection. Because of her tiredness labs were ordered and she was noted to have a hemoglobin of 5.8 today. So she was asked to come to the emergency room. Patient denies any chest pain or worsening dyspnea at this time. 2 units of packed RBC has already been ordered by the ER physician. Her guaiac is negative.  PAST MEDICAL HISTORY:   Past Medical History  Diagnosis Date  . Depressive disorder, not elsewhere classified   . Unspecified osteomyelitis, site unspecified   . Polymyalgia rheumatica   . Paralysis agitans   . Osteoarthrosis, unspecified whether generalized or localized, unspecified site   . Muscle weakness (generalized)   . Difficulty in walking(719.7)   . Parkinson disease   . PMR (polymyalgia rheumatica)   . Coronary atherosclerosis of unspecified type of vessel, native or graft     2007: LAD PCI with a BMS, 80% distal LCX stenosis treated medically. Most recent stress test in 04/2012 showed distal anterio/apical scar? with no ischemia, EF 53%.   [name] Unspecified essential hypertension   . Other and unspecified hyperlipidemia   . Thoracic ascending aortic aneurysm  4.0 cm  . Complication of anesthesia     2013- hallucinations- [facility]. (-spinal anesth. 2013- no problems   redo- hip- L)  . Esophageal reflux     pt. reports that its resolved   . Neuromuscular disorder     parkinson's - [facility] - neurologist   . Cancer     left breast cancer- lumpectomy, chemo, radiation, tamoxifen , SLNBx  . Anemia, unspecified     bld. transfusion- 02/2013    PAST SURGICAL HISTORY:   Past Surgical History  Procedure  Laterality Date  . Knee surgery Left   . Varicose vein surgery      both legs   . Melanoma excision Right     elbow  . Knee surgery Left   . Hip replacement Bilateral   . Foot surgery Right 2013  . Rotator cuff repair Right   . Cardiac catheterization  2007    [place], [place]; x1 stent  . Cardiac catheterization  9/14    [facility]  . Breast lumpectomy Left 1997  . Joint replacement      L-hipx5, R hipx2, Lkneex1,   . Tonsillectomy    . Cholecystectomy  2005    open repair - for gangernous   . Peripherally inserted central catheter insertion      for IV antibiotics related to repeated infection, post hip replacement  . Reverse shoulder arthroplasty Right 09/29/2013    Procedure: REVERSE SHOULDER ARTHROPLASTY;  Surgeon: Nita Sells, MD;  Location: [facility];  Service: Orthopedics;  Laterality: Right;  Right reverse total shoulder    SOCIAL HISTORY:   History  Substance Use Topics  . Smoking status: Former Smoker -- 0.50 packs/day for 10 years    Types: Cigarettes    Quit date: 09/26/1984  . Smokeless tobacco: Not on file  . Alcohol Use: Yes     Comment: rarely    FAMILY HISTORY:   Family History  Problem Relation Age of Onset  . Hypertension Mother     DRUG ALLERGIES:   Allergies  Allergen Reactions  . Morphine And Related Other (See Comments)    Reaction:  Hallucinations   . Oxycodone Other (See Comments)    Reaction:  Unknown   . Sulfa Antibiotics Itching  . Tazobactam Other (See Comments)    Reaction:  Unknown   . Adhesive [Tape] Rash  . Isosorbide Rash    REVIEW OF SYSTEMS:   Review of Systems  Constitutional: Positive for malaise/fatigue. Negative for fever, chills and weight loss.  HENT: Negative for ear discharge, ear pain, hearing loss, nosebleeds and tinnitus.   Eyes: Positive for blurred vision. Negative for double vision and photophobia.  Respiratory: Negative for cough, hemoptysis, shortness of breath and wheezing.   Cardiovascular:  Positive for leg swelling. Negative for chest pain, palpitations and orthopnea.  Gastrointestinal: Negative for heartburn, nausea, vomiting, abdominal pain, diarrhea, constipation and melena.  Genitourinary: Positive for frequency. Negative for dysuria, urgency and hematuria.  Musculoskeletal: Positive for myalgias, back pain and joint pain. Negative for neck pain.  Skin: Negative for rash.  Neurological: Positive for sensory change and headaches. Negative for dizziness, tingling, tremors, speech change and focal weakness.       Right leg foot drop and decreased sensation  Endo/Heme/Allergies: Does not bruise/bleed easily.  Psychiatric/Behavioral: Negative for depression.    MEDICATIONS AT HOME:   Prior to Admission medications   Medication Sig Start Date End Date Taking? Authorizing Provider  ARIPiprazole (ABILIFY) 5 MG  tablet Take 5 mg by mouth 2 (two) times daily.    Yes Historical Provider, MD  aspirin 81 MG chewable tablet Chew 81 mg by mouth every morning.    Yes Historical Provider, MD  atorvastatin (LIPITOR) 20 MG tablet Take 20 mg by mouth daily.   Yes Historical Provider, MD  carbidopa-levodopa (SINEMET CR) 50-200 MG per tablet Take 2 tablets by mouth 4 (four) times daily.    Yes Historical Provider, MD  carbidopa-levodopa-entacapone (STALEVO) 50-200-200 MG per tablet Take 1 tablet by mouth every evening.   Yes Historical Provider, MD  Cholecalciferol (VITAMIN D3) 2000 UNITS capsule Take 2,000 Units by mouth every morning.    Yes Historical Provider, MD  clindamycin (CLEOCIN) 300 MG capsule Take 1 capsule (300 mg total) by mouth 2 (two) times daily. 08/24/13  Yes Carlyle Basques, MD  docusate sodium 100 MG CAPS Take 100 mg by mouth 2 (two) times daily. 10/03/13  Yes Tania Ade, MD  Ferrous Fumarate 324 (106 FE) MG TABS Take 1 tablet by mouth 2 (two) times daily.   Yes Historical Provider, MD  HYDROcodone-acetaminophen (NORCO/VICODIN) 5-325 MG per tablet Take 1-2 tablets by mouth  every 4 (four) hours as needed. Patient taking differently: Take 1 tablet by mouth every 8 (eight) hours as needed for severe pain.  10/03/13  Yes Tania Ade, MD  metoprolol (LOPRESSOR) 50 MG tablet Take 50 mg by mouth 2 (two) times daily.    Yes Historical Provider, MD  nitrofurantoin (MACRODANTIN) 50 MG capsule Take 50 mg by mouth 2 (two) times daily. 05/07/15 05/12/15 Yes Historical Provider, MD  rifampin (RIFADIN) 300 MG capsule Take 300 mg by mouth 2 (two) times daily.   Yes Historical Provider, MD  rOPINIRole (REQUIP) 2 MG tablet Take 2 mg by mouth 3 (three) times daily.   Yes Historical Provider, MD  traMADol (ULTRAM) 50 MG tablet Take 50 mg by mouth daily as needed for moderate pain.   Yes Historical Provider, MD  venlafaxine XR (EFFEXOR-XR) 150 MG 24 hr capsule Take 150 mg by mouth 2 (two) times daily.    Yes Historical Provider, MD  vitamin B-12 (CYANOCOBALAMIN) 1000 MCG tablet Take 1,000 mcg by mouth every morning.   Yes Historical Provider, MD  aspirin EC 81 MG tablet Take 1 tablet (81 mg total) by mouth daily. Patient not taking: Reported on 05/09/2015 02/13/14   Wellington Hampshire, MD      VITAL SIGNS:  Blood pressure 101/49, pulse 79, temperature 98.1 F (36.7 C), temperature source Oral, resp. rate 21, height 5\' 6"  (1.676 m), weight 62.596 kg (138 lb), SpO2 91 %.  PHYSICAL EXAMINATION:   Physical Exam  GENERAL:  74 y.o.-year-old patient lying in the bed with no acute distress.  EYES: Pupils equal, round, reactive to light and accommodation. No scleral icterus. Extraocular muscles intact.  HEENT: Head atraumatic, normocephalic. Oropharynx and nasopharynx clear.  NECK:  Supple, no jugular venous distention. No thyroid enlargement, no tenderness.  LUNGS: Normal breath sounds bilaterally, no wheezing, rales,rhonchi or crepitation. No use of accessory muscles of respiration.  CARDIOVASCULAR: S1, S2 normal. 3/6 systolic murmur present, No rubs, or gallops.  ABDOMEN: Soft,  nontender, nondistended. Bowel sounds present. No organomegaly or mass.  EXTREMITIES: Hematoma palpable at right hip and it is very tender. There is significant bruising ecchymosis in the hamstring region of the right leg with bluish discoloration spreading down the knee NEUROLOGIC: Cranial nerves II through XII are intact. Muscle strength 5/5 in all extremities. Limited movement  of right leg due to recent surgery. Generalized weakness. Sensation intact except decreased in the right leg below the knee. Gait not checked.  PSYCHIATRIC: The patient is alert and oriented x 3.  SKIN: No obvious rash, lesion, or ulcer.   LABORATORY PANEL:   CBC  Recent Labs Lab 05/09/15 1819  WBC 7.0  HGB 5.0*  HCT 15.8*  PLT 244   ------------------------------------------------------------------------------------------------------------------  Chemistries   Recent Labs Lab 05/09/15 1819  NA 135  K 4.4  CL 101  CO2 26  GLUCOSE 95  BUN 22*  CREATININE 0.49  CALCIUM 8.4*  AST 17  ALT <5*  ALKPHOS 102  BILITOT 0.9   ------------------------------------------------------------------------------------------------------------------  Cardiac Enzymes No results for input(s): TROPONINI in the last 168 hours. ------------------------------------------------------------------------------------------------------------------  RADIOLOGY:  No results found.  EKG:   Orders placed or performed in visit on 10/02/14  . EKG 12-Lead    IMPRESSION AND PLAN:   Sabrina Duncan  is a 74 y.o. 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 Ola Spurr as her outpatient hemoglobin was low at 5.8.  #1 Acute on chronic anemia-likely postsurgical and also from the hematoma of the right hip. No other source of bleeding identified at this time. Agree with 2 units of packed RBC transfusion. Recheck hemoglobin every 8 hours for 3 times. Patient did have a complete GI workup done in the past including upper GI endoscopy and also colonoscopy within the last 5 years and they have been negative according to the patient. Hold her aspirin for today.  #2 prior left hip MRSA infection of the prosthesis-on lifelong antibiotics. Continue clindamycin and rifampin. Patient follows with Dr. Ola Spurr as an outpatient.  #3 recent right hip surgery-done about 3 weeks ago in [place]. Possible hematoma persistent at the hip. CT scan has been ordered. We'll have orthopedics follow-up here. Continue her home pain medications.  #4 Parkinson's disease-follows with Dr. Manuella Ghazi. Continue home medications.  #5 DVT prophylaxis-Ted's and SCDs for now    All the records are reviewed and case discussed with ED provider. Management plans discussed with the patient, family and they are in agreement.  CODE STATUS: Full code  TOTAL TIME TAKING CARE OF THIS PATIENT: 50 minutes.    Gladstone Lighter M.D on 05/09/2015 at 8:02 PM  Between 7am to 6pm - Pager - [phone]  After 6pm go to [email or web address] - password [email or web address]  Office  [phone]  CC: Primary care physician; Idelle Crouch, MD

## 2015-05-09 NOTE — ED Notes (Signed)
Dr. Clearnce Hasten notified of hgb of 5.0

## 2015-05-09 NOTE — Progress Notes (Signed)
Notified Dr Lavetta Nielsen of low BP. Also notified that blood order does not specify for how long to transfuse a unit. MD verbalized that he will placed orders himself.

## 2015-05-09 NOTE — ED Notes (Signed)
Attempted to obtain urine sample, patient unable to go at this time. Wants to try in a little bit.

## 2015-05-09 NOTE — ED Provider Notes (Signed)
[facility] Emergency Department Provider Note  ____________________________________________  Time seen: Approximately 6:30 PM  I have reviewed the triage vital signs and the nursing notes.   HISTORY  Chief Complaint Anemia    HPI Sabrina Duncan is a 74 y.o. female with a history of anemia who presents with weakness over the course of one month with a hemoglobin of 5.5 today as an outpatient. Patient denies any pain. Says the weakness is generalized. Of note, did have a right-sided hip replacement done in early April. Says that she has had to have a transfusion each time she has surgery. Has chronic osteomyelitis which she takes antibiotics for.   Past Medical History  Diagnosis Date  . Depressive disorder, not elsewhere classified   . Unspecified osteomyelitis, site unspecified   . Polymyalgia rheumatica   . Paralysis agitans   . Osteoarthrosis, unspecified whether generalized or localized, unspecified site   . Muscle weakness (generalized)   . Difficulty in walking(719.7)   . Parkinson disease   . PMR (polymyalgia rheumatica)   . Coronary atherosclerosis of unspecified type of vessel, native or graft     2007: LAD PCI with a BMS, 80% distal LCX stenosis treated medically. Most recent stress test in 04/2012 showed distal anterio/apical scar? with no ischemia, EF 53%.   [name] Unspecified essential hypertension   . Other and unspecified hyperlipidemia   . Thoracic ascending aortic aneurysm     4.0 cm  . Complication of anesthesia     2013- hallucinations- [facility]. (-spinal anesth. 2013- no problems   redo- hip- L)  . Esophageal reflux     pt. reports that its resolved   . Neuromuscular disorder     parkinson's - [facility] - neurologist   . Cancer     left breast cancer- lumpectomy, chemo, radiation, tamoxifen , SLNBx  . Anemia, unspecified     bld. transfusion- 02/2013    Patient Active Problem List   Diagnosis Date  Noted  . Coronary atherosclerosis   . Essential hypertension   . Other and unspecified hyperlipidemia   . Thoracic ascending aortic aneurysm     Past Surgical History  Procedure Laterality Date  . Knee surgery Left   . Varicose vein surgery      both legs   . Melanoma excision Right     elbow  . Knee surgery Left   . Hip replacement Bilateral   . Foot surgery Right 2013  . Rotator cuff repair Right   . Cardiac catheterization  2007    [place], [place]; x1 stent  . Cardiac catheterization  9/14    [facility]  . Breast lumpectomy Left 1997  . Joint replacement      L-hipx5, R hipx2, Lkneex1,   . Tonsillectomy    . Cholecystectomy  2005    open repair - for gangernous   . Peripherally inserted central catheter insertion      for IV antibiotics related to repeated infection, post hip replacement  . Reverse shoulder arthroplasty Right 09/29/2013    Procedure: REVERSE SHOULDER ARTHROPLASTY;  Surgeon: Nita Sells, MD;  Location: [place];  Service: Orthopedics;  Laterality: Right;  Right reverse total shoulder    Current Outpatient Rx  Name  Route  Sig  Dispense  Refill  . acetaminophen (TYLENOL) 325 MG tablet   Oral   Take 650 mg by mouth every 6 (six) hours as needed.         [name]  ARIPiprazole (ABILIFY) 5 MG tablet   Oral   Take 5 mg by mouth 2 (two) times daily.          [name] aspirin EC 81 MG tablet   Oral   Take 1 tablet (81 mg total) by mouth daily.   90 tablet   3   . atorvastatin (LIPITOR) 20 MG tablet   Oral   Take 20 mg by mouth daily.         . carbidopa-levodopa (SINEMET CR) 50-200 MG per tablet   Oral   Take 2 tablets by mouth 4 (four) times daily.          [name] CARBIDOPA-LEVODOPA PO   Oral   Take 50-100 mg by mouth as needed.         [name] CARBIDOPA-LEVODOPA-ENTACAPONE PO   Oral   Take 200 mg by mouth daily.         . Cholecalciferol (VITAMIN D3) 2000 UNITS capsule   Oral   Take 2,000 Units by mouth daily.         . clindamycin (CLEOCIN) 300  MG capsule   Oral   Take 1 capsule (300 mg total) by mouth 2 (two) times daily.   60 capsule   3   . Cyanocobalamin (B-12) 1000 MCG CAPS   Oral   Take by mouth daily.         [name] docusate sodium 100 MG CAPS   Oral   Take 100 mg by mouth 2 (two) times daily.   30 capsule   0   . ferrous fumarate (FERROCITE) 325 (106 FE) MG TABS tablet   Oral   Take 1 tablet by mouth daily.         [name] HYDROcodone-acetaminophen (NORCO/VICODIN) 5-325 MG per tablet   Oral   Take 1-2 tablets by mouth every 4 (four) hours as needed.   50 tablet   0   . Melatonin 3 MG CAPS   Oral   Take by mouth as needed.         . metoprolol (LOPRESSOR) 50 MG tablet   Oral   Take 50 mg by mouth 2 (two) times daily.          . rifampin (RIFADIN) 300 MG capsule   Oral   Take 300 mg by mouth 2 (two) times daily.         [name] rOPINIRole (REQUIP) 2 MG tablet   Oral   Take 2 mg by mouth 3 (three) times daily.         [name] venlafaxine XR (EFFEXOR-XR) 150 MG 24 hr capsule   Oral   Take 150 mg by mouth 2 (two) times daily.            Allergies Adhesive; Isosorbide dinitrate; Morphine; Morphine and related; Oxycodone-acetaminophen; Sulfa antibiotics; and Tazobactam  No family history on file.  Social History History  Substance Use Topics  . Smoking status: Former Smoker -- 0.50 packs/day for 10 years    Types: Cigarettes    Quit date: 09/26/1984  . Smokeless tobacco: Not on file  . Alcohol Use: Yes     Comment: rarely    Review of Systems Constitutional: No fever/chills Eyes: No visual changes. ENT: No sore throat. Cardiovascular: Denies chest pain. Respiratory: Denies shortness of breath. Gastrointestinal: No abdominal pain.  No nausea, no vomiting.  No diarrhea.  No constipation. Genitourinary: Negative for dysuria. Musculoskeletal: Negative for back pain. Skin: Negative for rash. Neurological: Negative for headaches, focal weakness  or numbness.  10-point ROS otherwise  negative.  ____________________________________________   PHYSICAL EXAM:  VITAL SIGNS: ED Triage Vitals  Enc Vitals Group     BP 05/09/15 1738 99/42 mmHg     Pulse Rate 05/09/15 1738 77     Resp 05/09/15 1738 20     Temp 05/09/15 1738 98.1 F (36.7 C)     Temp Source 05/09/15 1738 Oral     SpO2 05/09/15 1738 91 %     Weight 05/09/15 1738 138 lb (62.596 kg)     Height 05/09/15 1738 5\' 6"  (1.676 m)     Head Cir --      Peak Flow --      Pain Score --      Pain Loc --      Pain Edu? --      Excl. in [place]? --     Constitutional: Alert and oriented. Well appearing and in no acute distress. Eyes: Pale sclera. PERRL. EOMI. Head: Atraumatic. Nose: No congestion/rhinnorhea. Mouth/Throat: Mucous membranes are moist.  Oropharynx non-erythematous. Neck: No stridor.   Cardiovascular: Normal rate, regular rhythm. Grossly normal heart sounds.  Good peripheral circulation. Respiratory: Normal respiratory effort.  No retractions. Lungs CTAB. Gastrointestinal: Soft and nontender. No distention. No abdominal bruits. No CVA tenderness. Heme-negative stool. Grossly brown. Musculoskeletal: No lower extremity tenderness nor edema.  No joint effusions. Neurologic:  Normal speech and language. No gross focal neurologic deficits are appreciated. Speech is normal. No gait instability. Skin:  Skin is warm, dry and intact. Right hip examined with small amount of ecchymosis surrounding incision. No active bleeding or large hematoma.  Psychiatric: Mood and affect are normal. Speech and behavior are normal.  ____________________________________________   LABS (all labs ordered are listed, but only abnormal results are displayed)  Labs Reviewed  CBC - Abnormal; Notable for the following:    RBC 1.78 (*)    Hemoglobin 5.0 (*)    HCT 15.8 (*)    MCHC 31.7 (*)    RDW 23.7 (*)    All other components within normal limits  APTT  COMPREHENSIVE METABOLIC PANEL  PROTIME-INR  I-STAT TROPOININ, ED   SAMPLE TO BLOOD BANK   ____________________________________________  EKG  ED ECG REPORT   Date: 05/09/2015  EKG Time: 1758  Rate: 75  Rhythm: normal EKG, normal sinus rhythm, unchanged from previous tracings, normal sinus rhythm  Axis: Normal axis  Intervals:none  ST&T Change: Biphasic T-wave in V2. No ST elevations or depressions. Voltage criteria for left ventricular hypertrophy.  ____________________________________________  RADIOLOGY   ____________________________________________   PROCEDURES    ____________________________________________   INITIAL IMPRESSION / ASSESSMENT AND PLAN / ED COURSE  Pertinent labs & imaging results that were available during my care of the patient were reviewed by me and considered in my medical decision making (see chart for details).  ----------------------------------------- 7:17 PM on 05/09/2015 -----------------------------------------  Patient is resting comfortably. Aware of low hemoglobin. I will order 2 units of packed red blood cells for her and admit her to the hospital. Unclear cause of anemia. However, says did not receive blood in the operating room during his last surgery and suspects that is the cause. ____________________________________________   FINAL CLINICAL IMPRESSION(S) / ED DIAGNOSES  Acute on chronic anemia. Initial visit.    Orbie Pyo, MD 05/09/15 [phone]

## 2015-05-09 NOTE — Progress Notes (Signed)
Notified Dr. Lavetta Nielsen that need an order for type and screen. MD verbalized that he will enter an order himself.

## 2015-05-09 NOTE — ED Notes (Signed)
Reports recently having hip surgery in [place]. Came back to [place] last weekend.  Denies any blood in stool and denies any vomiting.

## 2015-05-10 LAB — BASIC METABOLIC PANEL
ANION GAP: 5 (ref 5–15)
BUN: 18 mg/dL (ref 6–20)
CALCIUM: 7.8 mg/dL — AB (ref 8.9–10.3)
CO2: 26 mmol/L (ref 22–32)
CREATININE: 0.51 mg/dL (ref 0.44–1.00)
Chloride: 105 mmol/L (ref 101–111)
GFR calc non Af Amer: >60 mL/min (ref >60)
Glucose, Bld: 92 mg/dL (ref 65–99)
Potassium: 4.3 mmol/L (ref 3.5–5.1)
Sodium: 136 mmol/L (ref 135–145)

## 2015-05-10 LAB — CBC
HEMATOCRIT: 19.2 % — AB (ref 35.0–47.0)
HEMOGLOBIN: 6.2 g/dL — AB (ref 12.0–16.0)
MCH: 28.4 pg (ref 26.0–34.0)
MCHC: 32.4 g/dL (ref 32.0–36.0)
MCV: 87.6 fL (ref 80.0–100.0)
Platelets: 206 10*3/uL (ref 150–440)
RBC: 2.19 MIL/uL — ABNORMAL LOW (ref 3.80–5.20)
RDW: 17.6 % — ABNORMAL HIGH (ref 11.5–14.5)
WBC: 7.2 10*3/uL (ref 3.6–11.0)

## 2015-05-10 LAB — URINALYSIS COMPLETE WITH MICROSCOPIC (ARMC ONLY)
BILIRUBIN URINE: NEGATIVE
Bacteria, UA: NONE SEEN
Glucose, UA: NEGATIVE mg/dL
Hgb urine dipstick: NEGATIVE
Nitrite: NEGATIVE
PROTEIN: NEGATIVE mg/dL
Specific Gravity, Urine: 1.018 (ref 1.005–1.030)
pH: 6 (ref 5.0–8.0)

## 2015-05-10 LAB — MRSA PCR SCREENING: MRSA by PCR: NEGATIVE

## 2015-05-10 LAB — PREPARE RBC (CROSSMATCH)

## 2015-05-10 LAB — HEMOGLOBIN
HEMOGLOBIN: 5.9 g/dL — AB (ref 12.0–16.0)
Hemoglobin: 4.9 g/dL — CL (ref 12.0–16.0)
Hemoglobin: 7.3 g/dL — ABNORMAL LOW (ref 12.0–16.0)

## 2015-05-10 MED ORDER — SODIUM CHLORIDE 0.9 % IV SOLN
Freq: Once | INTRAVENOUS | Status: AC
  Administered 2015-05-10: 16:00:00 via INTRAVENOUS

## 2015-05-10 MED ORDER — ENSURE ENLIVE PO LIQD
237.0000 mL | Freq: Two times a day (BID) | ORAL | Status: DC
  Administered 2015-05-10 – 2015-05-12 (×4): 237 mL via ORAL

## 2015-05-10 MED ORDER — SODIUM CHLORIDE 0.9 % IV SOLN
INTRAVENOUS | Status: DC
  Administered 2015-05-10 – 2015-05-13 (×5): via INTRAVENOUS

## 2015-05-10 MED ORDER — CHLORHEXIDINE GLUCONATE 0.12 % MT SOLN
15.0000 mL | Freq: Two times a day (BID) | OROMUCOSAL | Status: DC
  Administered 2015-05-10 – 2015-05-13 (×3): 15 mL via OROMUCOSAL

## 2015-05-10 MED FILL — Ropinirole Hydrochloride Tab 2 MG: ORAL | Qty: 1 | Status: AC

## 2015-05-10 NOTE — Progress Notes (Signed)
Notified Dr. Marcille Blanco by phone of the need for a diet order. MD verbaized to order regular diet.

## 2015-05-10 NOTE — Progress Notes (Signed)
Notified Dr Marcille Blanco by phone of clarification of order-Type and screen done. [facility] HLA ABC Typing? MD verbalized to keep the order and to check with the rounding MD if still needed.

## 2015-05-10 NOTE — Clinical Social Work Note (Signed)
Clinical Social Work Assessment  Patient Details  Name: Sabrina Duncan MRN: [id number] Date of Birth: March 12, 1941  Date of referral:  05/10/15               Reason for consult:  Facility Placement                Permission sought to share information with:  Facility Sport and exercise psychologist, Family Supports Permission granted to share information::  Yes, Verbal Permission Granted  Name::      (Son- Legrand Como and other family members)  Agency::   Legrand Como and other family members)  Relationship::     Contact Information:     Housing/Transportation Living arrangements for the past 2 months:  [place] of Information:  Patient, Other (Comment Required) (Daughter in [place] mother at bedside) Patient Interpreter Needed:  None Criminal Activity/Legal Involvement Pertinent to Current Situation/Hospitalization:  No - Comment as needed Significant Relationships:    Lives with:    Do you feel safe going back to the place where you live?  Yes Need for family participation in patient care:  No (Coment)  Care giving concerns:  Patient admitted from [facility]F where she is recuperating s/p THR about 6 weeks ago.   Social Worker assessment / plan:  CSW met with patient and her daughter in laws mother who is visiting at bedside. CSW introduced self and role to patient and family. They report that patient was in [facility] about 6 weeks ago to have a THR as she wanted to have the same surgeon who has done her many other THR's do this one as well. She went to rehab after the [facility] and was flying back to [facility] where she has resided at [facility] for 1.5 years and was taken to the ER in [facility] due to her incision bleeding. The family indicates the bleeding was controlled and she flew back to [facility]. She now is admitted with a hematoma, bleeding and anemia. Patient plans to return to ALF as pta and CSW has spoken with ALF rep who indicates she is ok to return there at dc. Patient was  already involved with [facility] at the ALF and this will be resumed at dc.   CSW will update FL2 for her return to ALF.   Employment status:  Retired Forensic scientist:  Medicare PT Recommendations:  Not assessed at this time [place] / Referral to community resources:     Patient/Family's Response to care:  Patient in good spirits- shared with CSW that she has had one of her hips replaced 4 times (recall) and is hopeful this will be the last surgery needed. She is also pleased with the care and treatment she has received here.   Patient/Family's Understanding of and Emotional Response to Diagnosis, Current Treatment, and Prognosis:   Patient and family understand the plans to treat the hematoma and the loss of blood. They are optimistic and appreciative of the care and assistance.  They are hopeful she will not need any further surgeries and can return to the ALF to continue her rehab.  Emotional Assessment Appearance:  Appears stated age, Other (Comment Required (drowsy) Attitude/Demeanor/Rapport:    Affect (typically observed):  Accepting, Calm, Happy Orientation:  Oriented to Self, Oriented to Place, Oriented to  Time, Oriented to Situation Alcohol / Substance use:  Not Applicable Psych involvement (Current and /or in the community):  No (Comment)  Discharge Needs  Concerns to be addressed:  Discharge Planning Concerns  Readmission within the last 30 days:  No Current discharge risk:  Physical Impairment Barriers to Discharge:  No Barriers Identified   Ludwig Clarks, LCSW 05/10/2015, 11:54 AM

## 2015-05-10 NOTE — Plan of Care (Signed)
Problem: Discharge Progression Outcomes Goal: Other Discharge Outcomes/Goals Progress to goals:   -Denies pain.   -Hypotensive. NS at 32ml/hr ordered by MD.   -HGB 6.2. Transfusing 1 unit PRBC's.    -Tolerating diet. Ensure supplement ordered BID.   -Bedrest due to Right Hip Surgery. Uses bedpan.

## 2015-05-10 NOTE — Progress Notes (Signed)
PT Cancellation Note  Patient Details Name: Natash Berman MRN: [id number] DOB: 1941-01-13   Cancelled Treatment:    Reason Eval/Treat Not Completed: Medical issues which prohibited therapy.  PT has been checking on hgb all AM and new reading is not yet posted.  Will await results   Ramond Dial 05/10/2015, 12:29 PM   Mee Hives, PT MS Acute Rehab Dept. Number: [facility] [id number] and [place] [phone]

## 2015-05-10 NOTE — Clinical Social Work Note (Signed)
Clinical Social Worker spoke with [facility] regarding pt's return at discharge. Pt is able to return when stable. Pt will have [facility]. CSW will continue to follow.   Darden Dates, MSW, LCSW Clinical Social Worker [phone]

## 2015-05-10 NOTE — Progress Notes (Signed)
Notified Dr Vianne Bulls by phone that Hgb needs to be drawn at 2000, but blood is still transfusing. MD verbalized to reschedule Hgb to 2230.

## 2015-05-10 NOTE — Care Management (Signed)
Met with patient to discuss discharge planning. Patient states she is from [facility]. Patient states she has been dependent on a wheelchair; she has a rolling walker also. She is receiving physical therapy currently but doesn't know name of agency. CSW involved. PT needed since she is post hip procedure. RNCM will continue to follow.

## 2015-05-10 NOTE — Progress Notes (Signed)
[place] [facility] [place] NAME: Sabrina Duncan    MR#:  [id number]  DATE OF BIRTH:  [birth year]  SUBJECTIVE:  CHIEF COMPLAINT:   Chief Complaint  Patient presents with  . Anemia    states was called to come here because low h/h of 5.5 had right hip replaced on april 4th,, has felt tired    REVIEW OF SYSTEMS:  CONSTITUTIONAL: No fever, have generalized fatigue and weakness.  EYES: No blurred or double vision.  EARS, NOSE, AND THROAT: No tinnitus or ear pain.  RESPIRATORY: No cough, shortness of breath, wheezing or hemoptysis.  CARDIOVASCULAR: No chest pain, orthopnea, edema.  GASTROINTESTINAL: No nausea, vomiting, diarrhea or abdominal pain.  GENITOURINARY: No dysuria, hematuria.  ENDOCRINE: No polyuria, nocturia,  HEMATOLOGY: No anemia, easy bruising or bleeding SKIN: No rash or lesion. MUSCULOSKELETAL: No joint pain or arthritis. But some soreness on right hip- where she had surgery 4 weeks ago.  NEUROLOGIC: No tingling, numbness, weakness.  PSYCHIATRY: No anxiety or depression.   ROS  DRUG ALLERGIES:   Allergies  Allergen Reactions  . Morphine And Related Other (See Comments)    Reaction:  Hallucinations   . Oxycodone Other (See Comments)    Reaction:  Unknown   . Sulfa Antibiotics Itching  . Tazobactam Other (See Comments)    Reaction:  Unknown   . Adhesive [Tape] Rash  . Isosorbide Rash    VITALS:  Blood pressure 90/43, pulse 93, temperature 98.7 F (37.1 C), temperature source Oral, resp. rate 18, height 5\' 6"  (1.676 m), weight 62.596 kg (138 lb), SpO2 100 %.  PHYSICAL EXAMINATION:  GENERAL:  74 y.o.-year-old patient lying in the bed with no acute distress.  EYES: Pupils equal, round, reactive to light and accommodation. No scleral icterus. Extraocular muscles intact. Conjunctiva pale. HEENT: Head atraumatic, normocephalic. Oropharynx and nasopharynx clear.  NECK:  Supple, no jugular venous distention. No thyroid  enlargement, no tenderness.  LUNGS: Normal breath sounds bilaterally, no wheezing, rales,rhonchi or crepitation. No use of accessory muscles of respiration.  CARDIOVASCULAR: S1, S2 normal. No murmurs, rubs, or gallops.  ABDOMEN: Soft, nontender, nondistended. Bowel sounds present. No organomegaly or mass.  EXTREMITIES: No pedal edema, cyanosis, or clubbing. Right side upper thigh and buttock- swelling, mild tender- no redness or warm. NEUROLOGIC: Cranial nerves II through XII are intact. Muscle strength 5/5 in all extremities. Sensation intact. Gait not checked.  PSYCHIATRIC: The patient is alert and oriented x 3.  SKIN: No obvious rash, lesion, or ulcer. Skin looks very pale.  Physical Exam LABORATORY PANEL:   CBC  Recent Labs Lab 05/10/15 0845  WBC 7.2  HGB 6.2*  HCT 19.2*  PLT 206   ------------------------------------------------------------------------------------------------------------------  Chemistries   Recent Labs Lab 05/09/15 1819 05/10/15 0845  NA 135 136  K 4.4 4.3  CL 101 105  CO2 26 26  GLUCOSE 95 92  BUN 22* 18  CREATININE 0.49 0.51  CALCIUM 8.4* 7.8*  AST 17  --   ALT <5*  --   ALKPHOS 102  --   BILITOT 0.9  --    ------------------------------------------------------------------------------------------------------------------  Cardiac Enzymes No results for input(s): TROPONINI in the last 168 hours. ------------------------------------------------------------------------------------------------------------------  RADIOLOGY:  Ct Femur Right Wo Contrast  05/09/2015   CLINICAL DATA:  Status post revision of right hip replacement 3 weeks ago at an outside facility. Subsequent development of a hematoma and decreased hemoglobin. Pain.  EXAM: CT OF THE RIGHT FEMUR WITHOUT CONTRAST  TECHNIQUE: Multidetector CT imaging was performed according to the standard protocol. Multiplanar CT image reconstructions were also generated.  COMPARISON:  Plain films of  the right hip 12/10/2013.  FINDINGS: Left hip arthroplasty seen on the prior study has been revised. The patient appears to have new acetabular and femoral components. There is a fracture of the medial wall of the right acetabulum with distraction of approximately 2.2 cm in the axial plane. The two most posterior screws penetrate the cortex of the posterior acetabulum.  Long stem femoral component is in place with multiple cerclage wires and a fixation plate laterally about superior aspect of the femoral component. The tip of the femoral stem penetrates the anterior cortex of the femur with a small nondisplaced fracture identified. There appears to be remote proximal femur fracture with callus formation present. The patient's hip replacement is located.  Soft tissues demonstrate a hematoma in the subcutaneous fat of the right buttock. While discrete measurement is not possible, the hematoma is large measuring 21 cm craniocaudal by approximately 8 cm transverse by 7 cm AP. The hematoma extends into the lateral aspect of the upper right thigh. No intramuscular hematoma is identified.  Bones are osteopenic. There is a remote healed segmental fracture of the right inferior pubic ramus. Imaged intrapelvic contents are unremarkable.  IMPRESSION: Status post revision of right total hip arthroplasty. There is a fracture of the medial wall of the right acetabulum which appears acute.  The distal most aspect of the stem of the femoral component penetrates the anterior cortex of the femur where a small fracture is identified.  Large hematoma in the lateral aspect of the right buttock extends into the right thigh.   Electronically Signed   By: Inge Rise M.D.   On: 05/09/2015 21:25     ASSESSMENT AND PLAN:   * Acute blood loss anemia   Have a huge hematoma on right hip- where she had recent surgery   As per CT there is an acute fracture along with that.   S/p 2 unit PRBC on 05/09/15.   1 more unit PRBC to be  given on 05/10/15  * recent hip sx with hematoma   Consult ortho.  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 Continue clindamycin and rifampin. Patient follows with Dr. Ola Spurr as an outpatient.  * Parkinson's disease-follows with Dr. Manuella Ghazi. Continue home medications.    All the records are reviewed and case discussed with Care Management/Social Workerr. Management plans discussed with the patient, family and they are in agreement.  CODE STATUS: Full  TOTAL TIME TAKING CARE OF THIS PATIENT: 40 minutes.   POSSIBLE D/C IN 2-3 DAYS, DEPENDING ON CLINICAL CONDITION.   Vaughan Basta M.D on 05/10/2015   Between 7am to 6pm - Pager - [phone]  After 6pm go to [email or web address] - password [email or web address] [facility]  Office  [phone]  CC: Primary care physician; Idelle Crouch, MD

## 2015-05-10 NOTE — Progress Notes (Signed)
Initial Nutrition Assessment  DOCUMENTATION CODES:     INTERVENTION: Medical Nutrition Supplement: Ensure Enlive (each supplement provides 350kcal and 20 grams of protein) BID between meals for additional nutrition  NUTRITION DIAGNOSIS:  Inadequate oral intake related to acute illness as evidenced by per patient/family report, energy intake < or equal to 75% for > or equal to 1 month.   GOAL:  Patient will meet greater than or equal to 90% of their needs   MONITOR:   (Energy Intake, Anthropometrics, Skin, Electrolyte and Renal Profile,)  REASON FOR ASSESSMENT:  Malnutrition Screening Tool    ASSESSMENT: Reason For Admission: Anemia PMHx: Past Medical History  Diagnosis Date  . Depressive disorder, not elsewhere classified   . Unspecified osteomyelitis, site unspecified   . Polymyalgia rheumatica   . Paralysis agitans   . Osteoarthrosis, unspecified whether generalized or localized, unspecified site   . Muscle weakness (generalized)   . Difficulty in walking(719.7)   . Parkinson disease   . PMR (polymyalgia rheumatica)   . Coronary atherosclerosis of unspecified type of vessel, native or graft     2007: LAD PCI with a BMS, 80% distal LCX stenosis treated medically. Most recent stress test in 04/2012 showed distal anterio/apical scar? with no ischemia, EF 53%.   [name] Unspecified essential hypertension   . Other and unspecified hyperlipidemia   . Thoracic ascending aortic aneurysm     4.0 cm  . Complication of anesthesia     2013- hallucinations- [facility]. (-spinal anesth. 2013- no problems   redo- hip- L)  . Esophageal reflux     pt. reports that its resolved   . Neuromuscular disorder     parkinson's - [facility] - neurologist   . Cancer     left breast cancer- lumpectomy, chemo, radiation, tamoxifen , SLNBx  . Anemia, unspecified     bld. transfusion- 02/2013    Typical Fluid/ Food Intake: 60% of one meal recorded per I/O  Meal/ Snack  Patterns: pt reports a decreased appetite and intake of a regular diet for about 6 weeks PTA (since her hip sx). She lives in ALF and typical Breakfast is cereal, main meal with a meat and 2 veg/ starch is at lunch time, and for dinner the center usually offers a "sandwich soup type meal". Patient reports eating "about half" of what she was eating >6 weeks ago.  Supplements: Boost Chocolate once per day  Labs:  Protein Profile:  Recent Labs Lab 05/09/15 1819  ALBUMIN 3.2*   Electrolyte and Renal Profile:    Recent Labs Lab 05/09/15 1819  BUN 22*  CREATININE 0.49  NA 135  K 4.4   Meds: Lipitor, Vit D, COlace, B-12  UOP:   Intake/Output Summary (Last 24 hours) at 05/10/15 1105 Last data filed at 05/10/15 0900  Gross per 24 hour  Intake   1850 ml  Output    600 ml  Net   1250 ml     Physical Findings: n/a  Weight Changes: Pt reports a weight of 145# "around the time of her hip sx". Current weight is a significant loss of approx 4.8% x 6 weeks.  Height:  Ht Readings from Last 1 Encounters:  05/09/15 5\' 6"  (1.676 m)    Weight:  Wt Readings from Last 1 Encounters:  05/09/15 138 lb (62.596 kg)    Ideal Body Weight:     Wt Readings from Last 10 Encounters:  05/09/15 138 lb (62.596 kg)  10/02/14 143 lb  8 oz (65.091 kg)  02/13/14 149 lb 12 oz (67.926 kg)  09/22/13 154 lb 8 oz (70.081 kg)  09/21/13 155 lb (70.308 kg)  08/24/13 155 lb (70.308 kg)  07/11/13 153 lb 8 oz (69.627 kg)    BMI:  Body mass index is 22.28 kg/(m^2).  Estimated Nutritional Needs:  Kcal:  1390-1668 kcal/ day (BEE: 1158 x 1.2 AF x 1.0.-1.2 IF)  Protein:  75-88 g Pro/day (1.2-1.4 g/ kg/ day)  Fluid:  1567-1881 ml/ day (25-30 ml/kg)  Skin:  Wound (see comment) (Stage II, Coccyx)  Diet Order:  Diet regular Room service appropriate?: Yes; Fluid consistency:: Thin  EDUCATION NEEDS:  No education needs identified at this time   Intake/Output Summary (Last 24 hours) at 05/10/15  1101 Last data filed at 05/10/15 0900  Gross per 24 hour  Intake   1850 ml  Output    600 ml  Net   1250 ml    Last BM:  5/18  Roda Shutters, RDN Pager: [phone] Office: [place]

## 2015-05-10 NOTE — Plan of Care (Signed)
Problem: Discharge Progression Outcomes Goal: Other Discharge Outcomes/Goals Outcome: Progressing Progress to goals: BP low on admission, MD notified, 1L NS bolus given with improvement. 2xunits of blood given during the night, no transfusion reaction occurred. VSS. Negative for MRSA PCR. Ice pack to R hip per order.  Uses the bedpan.

## 2015-05-10 NOTE — Plan of Care (Signed)
Problem: Discharge Progression Outcomes Goal: Discharge plan in place and appropriate Individualization: Outcome: Progressing Address pt as Sabrina Duncan. High fall risk- Recent R hip surgery. Pt uses the bedpan. Offer toileting qx1hr with safety checks. 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 H/o Parkinson's disease, CAD, depressive disorder-controlled with home meds. H/O polymyalagia rheumatica, multiple joints surgeries, breast cancer s/p chemo, chronic anemia.

## 2015-05-10 NOTE — Plan of Care (Signed)
Problem: Discharge Progression Outcomes Goal: Discharge plan in place and appropriate Individualization:  Outcome: Progressing Address pt as Sabrina Duncan. High fall risk- Recent R hip surgery. Pt uses the bedpan. Offer toileting qx1hr with safety checks. 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 H/o Parkinson's disease, CAD, depressive disorder-controlled with home meds. H/O polymyalagia rheumatica, multiple joints surgeries, breast cancer s/p chemo, chronic anemia.

## 2015-05-11 ENCOUNTER — Inpatient Hospital Stay: Payer: Medicare Other | Admitting: Certified Registered"

## 2015-05-11 ENCOUNTER — Encounter: Payer: Self-pay | Admitting: Anesthesiology

## 2015-05-11 ENCOUNTER — Encounter
Admission: EM | Disposition: A | Discharge status: Skilled Nursing Facility | Payer: Self-pay | Source: Home / Self Care | Attending: Internal Medicine

## 2015-05-11 HISTORY — PX: HEMATOMA EVACUATION: SHX5118

## 2015-05-11 LAB — CBC
HCT: 21.8 % — ABNORMAL LOW (ref 35.0–47.0)
HEMOGLOBIN: 7.2 g/dL — AB (ref 12.0–16.0)
MCH: 28.6 pg (ref 26.0–34.0)
MCHC: 32.9 g/dL (ref 32.0–36.0)
MCV: 86.9 fL (ref 80.0–100.0)
Platelets: 200 10*3/uL (ref 150–440)
RBC: 2.5 MIL/uL — AB (ref 3.80–5.20)
RDW: 17.6 % — ABNORMAL HIGH (ref 11.5–14.5)
WBC: 6.6 10*3/uL (ref 3.6–11.0)

## 2015-05-11 LAB — PREPARE RBC (CROSSMATCH)

## 2015-05-11 LAB — ABO/RH: ABO/RH(D): O POS

## 2015-05-11 SURGERY — EVACUATION HEMATOMA
Anesthesia: General | Laterality: Right | Wound class: Dirty or Infected

## 2015-05-11 MED ORDER — PROPOFOL 10 MG/ML IV BOLUS
INTRAVENOUS | Status: DC | PRN
Start: 2015-05-11 — End: 2015-05-11
  Administered 2015-05-11: 50 mg via INTRAVENOUS
  Administered 2015-05-11: 80 mg via INTRAVENOUS

## 2015-05-11 MED ORDER — CEFAZOLIN SODIUM 1-5 GM-% IV SOLN
INTRAVENOUS | Status: DC | PRN
  Administered 2015-05-11: 1 g via INTRAVENOUS

## 2015-05-11 MED ORDER — ONDANSETRON HCL 4 MG/2ML IJ SOLN: 4.0000 mg | Freq: Once | INTRAMUSCULAR | Status: AC | PRN

## 2015-05-11 MED ORDER — LACTATED RINGERS IV SOLN
INTRAVENOUS | Status: DC | PRN
  Administered 2015-05-11: 18:00:00 via INTRAVENOUS

## 2015-05-11 MED ORDER — PHENYLEPHRINE HCL 10 MG/ML IJ SOLN
INTRAMUSCULAR | Status: DC | PRN
  Administered 2015-05-11: 200 ug via INTRAVENOUS
  Administered 2015-05-11 (×2): 100 ug via INTRAVENOUS

## 2015-05-11 MED ORDER — ONDANSETRON HCL 4 MG/2ML IJ SOLN
INTRAMUSCULAR | Status: DC | PRN
  Administered 2015-05-11: 4 mg via INTRAVENOUS

## 2015-05-11 MED ORDER — NEOMYCIN-POLYMYXIN B GU 40-200000 IR SOLN
Status: DC | PRN
  Administered 2015-05-11: 12 mL

## 2015-05-11 MED ORDER — FENTANYL CITRATE (PF) 100 MCG/2ML IJ SOLN: 25.0000 ug | INTRAMUSCULAR | Status: DC | PRN

## 2015-05-11 MED ORDER — LIDOCAINE HCL (CARDIAC) 20 MG/ML IV SOLN
INTRAVENOUS | Status: DC | PRN
  Administered 2015-05-11: 80 mg via INTRAVENOUS

## 2015-05-11 MED ORDER — MIDAZOLAM HCL 2 MG/2ML IJ SOLN
INTRAMUSCULAR | Status: DC | PRN
  Administered 2015-05-11: 1 mg via INTRAVENOUS

## 2015-05-11 MED ORDER — CEFAZOLIN SODIUM 1-5 GM-% IV SOLN
1.0000 g | Freq: Three times a day (TID) | INTRAVENOUS | Status: DC
  Administered 2015-05-11 – 2015-05-14 (×8): 1 g via INTRAVENOUS
  Filled 2015-05-11 (×12): qty 50

## 2015-05-11 SURGICAL SUPPLY — 43 items
BNDG COHESIVE 6X5 TAN STRL LF (GAUZE/BANDAGES/DRESSINGS) IMPLANT
CANISTER SUCT 1200ML W/VALVE (MISCELLANEOUS) ×3 IMPLANT
CANISTER SUCT 3000ML (MISCELLANEOUS) ×3 IMPLANT
CHLORAPREP W/TINT 26ML (MISCELLANEOUS) ×3 IMPLANT
DRAPE INCISE IOBAN 66X60 STRL (DRAPES) ×3 IMPLANT
DRAPE SHEET LG 3/4 BI-LAMINATE (DRAPES) ×3 IMPLANT
DRAPE WOUND VAC 10X15X1CM (MISCELLANEOUS) ×3 IMPLANT
DRSG EMULSION OIL 3X8 NADH (GAUZE/BANDAGES/DRESSINGS) IMPLANT
DRSG VERSA FOAM LRG 10X15 (GAUZE/BANDAGES/DRESSINGS) ×3 IMPLANT
ELECT CAUTERY BLADE 6.4 (BLADE) IMPLANT
GAUZE PETRO XEROFOAM 1X8 (MISCELLANEOUS) IMPLANT
GAUZE SPONGE 4X4 12PLY STRL (GAUZE/BANDAGES/DRESSINGS) IMPLANT
GLOVE BIOGEL PI IND STRL 9 (GLOVE) ×1 IMPLANT
GLOVE BIOGEL PI INDICATOR 9 (GLOVE) ×2
GLOVE SURG ORTHO 9.0 STRL STRW (GLOVE) ×3 IMPLANT
GOWN SPECIALTY ULTRA XL (MISCELLANEOUS) ×3 IMPLANT
GOWN STRL REUS W/ TWL LRG LVL3 (GOWN DISPOSABLE) ×1 IMPLANT
GOWN STRL REUS W/TWL LRG LVL3 (GOWN DISPOSABLE) ×2
GOWN STRL REUS W/TWL XL LVL4 (GOWN DISPOSABLE) IMPLANT
HANDPIECE SUCTION TUBG SURGILV (MISCELLANEOUS) ×3 IMPLANT
HEMOVAC 400ML (MISCELLANEOUS) ×3
KIT DRAIN HEMOVAC JP 7FR 400ML (MISCELLANEOUS) ×1 IMPLANT
KIT RM TURNOVER STRD PROC AR (KITS) ×3 IMPLANT
NDL SAFETY 18GX1.5 (NEEDLE) ×3 IMPLANT
NEEDLE FILTER BLUNT 18X 1/2SAF (NEEDLE) ×2
NEEDLE FILTER BLUNT 18X1 1/2 (NEEDLE) ×1 IMPLANT
NEEDLE MAYO CATGUT SZ4 (NEEDLE) IMPLANT
NS IRRIG 1000ML POUR BTL (IV SOLUTION) ×6 IMPLANT
PACK HIP PROSTHESIS (MISCELLANEOUS) IMPLANT
PAD GROUND ADULT SPLIT (MISCELLANEOUS) ×3 IMPLANT
PAD NEG PRESSURE SENSATRAC (MISCELLANEOUS) ×3 IMPLANT
STAPLER SKIN PROX 35W (STAPLE) ×3 IMPLANT
SUT ETHIBOND #5 BRAIDED 30INL (SUTURE) IMPLANT
SUT TICRON 2-0 30IN 311381 (SUTURE) IMPLANT
SUT VIC AB 0 CT1 27 (SUTURE)
SUT VIC AB 0 CT1 27XCR 8 STRN (SUTURE) IMPLANT
SUT VIC AB 1 CTX 27 (SUTURE) ×6 IMPLANT
SUT VIC AB 2-0 CT1 27 (SUTURE)
SUT VIC AB 2-0 CT1 TAPERPNT 27 (SUTURE) IMPLANT
SWAB CULTURE AMIES ANAERIB BLU (MISCELLANEOUS) ×6 IMPLANT
SYRINGE 10CC LL (SYRINGE) ×3 IMPLANT
TAPE MICROFOAM 4IN (TAPE) IMPLANT
WND VAC CANISTER 500ML (MISCELLANEOUS) ×6 IMPLANT

## 2015-05-11 NOTE — Progress Notes (Signed)
1635-Report given to [employer] in [place]. No unanswered questions.  1700-Report given to [place], RN on 1A. No unanswered questions.  1745-Transported to OR by OR orderly.

## 2015-05-11 NOTE — Transfer of Care (Signed)
Immediate Anesthesia Transfer of Care Note  Patient: Sabrina Duncan  Procedure(s) Performed: Procedure(s): EVACUATION HEMATOMA/RIGHT HIP HEMATOMA DRAINAGE WITH WOUND VAC (Right)  Patient Location: PACU  Anesthesia Type:General  Level of Consciousness: awake and alert   Airway & Oxygen Therapy: Patient Spontanous Breathing and Patient connected to face mask oxygen  Post-op Assessment: Report given to RN and Post -op Vital signs reviewed and stable  Post vital signs: stable  Last Vitals:  Filed Vitals:   05/11/15 1848  BP: 114/65  Pulse: 71  Temp: 36.6 C  Resp: 15    Complications: No apparent anesthesia complications

## 2015-05-11 NOTE — Anesthesia Procedure Notes (Signed)
Procedure Name: LMA Insertion Date/Time: 05/11/2015 6:09 PM Performed by: Aline Brochure Pre-anesthesia Checklist: Patient identified, Emergency Drugs available, Suction available and Patient being monitored Patient Re-evaluated:Patient Re-evaluated prior to inductionOxygen Delivery Method: Circle system utilized Preoxygenation: Pre-oxygenation with 100% oxygen Intubation Type: IV induction Ventilation: Mask ventilation without difficulty LMA: LMA inserted LMA Size: 3.5 Number of attempts: 1 Tube secured with: Tape Dental Injury: Teeth and Oropharynx as per pre-operative assessment

## 2015-05-11 NOTE — Op Note (Signed)
05/09/2015 - 05/11/2015  6:48 PM  PATIENT:  Sabrina Duncan  74 y.o. female  PRE-OPERATIVE DIAGNOSIS:  HEMATOMA  POST-OPERATIVE DIAGNOSIS:  * No post-op diagnosis entered *large hematoma  PROCEDURE:  Procedure(s): EVACUATION HEMATOMA/RIGHT HIP HEMATOMA DRAINAGE WITH WOUND VAC (Right)  SURGEON: Laurene Footman, MD  ASSISTANTS: None  ANESTHESIA:   general  EBL:  Total I/O In: 120 [P.O.:120] Out: 950 [Urine:950]  BLOOD ADMINISTERED:none  DRAINS: Wound VAC applied with white foam dressing   LOCAL MEDICATIONS USED:  NONE  SPECIMEN:  Source of Specimen:  Culture 2 of hematoma  DISPOSITION OF SPECIMEN:  Microbiology  COUNTS:  YES  TOURNIQUET:  * No tourniquets in log *  IMPLANTS: None  DICTATION: .Dragon Dictation patient brought to the operating room and after adequate general anesthesia was obtained the patient was placed in the left lateral decubitus position with a beanbag holding in this position. After prepping and draping in usual sterile fashion appropriate patient identification and timeout procedure were completed. The center portion of the incision had a small area of drainage and this was elliptically excised with approximately 5 cm incision. cultures were obtained of the extensive fluid present. There was extensive clot and what appeared to be devitalized tissue as well as it was in the wound the undermining extended approximately 10 cm distal to the incision 10 cm anterior 15 cm posterior and 15 cm proximal the metallic plate was palpable at the proximal femur. The wound was then irrigated with pulsatile lavage and IV antibiotics given after cultures had been obtained. After adequate irrigation of the clot and necrotic tissue, the wound was dressed with a wound VAC with white cloth foam to allow for closing down of the dead space and hopefully prevent reaccumulation of hematoma.  PLAN OF CARE: Patient came in as an inpatient and will remain inpatient  PATIENT  DISPOSITION:  PACU - hemodynamically stable.

## 2015-05-11 NOTE — Anesthesia Preprocedure Evaluation (Signed)
Anesthesia Evaluation  Patient identified by MRN, date of birth, ID band Patient awake    Reviewed: Allergy & Precautions, NPO status , Patient's Chart, lab work & pertinent test results, reviewed documented beta blocker date and time   History of Anesthesia Complications (+) history of anesthetic complications  Airway Mallampati: II  TM Distance: >3 FB     Dental  (+) Chipped   Pulmonary former smoker,          Cardiovascular hypertension, + CAD and + Peripheral Vascular Disease     Neuro/Psych Depression  Neuromuscular disease    GI/Hepatic GERD-  ,  Endo/Other    Renal/GU      Musculoskeletal  (+) Arthritis -,   Abdominal   Peds  Hematology  (+) anemia ,   Anesthesia Other Findings   Reproductive/Obstetrics                             Anesthesia Physical Anesthesia Plan  ASA: III  Anesthesia Plan: General   Post-op Pain Management:    Induction:   Airway Management Planned: LMA  Additional Equipment:   Intra-op Plan:   Post-operative Plan:   Informed Consent: I have reviewed the patients History and Physical, chart, labs and discussed the procedure including the risks, benefits and alternatives for the proposed anesthesia with the patient or authorized representative who has indicated his/her understanding and acceptance.     Plan Discussed with: CRNA  Anesthesia Plan Comments: (Pt. NPO for 9 hrs.  Only 2 bites of a sandwich at 9 am.)        Anesthesia Quick Evaluation

## 2015-05-11 NOTE — Progress Notes (Signed)
[place] [facility] [place] NAME: Sabrina Duncan    MR#:  [id number]  DATE OF BIRTH:  November 26, 1941  SUBJECTIVE:  CHIEF COMPLAINT:   Chief Complaint  Patient presents with  . Anemia    states was called to come here because low h/h of 5.5 had right hip replaced on april 4th,, has felt tired    REVIEW OF SYSTEMS:  CONSTITUTIONAL: No fever, have generalized fatigue and weakness.  EYES: No blurred or double vision.  EARS, NOSE, AND THROAT: No tinnitus or ear pain.  RESPIRATORY: No cough, shortness of breath, wheezing or hemoptysis.  CARDIOVASCULAR: No chest pain, orthopnea, edema.  GASTROINTESTINAL: No nausea, vomiting, diarrhea or abdominal pain.  GENITOURINARY: No dysuria, hematuria.  ENDOCRINE: No polyuria, nocturia,  HEMATOLOGY: No anemia, easy bruising or bleeding SKIN: No rash or lesion. MUSCULOSKELETAL: No joint pain or arthritis. But some soreness on right hip- where she had surgery 4 weeks ago.  NEUROLOGIC: No tingling, numbness, weakness.  PSYCHIATRY: No anxiety or depression.   ROS  DRUG ALLERGIES:   Allergies  Allergen Reactions  . Morphine And Related Other (See Comments)    Reaction:  Hallucinations   . Oxycodone Other (See Comments)    Reaction:  Unknown   . Sulfa Antibiotics Itching  . Tazobactam Other (See Comments)    Reaction:  Unknown   . Adhesive [Tape] Rash  . Isosorbide Rash    VITALS:  Blood pressure 113/52, pulse 75, temperature 99.1 F (37.3 C), temperature source Oral, resp. rate 18, height 5\' 6"  (1.676 m), weight 62.596 kg (138 lb), SpO2 100 %.  PHYSICAL EXAMINATION:  GENERAL:  74 y.o.-year-old patient lying in the bed with no acute distress.  EYES: Pupils equal, round, reactive to light and accommodation. No scleral icterus. Extraocular muscles intact. Conjunctiva pale. HEENT: Head atraumatic, normocephalic. Oropharynx and nasopharynx clear.  NECK:  Supple, no jugular venous distention. No  thyroid enlargement, no tenderness.  LUNGS: Normal breath sounds bilaterally, no wheezing, rales,rhonchi or crepitation. No use of accessory muscles of respiration.  CARDIOVASCULAR: S1, S2 normal. No murmurs, rubs, or gallops.  ABDOMEN: Soft, nontender, nondistended. Bowel sounds present. No organomegaly or mass.  EXTREMITIES: No pedal edema, cyanosis, or clubbing. Right side upper thigh and buttock- swelling, mild tender- no redness or warm. NEUROLOGIC: Cranial nerves II through XII are intact. Muscle strength 5/5 in all extremities. Sensation intact. Gait not checked.  PSYCHIATRIC: The patient is alert and oriented x 3.  SKIN: No obvious rash, lesion, or ulcer. Skin looks very pale.  Physical Exam LABORATORY PANEL:   CBC  Recent Labs Lab 05/11/15 0456  WBC 6.6  HGB 7.2*  HCT 21.8*  PLT 200   ------------------------------------------------------------------------------------------------------------------  Chemistries   Recent Labs Lab 05/09/15 1819 05/10/15 0845  NA 135 136  K 4.4 4.3  CL 101 105  CO2 26 26  GLUCOSE 95 92  BUN 22* 18  CREATININE 0.49 0.51  CALCIUM 8.4* 7.8*  AST 17  --   ALT <5*  --   ALKPHOS 102  --   BILITOT 0.9  --    ------------------------------------------------------------------------------------------------------------------  RADIOLOGY:  Ct Femur Right Wo Contrast  05/09/2015   CLINICAL DATA:  Status post revision of right hip replacement 3 weeks ago at an outside facility. Subsequent development of a hematoma and decreased hemoglobin. Pain.  EXAM: CT OF THE RIGHT FEMUR WITHOUT CONTRAST  TECHNIQUE: Multidetector CT imaging was performed according to the standard protocol. Multiplanar CT image  reconstructions were also generated.  COMPARISON:  Plain films of the right hip 12/10/2013.  FINDINGS: Left hip arthroplasty seen on the prior study has been revised. The patient appears to have new acetabular and femoral components. There is a fracture  of the medial wall of the right acetabulum with distraction of approximately 2.2 cm in the axial plane. The two most posterior screws penetrate the cortex of the posterior acetabulum.  Long stem femoral component is in place with multiple cerclage wires and a fixation plate laterally about superior aspect of the femoral component. The tip of the femoral stem penetrates the anterior cortex of the femur with a small nondisplaced fracture identified. There appears to be remote proximal femur fracture with callus formation present. The patient's hip replacement is located.  Soft tissues demonstrate a hematoma in the subcutaneous fat of the right buttock. While discrete measurement is not possible, the hematoma is large measuring 21 cm craniocaudal by approximately 8 cm transverse by 7 cm AP. The hematoma extends into the lateral aspect of the upper right thigh. No intramuscular hematoma is identified.  Bones are osteopenic. There is a remote healed segmental fracture of the right inferior pubic ramus. Imaged intrapelvic contents are unremarkable.  IMPRESSION: Status post revision of right total hip arthroplasty. There is a fracture of the medial wall of the right acetabulum which appears acute.  The distal most aspect of the stem of the femoral component penetrates the anterior cortex of the femur where a small fracture is identified.  Large hematoma in the lateral aspect of the right buttock extends into the right thigh.   Electronically Signed   By: Inge Rise M.D.   On: 05/09/2015 21:25     ASSESSMENT AND PLAN:   * Acute blood loss anemia   Have a huge hematoma on right hip- where she had recent surgery   As per CT there is an acute fracture along with that.   S/p 2 unit PRBC on 05/09/15.   1 more unit PRBC to be given on 05/10/15  Hb > 7 - monitor.  * recent hip sx with hematoma   Consult ortho.   i spoke to her orthopedic doctor's office in [place]- they will send the documents of surgery and  radiological images post surgery.   May need drainage of heamtoma.  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 Continue clindamycin and rifampin. Patient follows with Dr. Ola Spurr as an outpatient.  * Parkinson's disease-follows with Dr. Manuella Ghazi. Continue home medications.   CODE STATUS: Full  TOTAL TIME TAKING CARE OF THIS PATIENT: 40 minutes.   POSSIBLE D/C IN 2-3 DAYS, DEPENDING ON CLINICAL CONDITION.   Vaughan Basta M.D on 05/11/2015   Between 7am to 6pm - Pager - [phone]  After 6pm go to [email or web address] - password [email or web address] [facility]  Office  [phone]  CC: Primary care physician; Idelle Crouch, MD

## 2015-05-11 NOTE — Plan of Care (Signed)
Problem: Discharge Progression Outcomes Goal: Discharge plan in place and appropriate Individualization:  Address pt as Sabrina Duncan. High fall risk- Recent R hip surgery. Pt uses the bedpan. Offer toileting qx1hr with safety checks. 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 H/o Parkinson's disease, CAD, depressive disorder-controlled with home meds. H/O polymyalagia rheumatica, multiple joints surgeries, breast cancer s/p chemo, chronic anemia. Goal: Other Discharge Outcomes/Goals Outcome: Progressing Plan of care progress to goals:  1x unit of blood transfused, no transfusion reaction occurred. VSS. Hgb 7.3. CBC pending. Pin-point wound on R hip with scant serosanguineous drainage. ABD dressing applied, ice pack prn to R hip hematoma per order. Pt denies pain.

## 2015-05-11 NOTE — Anesthesia Postprocedure Evaluation (Signed)
  Anesthesia Post-op Note  Patient: Sabrina Duncan  Procedure(s) Performed: Procedure(s): EVACUATION HEMATOMA/RIGHT HIP HEMATOMA DRAINAGE WITH WOUND VAC (Right)  Anesthesia type:General  Patient location: PACU  Post pain: Pain level controlled  Post assessment: Post-op Vital signs reviewed, Patient's Cardiovascular Status Stable, Respiratory Function Stable, Patent Airway and No signs of Nausea or vomiting  Post vital signs: Reviewed and stable  Last Vitals:  Filed Vitals:   05/11/15 1915  BP: 108/58  Pulse: 80  Temp:   Resp: 23    Level of consciousness: awake, alert  and patient cooperative  Complications: No apparent anesthesia complications

## 2015-05-11 NOTE — Consult Note (Signed)
Patient is a 74 year old approximate 6 weeks ago underwent extensive revision surgery Apparently for a metal-on-metal implant in [place]. She has been living in [place] as had prior total hip replacements with deep infection on the right with multiple surgeries for MRSA and now apparently resolved partly under the care of Dr. Ola Spurr here. Regarding her right hip she had the recent surgery apparently been doing okay and then this week Onset of a Large Hematoma over the Right Lateral Hip and Increasing Pain. She has had a CT scan that shows a fracture at the tip of the stem unknown whether this is acute or from the time of surgery.  On exam she has a very large hematoma proximally 10 x 10 cm and underneath her hip incision. There is a small pinpoint opening with a small amount of drainage present. His ecchymosis down the lateral aspect of the thigh. She has of weakness to ankle dorsiflexion for unknown reason on the right and uses an AFO. she has discoloration to the foot third and fourth toes that is related to a birthmark.  Impression as large hematoma, possible new fracture at distal end of prosthesis.  I discussed with her this is a very complicated problem the hematoma should be drained but there is the potential for deep infection that were present she might require hardware removal irrigation debridement and cement spacer. There also is this small fracture at the end of the prosthesis that may have occurred at the time of its implantation and may be stable as the stem is resting on the anterior cortex but I think we'll need to keep her toe-touch weightbearing and follow this at a minimum. I have not been able to contact her treating physician in [place] as he is currently at a conference. Plan I&D of hematoma today with wound VAC application to try prevent recurrence

## 2015-05-11 NOTE — Progress Notes (Signed)
PT Cancellation Note  Patient Details Name: Sabrina Duncan MRN: [id number] DOB: January 31, 1941   Cancelled Treatment:    Reason Eval/Treat Not Completed: Medical issues which prohibited therapy.  Previous surgical site on R hip is draining and nursing asked PT to hold visit as evacuation procedure for hematoma is going on today.  Will re-attempt tomorrow.   Ramond Dial 05/11/2015, 2:19 PM   Mee Hives, PT MS Acute Rehab Dept. Number: [id number] and [place] [phone]

## 2015-05-11 NOTE — Plan of Care (Signed)
Problem: Discharge Progression Outcomes Goal: Discharge plan in place and appropriate Individualization:  Outcome: Progressing Address pt as Sabrina Duncan. High fall risk- Recent R hip surgery. Pt uses the bedpan. Offer toileting qx1hr with safety checks. 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 H/o Parkinson's disease, CAD, depressive       disorder-controlled with home meds. H/O polymyalagia rheumatica, multiple joints surgeries, breast cancer s/p chemo, chronic anemia. Goal: Other Discharge Outcomes/Goals Outcome: Progressing Denies pain. NS at 13ml/hr infusing. Total of 3 units PRBCs. HGB 7.2.   Small pinpoint opening with a small amount of serous sanguineous drainage present. NPO for surgery. Bedrest due to Right Hip Surgery. Uses bedpan.

## 2015-05-12 ENCOUNTER — Inpatient Hospital Stay: Payer: Medicare Other

## 2015-05-12 LAB — CBC
HEMATOCRIT: 20.5 % — AB (ref 35.0–47.0)
Hemoglobin: 6.8 g/dL — ABNORMAL LOW (ref 12.0–16.0)
MCH: 28.6 pg (ref 26.0–34.0)
MCHC: 32.9 g/dL (ref 32.0–36.0)
MCV: 86.8 fL (ref 80.0–100.0)
Platelets: 196 10*3/uL (ref 150–440)
RBC: 2.37 MIL/uL — ABNORMAL LOW (ref 3.80–5.20)
RDW: 18.6 % — ABNORMAL HIGH (ref 11.5–14.5)
WBC: 5.5 10*3/uL (ref 3.6–11.0)

## 2015-05-12 LAB — URINE CULTURE

## 2015-05-12 LAB — PREPARE RBC (CROSSMATCH)

## 2015-05-12 MED ORDER — SODIUM CHLORIDE 0.9 % IV SOLN: Freq: Once | INTRAVENOUS | Status: DC

## 2015-05-12 MED ORDER — SODIUM CHLORIDE 0.9 % IV SOLN
Freq: Once | INTRAVENOUS | Status: AC
  Administered 2015-05-12: 19:00:00 via INTRAVENOUS

## 2015-05-12 NOTE — Progress Notes (Signed)
Subjective: 1 Day Post-Op Procedure(s) (LRB): EVACUATION HEMATOMA/RIGHT HIP HEMATOMA DRAINAGE WITH WOUND VAC (Right) Patient reports pain as mild.  Much better after evacuation of hematoma Patient is well, and has had no acute complaints or problems We will start therapy today, toe-touch weight-bearing right lower extremity   Objective: Vital signs in last 24 hours: Temp:  [97.8 F (36.6 C)-99.2 F (37.3 C)] 97.8 F (36.6 C) (05/21 0713) Pulse Rate:  [63-108] 85 (05/21 0713) Resp:  [15-23] 18 (05/21 0713) BP: (93-123)/(38-65) 123/52 mmHg (05/21 0713) SpO2:  [96 %-100 %] 99 % (05/21 0713)  Intake/Output from previous day: 05/20 0701 - 05/21 0700 In: 1104 [P.O.:120; I.V.:984] Out: 2050 [Urine:1500; Drains:550] Intake/Output this shift:     Recent Labs  05/10/15 0845 05/10/15 1537 05/10/15 2234 05/11/15 0456 05/12/15 0422  HGB 6.2* 5.9* 7.3* 7.2* 6.8*    Recent Labs  05/11/15 0456 05/12/15 0422  WBC 6.6 5.5  RBC 2.50* 2.37*  HCT 21.8* 20.5*  PLT 200 196    Recent Labs  05/09/15 1819 05/10/15 0845  NA 135 136  K 4.4 4.3  CL 101 105  CO2 26 26  BUN 22* 18  CREATININE 0.49 0.51  GLUCOSE 95 92  CALCIUM 8.4* 7.8*    Recent Labs  05/09/15 1819  INR 1.13    EXAM General - Patient is Alert, Appropriate and Oriented Extremity - Neurologically intact Neurovascular intact Sensation intact distally Intact pulses distally Dorsiflexion/Plantar flexion intact No cellulitis present  Ecchymosis to the right distal posterior thigh and proximal calf posteriorly. Negative Homans sign. Dressing - dressing C/D/I, wound VAC intact Motor Function - intact, moving foot and toes well on exam.   Past Medical History  Diagnosis Date  . Depressive disorder, not elsewhere classified   . Unspecified osteomyelitis, site unspecified   . Polymyalgia rheumatica   . Paralysis agitans   . Osteoarthrosis, unspecified whether generalized or localized, unspecified site    . Muscle weakness (generalized)   . Difficulty in walking(719.7)   . Parkinson disease   . PMR (polymyalgia rheumatica)   . Coronary atherosclerosis of unspecified type of vessel, native or graft     2007: LAD PCI with a BMS, 80% distal LCX stenosis treated medically. Most recent stress test in 04/2012 showed distal anterio/apical scar? with no ischemia, EF 53%.   [name] Unspecified essential hypertension   . Other and unspecified hyperlipidemia   . Thoracic ascending aortic aneurysm     4.0 cm  . Complication of anesthesia     2013- hallucinations- [facility]. (-spinal anesth. 2013- no problems   redo- hip- L)  . Esophageal reflux     pt. reports that its resolved   . Neuromuscular disorder     parkinson's - [facility] - neurologist   . Cancer     left breast cancer- lumpectomy, chemo, radiation, tamoxifen , SLNBx  . Anemia, unspecified     bld. transfusion- 02/2013    Assessment/Plan:   1 Day Post-Op Procedure(s) (LRB): EVACUATION HEMATOMA/RIGHT HIP HEMATOMA DRAINAGE WITH WOUND VAC (Right) Active Problems:   Anemia  Estimated body mass index is 22.28 kg/(m^2) as calculated from the following:   Height as of this encounter: 5\' 6"  (1.676 m).   Weight as of this encounter: 62.596 kg (138 lb). Advance diet Up with therapy  Recheck hemoglobin in the morning. Continue with iron supplementation Intraoperative cultures pending Continue with wound VAC  DVT Prophylaxis - None Toe-touch weight-bearing right lower extremity D/C  O2 and Pulse OX and try on Room Air  T. Rachelle Hora, PA-C [place] 05/12/2015, 9:04 AM

## 2015-05-12 NOTE — Progress Notes (Signed)
Pt up in chair today, moves well. Woundvac dsg removed and changed. 1 unit blood infusing at present.  Pt was tearful this am. Stating she is tired of trying to get better. She does not think she is going to be well again. No acute distress noted.

## 2015-05-12 NOTE — Progress Notes (Signed)
[place] at [place] NAME: Sabrina Duncan    MR#:  [id number]  DATE OF BIRTH:  [birth year]  SUBJECTIVE:  CHIEF COMPLAINT:   Chief Complaint  Patient presents with  . Anemia    states was called to come here because low h/h of 5.5 had right hip replaced on april 4th,, has felt tired   Today, feels fine, but worried about her complications and discharge planning to rehab. S/p surgery and drainage of hematoma on 05/11/15. REVIEW OF SYSTEMS:  CONSTITUTIONAL: No fever, have generalized fatigue and weakness.  EYES: No blurred or double vision.  EARS, NOSE, AND THROAT: No tinnitus or ear pain.  RESPIRATORY: No cough, shortness of breath, wheezing or hemoptysis.  CARDIOVASCULAR: No chest pain, orthopnea, edema.  GASTROINTESTINAL: No nausea, vomiting, diarrhea or abdominal pain.  GENITOURINARY: No dysuria, hematuria.  ENDOCRINE: No polyuria, nocturia,  HEMATOLOGY: No anemia, easy bruising or bleeding SKIN: No rash or lesion. MUSCULOSKELETAL: No joint pain or arthritis. But some soreness on right hip- where she had surgery 4 weeks ago. Wound vac on right hip. NEUROLOGIC: No tingling, numbness, weakness.  PSYCHIATRY: No anxiety or depression.   ROS  DRUG ALLERGIES:   Allergies  Allergen Reactions  . Morphine And Related Other (See Comments)    Reaction:  Hallucinations   . Oxycodone Other (See Comments)    Reaction:  Unknown   . Sulfa Antibiotics Itching  . Tazobactam Other (See Comments)    Reaction:  Unknown   . Adhesive [Tape] Rash  . Isosorbide Rash    VITALS:  Blood pressure 105/52, pulse 69, temperature 98.2 F (36.8 C), temperature source Oral, resp. rate 18, height 5\' 6"  (1.676 m), weight 62.596 kg (138 lb), SpO2 97 %.  PHYSICAL EXAMINATION:  GENERAL:  74 y.o.-year-old patient lying in the bed with no acute distress. Thin. EYES: Pupils equal, round, reactive to light and accommodation. No scleral icterus. Extraocular  muscles intact. Conjunctiva pale. HEENT: Head atraumatic, normocephalic. Oropharynx and nasopharynx clear.  NECK:  Supple, no jugular venous distention. No thyroid enlargement, no tenderness.  LUNGS: Normal breath sounds bilaterally, no wheezing, rales,rhonchi or crepitation. No use of accessory muscles of respiration.  CARDIOVASCULAR: S1, S2 normal. No murmurs, rubs, or gallops.  ABDOMEN: Soft, nontender, nondistended. Bowel sounds present. No organomegaly or mass.  EXTREMITIES: No pedal edema, cyanosis, or clubbing. Right side upper thigh and buttock- s/p surgery- wound vac in plae.NEUROLOGIC: Cranial nerves II through XII are intact. Muscle strength 5/5 in all extremities. Sensation intact. Gait not checked.  PSYCHIATRIC: The patient is alert and oriented x 3.  SKIN: No obvious rash, lesion, or ulcer. Skin looks very pale.  Physical Exam LABORATORY PANEL:   CBC  Recent Labs Lab 05/12/15 0422  WBC 5.5  HGB 6.8*  HCT 20.5*  PLT 196   ------------------------------------------------------------------------------------------------------------------  Chemistries   Recent Labs Lab 05/09/15 1819 05/10/15 0845  NA 135 136  K 4.4 4.3  CL 101 105  CO2 26 26  GLUCOSE 95 92  BUN 22* 18  CREATININE 0.49 0.51  CALCIUM 8.4* 7.8*  AST 17  --   ALT <5*  --   ALKPHOS 102  --   BILITOT 0.9  --    ------------------------------------------------------------------------------------------------------------------  RADIOLOGY:  Dg Hip Unilat With Pelvis 2-3 Views Right  05/12/2015   CLINICAL DATA:  Status post right hip replacement.  EXAM: RIGHT HIP (WITH PELVIS) 2-3 VIEWS  COMPARISON:  None.  FINDINGS: Right total hip  prosthesis in satisfactory position and alignment with additional fixation hardware. Old, healed right superior and inferior pubic ramus fractures. No acute fracture or dislocation seen.  IMPRESSION: Satisfactory postoperative appearance of a right total hip prosthesis.    Electronically Signed   By: Claudie Revering M.D.   On: 05/12/2015 10:41     ASSESSMENT AND PLAN:   * Acute blood loss anemia   Have a huge hematoma on right hip- where she had recent surgery   As per CT there is an acute fracture along with that.   S/p 2 unit PRBC on 05/09/15.   1 more unit PRBC to be given on 05/10/15  Hb > 7 - monitor.   After sx 05/11/15- again Hb 6.8- transfuse one more unit 05/12/15.  * recent hip sx with hematoma   Consult ortho.   Appreciated help- had hematoma drainage done- and have wound vac now.   Cultures from surgery sent- follow for any infection.    May need rehab, pt was in [place] recently- did not like it- will like to go somewhere else.   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 Continue clindamycin and rifampin. Patient follows with Dr. Ola Spurr as an outpatient.  * Parkinson's disease-follows with Dr. Manuella Ghazi. Continue home medications.   CODE STATUS: Full  TOTAL TIME TAKING CARE OF THIS PATIENT: 40 minutes.   POSSIBLE D/C IN 2-3 DAYS, DEPENDING ON CLINICAL CONDITION.   Vaughan Basta M.D on 05/12/2015   Between 7am to 6pm - Pager - [phone]  After 6pm go to [email or web address] - password [email or web address] [facility]  Office  [phone]  CC: Primary care physician; Idelle Crouch, MD

## 2015-05-12 NOTE — Progress Notes (Signed)
POD 1 from I and D of the right hip. Alert and oriented. VSS. Pain controlled with PO pain meds. Wound vac to right hip. Pt. Using bedpan. Pills whole with water. Resting quietly throughout the night.

## 2015-05-12 NOTE — Evaluation (Signed)
Physical Therapy Evaluation Patient Details Name: Sabrina Duncan MRN: [id number] DOB: Dec 09, 1941 Today's Date: 05/12/2015   History of Present Illness  presented to ER from outpatient appointment due to progressive weakness, tiredness and blurry vision, worsening, noted with HgB 5.8; admitted for symptomatic anemia.  Status post transfusion during hospitalization, HgB currently 6.8 and asymptomatic (cleared for OOB with therapy).  Hospital course additional significat for I and D of R hip hematoma (after R THR, posterior approach on 4/4 in [place]) with wound vac placement (5/20); to be TTWB R LE (noted with small fracture distal stem of R femoral component)  Clinical Impression  Upon evaluation, patient alert and oriented to information; notably frustrated/discouraged by current situation/events of hospitalization.  Presents with fair/good post-op strength and ROM to R hip; baseline R foot drop noted.  Pain controlled at 5/10, "manageable" per patient.  Able to complete bed mobility with min/mod assist; sit/stand, basic transfers and gait (5') with RW, cga/min assist from therapist.  Constant cuing/assist for TTWB R LE; difficulty fully supporting body weight in bilat UEs due to severe arthritic changes bilat shoulders.  Will plan to don shoes and R AFO in subsequent gait trials (patient has in room).  Anticipate WBing restrictions being a significant barrier for patient. Would benefit from skilled PT to address above deficits and promote optimal return to PLOF;recommend transition to STR upon discharge from acute hospitalization.  However, if ALF able to provide +1 assist for ALL transfers/mobility upon discharge, patient okay for return to ALF with HHPT (patient would really like to avoid STR if possible).    Follow Up Recommendations SNF (currently requiring +1 cga/min assist for all mobility.  If ALF can provide for all transfers/mobility, okay to return with HHPT.  If not, recommend STR)     Equipment Recommendations       Recommendations for Other Services       Precautions / Restrictions Precautions Precautions: Fall;Posterior Hip Precaution Comments: wound vac Restrictions Weight Bearing Restrictions: Yes RLE Weight Bearing: Touchdown weight bearing      Mobility  Bed Mobility Overal bed mobility: Needs Assistance Bed Mobility: Supine to Sit     Supine to sit: Min assist;Mod assist        Transfers Overall transfer level: Needs assistance Equipment used: Rolling walker (2 wheeled) Transfers: Sit to/from Stand Sit to Stand: Min assist            Ambulation/Gait Ambulation/Gait assistance: Min assist Ambulation Distance (Feet): 5 Feet Assistive device: Rolling walker (2 wheeled)       General Gait Details: 3 point step to gait pattern; constant cuing/assist for TTWB R LE.  Noted R LE foot drop (will plan for use of AFO/shoes which patient does have in room) in subsequent sessions  Stairs            Wheelchair Mobility    Modified Rankin (Stroke Patients Only)       Balance Overall balance assessment: Needs assistance Sitting-balance support: No upper extremity supported;Feet supported Sitting balance-Leahy Scale: Good     Standing balance support: Bilateral upper extremity supported Standing balance-Leahy Scale: Fair                               Pertinent Vitals/Pain Pain Assessment: 0-10 Pain Score: 5  Pain Location: R hip Pain Descriptors / Indicators: Aching Pain Intervention(s): Limited activity within patient's tolerance;Premedicated before session;Repositioned    Home Living Family/patient expects to  be discharged to:: Assisted living               Home Equipment: Walker - 2 wheels      Prior Function Level of Independence: Needs assistance   Gait / Transfers Assistance Needed: Ambulates throughout room environment, to/from dining hall (for all meals) with RW, R AFO, mod indep  ADL's /  Homemaking Assistance Needed: Assist from staff for all ADLs  Comments: Reports single fall in recent months     Hand Dominance        Extremity/Trunk Assessment   Upper Extremity Assessment:  (bilat UE shoulder elevation with significant limitations (arthritic changes, history of R TSR); elbows, wrists and hands grossly WFL)           Lower Extremity Assessment: Generalized weakness (L LE grossly WFL for strength and ROM. R hip ROM grossly WFL except R ankle DF to neutral only (passive); R hip strength at least 3-/5, knee 4/5, ankle DF 0/5, ankle PF 1/5)      Cervical / Trunk Assessment: Kyphotic  Communication   Communication: No difficulties  Cognition Arousal/Alertness: Awake/alert Behavior During Therapy: WFL for tasks assessed/performed Overall Cognitive Status: Within Functional Limits for tasks assessed                      General Comments General comments (skin integrity, edema, etc.): wound vac to R hip, darkened ecchymosis to R hip/posterior thigh    Exercises Other Exercises Other Exercises: Supine LE therex, 1x10, AROM for muscular strength/endurance of R LE: ankle pumps (passive), quad sets, SAQs, heel slides and hip abduct/adduct.  Patient able to verbalize 3/3 posterior THPs with min cuing from therapist (states her physician recommends no hip flex past 80 degrees) (10 minutes) Other Exercises: Toilet transfer, SPT with RW, cga/min assist.  Sit/stand from [facility] with RW, cga/min assist.  Standing balacne for hygiene with RW, cga.  Constant cuing for adherence to TTWB R LE      Assessment/Plan    PT Assessment Patient needs continued PT services  PT Diagnosis Difficulty walking;Generalized weakness;Acute pain   PT Problem List Decreased strength;Decreased range of motion;Decreased activity tolerance;Decreased balance;Decreased mobility;Decreased safety awareness;Decreased knowledge of precautions;Pain;Decreased skin integrity  PT Treatment  Interventions DME instruction;Gait training;Stair training;Functional mobility training;Therapeutic activities;Therapeutic exercise;Balance training;Patient/family education   PT Goals (Current goals can be found in the Care Plan section) Acute Rehab PT Goals Patient Stated Goal: "to go sit in the bathroom for a while" PT Goal Formulation: With patient Time For Goal Achievement: 05/26/15 Potential to Achieve Goals: Good    Frequency 7X/week   Barriers to discharge        Co-evaluation               End of Session Equipment Utilized During Treatment: Gait belt Activity Tolerance: Patient tolerated treatment well Patient left: in chair;with call bell/phone within reach;with chair alarm set Nurse Communication: Mobility status;Weight bearing status         Time: 1151-1219 PT Time Calculation (min) (ACUTE ONLY): 28 min   Charges:   PT Evaluation $Initial PT Evaluation Tier I: 1 Procedure PT Treatments $Therapeutic Exercise: 8-22 mins   PT G Codes:        Camden Mazzaferro H. Owens Shark, PT, DPT 05/12/2015, 12:36 PM [phone]

## 2015-05-13 LAB — CBC
HCT: 25.7 % — ABNORMAL LOW (ref 35.0–47.0)
Hemoglobin: 8.2 g/dL — ABNORMAL LOW (ref 12.0–16.0)
MCH: 28 pg (ref 26.0–34.0)
MCHC: 32 g/dL (ref 32.0–36.0)
MCV: 87.4 fL (ref 80.0–100.0)
PLATELETS: 201 10*3/uL (ref 150–440)
RBC: 2.94 MIL/uL — ABNORMAL LOW (ref 3.80–5.20)
RDW: 18.1 % — ABNORMAL HIGH (ref 11.5–14.5)
WBC: 4.9 10*3/uL (ref 3.6–11.0)

## 2015-05-13 MED ORDER — ARIPIPRAZOLE 5 MG PO TABS
5.0000 mg | ORAL_TABLET | Freq: Every day | ORAL | Status: DC
  Administered 2015-05-14: 5 mg via ORAL
  Filled 2015-05-13: qty 1

## 2015-05-13 NOTE — Progress Notes (Signed)
[place] at [place] NAME: Sabrina Duncan    MR#:  [id number]  DATE OF BIRTH:  [birth year]  SUBJECTIVE:  CHIEF COMPLAINT:   Chief Complaint  Patient presents with  . Anemia    states was called to come here because low h/h of 5.5 had right hip replaced on april 4th,, has felt tired   Feeling very depressed today. Would like psych meds reviewed and counseling referral. No pain.  REVIEW OF SYSTEMS:   Review of Systems  Constitutional: Negative for fever.  Respiratory: Negative for shortness of breath.   Cardiovascular: Negative for chest pain and palpitations.  Gastrointestinal: Negative for nausea, vomiting and abdominal pain.  Genitourinary: Negative for dysuria.    DRUG ALLERGIES:   Allergies  Allergen Reactions  . Morphine And Related Other (See Comments)    Reaction:  Hallucinations   . Oxycodone Other (See Comments)    Reaction:  Unknown   . Sulfa Antibiotics Itching  . Tazobactam Other (See Comments)    Reaction:  Unknown   . Adhesive [Tape] Rash  . Isosorbide Rash    VITALS:  Blood pressure 116/58, pulse 72, temperature 97.9 F (36.6 C), temperature source Oral, resp. rate 18, height 5\' 6"  (1.676 m), weight 62.596 kg (138 lb), SpO2 98 %.  PHYSICAL EXAMINATION:   Physical Exam  Constitutional: She is oriented to person, place, and time and well-developed, well-nourished, and in no distress.  HENT:  Head: Normocephalic and atraumatic.  Neck: Normal range of motion. No thyromegaly present.  Cardiovascular: Normal rate and regular rhythm.   Pulmonary/Chest: Effort normal and breath sounds normal. No respiratory distress.  Abdominal: Soft. Bowel sounds are normal. She exhibits no distension. There is no tenderness.  Musculoskeletal: She exhibits no edema.  Wound vac in place over right hip  Neurological: She is alert and oriented to person, place, and time.  Psychiatric:  depressed   LABORATORY PANEL:    CBC  Recent Labs Lab 05/13/15 0447  WBC 4.9  HGB 8.2*  HCT 25.7*  PLT 201   ------------------------------------------------------------------------------------------------------------------  Chemistries   Recent Labs Lab 05/09/15 1819 05/10/15 0845  NA 135 136  K 4.4 4.3  CL 101 105  CO2 26 26  GLUCOSE 95 92  BUN 22* 18  CREATININE 0.49 0.51  CALCIUM 8.4* 7.8*  AST 17  --   ALT <5*  --   ALKPHOS 102  --   BILITOT 0.9  --    ------------------------------------------------------------------------------------------------------------------  RADIOLOGY:  Dg Hip Unilat With Pelvis 2-3 Views Right  05/12/2015   CLINICAL DATA:  Status post right hip replacement.  EXAM: RIGHT HIP (WITH PELVIS) 2-3 VIEWS  COMPARISON:  None.  FINDINGS: Right total hip prosthesis in satisfactory position and alignment with additional fixation hardware. Old, healed right superior and inferior pubic ramus fractures. No acute fracture or dislocation seen.  IMPRESSION: Satisfactory postoperative appearance of a right total hip prosthesis.   Electronically Signed   By: Claudie Revering M.D.   On: 05/12/2015 10:41     ASSESSMENT AND PLAN:   * Acute blood loss anemia - due to hematoma of right hip, now s/p evaucation with wound vac in place - has received 2 U 5/18. 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  - Continue clindamycin and rifampin. Patient follows with Dr. Ola Spurr as an outpatient. - cultures pending from this admission  * Parkinson's disease-follows with Dr. Manuella Ghazi. Continue home medications.  * depression - many stressors, will consult psychiatry for review of medications and further recomendations   CODE STATUS: Full  TOTAL TIME TAKING CARE OF  THIS PATIENT: 40 minutes.   POSSIBLE D/C IN 2-3 DAYS, DEPENDING ON CLINICAL CONDITION.   Myrtis Ser M.D on 05/13/2015   Between 7am to 6pm - Pager - [phone]  After 6pm go to [email or web address] - password [email or web address] [facility]  Office  [phone]  CC: Primary care physician; Idelle Crouch, MD

## 2015-05-13 NOTE — Progress Notes (Addendum)
Subjective: 2 Days Post-Op Procedure(s) (LRB): EVACUATION HEMATOMA/RIGHT HIP HEMATOMA DRAINAGE WITH WOUND VAC (Right) Patient reports pain as mild.   Patient is well, and has had no acute complaints or problems. We will continue with physical therapy today.  Plan is to go Skilled nursing facility after hospital stay.  Objective: Vital signs in last 24 hours: Temp:  [97.7 F (36.5 C)-98.7 F (37.1 C)] 97.7 F (36.5 C) (05/22 0425) Pulse Rate:  [69-83] 77 (05/22 0425) Resp:  [16-18] 18 (05/22 0425) BP: (87-133)/(40-76) 133/76 mmHg (05/22 0425) SpO2:  [97 %-100 %] 98 % (05/22 0425)  Intake/Output from previous day: 05/21 0701 - 05/22 0700 In: 1240 [P.O.:480; Blood:760] Out: 750 [Urine:650; Drains:100] Intake/Output this shift:     Recent Labs  05/10/15 1537 05/10/15 2234 05/11/15 0456 05/12/15 0422 05/13/15 0447  HGB 5.9* 7.3* 7.2* 6.8* 8.2*    Recent Labs  05/12/15 0422 05/13/15 0447  WBC 5.5 4.9  RBC 2.37* 2.94*  HCT 20.5* 25.7*  PLT 196 201    Recent Labs  05/10/15 0845  NA 136  K 4.3  CL 105  CO2 26  BUN 18  CREATININE 0.51  GLUCOSE 92  CALCIUM 7.8*   No results for input(s): LABPT, INR in the last 72 hours.  EXAM General - Patient is Alert, Appropriate and Oriented Extremity - Neurologically intact Neurovascular intact Sensation intact distally Intact pulses distally No cellulitis present Dressing - dressing C/D/I and Wound VAC is intact Motor Function - intact, moving foot and toes well on exam. Patient with right foot drop  Past Medical History  Diagnosis Date  . Depressive disorder, not elsewhere classified   . Unspecified osteomyelitis, site unspecified   . Polymyalgia rheumatica   . Paralysis agitans   . Osteoarthrosis, unspecified whether generalized or localized, unspecified site   . Muscle weakness (generalized)   . Difficulty in walking(719.7)   . Parkinson disease   . PMR (polymyalgia rheumatica)   . Coronary  atherosclerosis of unspecified type of vessel, native or graft     2007: LAD PCI with a BMS, 80% distal LCX stenosis treated medically. Most recent stress test in 04/2012 showed distal anterio/apical scar? with no ischemia, EF 53%.   [name] Unspecified essential hypertension   . Other and unspecified hyperlipidemia   . Thoracic ascending aortic aneurysm     4.0 cm  . Complication of anesthesia     2013- hallucinations- [facility]. (-spinal anesth. 2013- no problems   redo- hip- L)  . Esophageal reflux     pt. reports that its resolved   . Neuromuscular disorder     parkinson's - [facility] - neurologist   . Cancer     left breast cancer- lumpectomy, chemo, radiation, tamoxifen , SLNBx  . Anemia, unspecified     bld. transfusion- 02/2013    Assessment/Plan:   2 Days Post-Op Procedure(s) (LRB): EVACUATION HEMATOMA/RIGHT HIP HEMATOMA DRAINAGE WITH WOUND VAC (Right) Active Problems:   Anemia  Estimated body mass index is 22.28 kg/(m^2) as calculated from the following:   Height as of this encounter: 5\' 6"  (1.676 m).   Weight as of this encounter: 62.596 kg (138 lb). Advance diet  Continue with IV antibiotics Continue with wound VAC Recheck labs in the morning Culture showing no growth, continue to follow     Toe-touch weightbearing right lower extremity D/C O2 and Pulse OX and try on Room Air  T. Rachelle Hora, PA-C [place] 05/13/2015, 8:02 AM

## 2015-05-13 NOTE — Progress Notes (Signed)
Physical Therapy Treatment Patient Details Name: Sabrina Duncan MRN: [id number] DOB: 05-04-1941 Today's Date: 05/13/2015    History of Present Illness presented to ER from outpatient appointment due to progressive weakness, tiredness and blurry vision, worsening, noted with HgB 5.8; admitted for symptomatic anemia.  Status post transfusion during hospitalization, HgB currently 6.8 and asymptomatic (cleared for OOB with therapy).  Hospital course additional significat for I and D of R hip hematoma (after R THR, posterior approach [place] 4/4 in [place]) with wound vac placement (5/20); to be TTWB R LE (noted with small fracture distal stem of R femoral component)    PT Comments    Pt very pleasant and willing to participate.  She shows good effort trying to keep weight off R LE, but still needs direct assist to successfully unweight it.  She has no LOBs and does not report any increased pain with 25 ft of ambulation.  Follow Up Recommendations  SNF     Equipment Recommendations       Recommendations for Other Services       Precautions / Restrictions Restrictions Weight Bearing Restrictions: Yes RLE Weight Bearing: Touchdown weight bearing    Mobility  Bed Mobility Overal bed mobility:  (pt in recliner on arrival)                Transfers Overall transfer level: Modified independent Equipment used: Rolling walker (2 wheeled) Transfers: Sit to/from Stand Sit to Stand: Min guard            Ambulation/Gait Ambulation/Gait assistance: Min assist Ambulation Distance (Feet): 25 Feet Assistive device: Rolling walker (2 wheeled)       General Gait Details: Pt generally wanting to put R toe on floor and may have put more than TTWBing, encouraged and gave assist to keep weight off   Stairs            Wheelchair Mobility    Modified Rankin (Stroke Patients Only)       Balance                                    Cognition  Arousal/Alertness: Awake/alert Behavior During Therapy: WFL for tasks assessed/performed Overall Cognitive Status: Within Functional Limits for tasks assessed                      Exercises General Exercises - Lower Extremity Long Arc Quad: 10 reps;Strengthening Heel Slides: 10 reps;Strengthening Hip ABduction/ADduction: Strengthening;10 reps Hip Flexion/Marching: 10 reps;Strengthening    General Comments        Pertinent Vitals/Pain Pain Assessment: 0-10 Pain Score: 3  Pain Location: R hip    Home Living                      Prior Function            PT Goals (current goals can now be found in the care plan section) Acute Rehab PT Goals PT Goal Formulation: With patient Time For Goal Achievement: 05/26/15 Potential to Achieve Goals: Good Progress towards PT goals: Progressing toward goals    Frequency  7X/week    PT Plan Current plan remains appropriate    Co-evaluation             End of Session Equipment Utilized During Treatment: Gait belt Activity Tolerance: Patient tolerated treatment well Patient left: with chair alarm set  Time: 3382-5053 PT Time Calculation (min) (ACUTE ONLY): 26 min  Charges:  $Gait Training: 8-22 mins $Therapeutic Exercise: 8-22 mins                    G Codes:     Wayne Both, PT, DPT [phone]  Kreg Shropshire 05/13/2015, 5:17 PM

## 2015-05-13 NOTE — Consult Note (Signed)
Pt seen on the Floor i RN 152. 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 Cc" I am not aging well." H/O Present Illness Pt reports that she has multiple medical problems which includes  Parkinson's disease and had hip surgery and had complications and had hematoma Which dropped her blood count low.    she started feeling tired because her blood count dropped and started getting more and more depressed past psychiatric history no history of inpatient psychiatry no history of suicide attempts was being followed by a psychiatrist that made housecalls and she is not more tired and plans to find a different psychiatrist all call and drugs denied denies smoking nicotine cigarettes patient is on Effexor XR X1 50 mg twice a day for her depression mental status she is alert and oriented calm and corporative APS tired affect is flat and restricted feeling low and down and depressed about her physical situation and physical illness as she feels she is not aging well. No psychosis admits that she had some hallucinations when she was coming out of anesthesia in the past denies suicidal or homicidal ideas or plans and wants to get better and well does admit to sleep disturbance and sleeps only 4 hours a night but does take naps during the day appetite has been low but she is trying to eat more so that she does not feel as tired denied insight and judgment fair and adequate impression major depressive disorder recurrent moderate recommend add Abilify 5 mg by mouth daily to act as an adjunct with Effexor XR supportive therapy with coping skills in dealing with her life situation

## 2015-05-14 ENCOUNTER — Encounter: Payer: Self-pay | Admitting: Orthopedic Surgery

## 2015-05-14 DIAGNOSIS — G20 Parkinson's disease: Secondary | ICD-10-CM | POA: Diagnosis present

## 2015-05-14 DIAGNOSIS — D62 Acute posthemorrhagic anemia: Secondary | ICD-10-CM

## 2015-05-14 DIAGNOSIS — F411 Generalized anxiety disorder: Secondary | ICD-10-CM | POA: Diagnosis present

## 2015-05-14 DIAGNOSIS — T148XXA Other injury of unspecified body region, initial encounter: Secondary | ICD-10-CM

## 2015-05-14 LAB — BASIC METABOLIC PANEL
Anion gap: 3 — ABNORMAL LOW (ref 5–15)
BUN: 16 mg/dL (ref 6–20)
CHLORIDE: 108 mmol/L (ref 101–111)
CO2: 27 mmol/L (ref 22–32)
CREATININE: 0.48 mg/dL (ref 0.44–1.00)
Calcium: 8.1 mg/dL — ABNORMAL LOW (ref 8.9–10.3)
GFR calc non Af Amer: >60 mL/min (ref >60)
Glucose, Bld: 92 mg/dL (ref 65–99)
Potassium: 4.2 mmol/L (ref 3.5–5.1)
Sodium: 138 mmol/L (ref 135–145)

## 2015-05-14 LAB — CBC
HCT: 25.1 % — ABNORMAL LOW (ref 35.0–47.0)
Hemoglobin: 8.3 g/dL — ABNORMAL LOW (ref 12.0–16.0)
MCH: 28.8 pg (ref 26.0–34.0)
MCHC: 33.1 g/dL (ref 32.0–36.0)
MCV: 87.1 fL (ref 80.0–100.0)
PLATELETS: 216 10*3/uL (ref 150–440)
RBC: 2.89 MIL/uL — ABNORMAL LOW (ref 3.80–5.20)
RDW: 19.1 % — ABNORMAL HIGH (ref 11.5–14.5)
WBC: 5 10*3/uL (ref 3.6–11.0)

## 2015-05-14 MED ORDER — ACETAMINOPHEN 325 MG PO TABS: 650.0000 mg | ORAL_TABLET | Freq: Four times a day (QID) | ORAL | Status: AC | PRN

## 2015-05-14 MED ORDER — HYDROCODONE-ACETAMINOPHEN 5-325 MG PO TABS: 1.0000 | ORAL_TABLET | Freq: Three times a day (TID) | ORAL | Status: DC | PRN

## 2015-05-14 MED ORDER — TRAMADOL HCL 50 MG PO TABS: 50.0000 mg | ORAL_TABLET | Freq: Four times a day (QID) | ORAL | Status: DC | PRN

## 2015-05-14 MED ORDER — SENNA 8.6 MG PO TABS: 1.0000 | ORAL_TABLET | Freq: Every day | ORAL | Status: DC | PRN

## 2015-05-14 MED ORDER — ARIPIPRAZOLE 5 MG PO TABS: 5.0000 mg | ORAL_TABLET | Freq: Every day | ORAL | Status: DC

## 2015-05-14 NOTE — Progress Notes (Signed)
[place] at [place] NAME: Sabrina Duncan    MR#:  [id number]  DATE OF BIRTH:  May 22, 1941  SUBJECTIVE:   No complaints. Did not find a psychiatric consultation yesterday helpful. Anxious, tearful when she hears that she is medically cleared for discharge  REVIEW OF SYSTEMS:   Review of Systems  Constitutional: Negative for fever.  Respiratory: Negative for shortness of breath.   Cardiovascular: Negative for chest pain and palpitations.  Gastrointestinal: Negative for nausea, vomiting and abdominal pain.  Genitourinary: Negative for dysuria.    DRUG ALLERGIES:   Allergies  Allergen Reactions  . Morphine And Related Other (See Comments)    Reaction:  Hallucinations   . Oxycodone Other (See Comments)    Reaction:  Unknown   . Sulfa Antibiotics Itching  . Tazobactam Other (See Comments)    Reaction:  Unknown   . Adhesive [Tape] Rash  . Isosorbide Rash    VITALS:  Blood pressure 114/57, pulse 81, temperature 98.1 F (36.7 C), temperature source Oral, resp. rate 18, height 5\' 6"  (1.676 m), weight 62.596 kg (138 lb), SpO2 99 %.  PHYSICAL EXAMINATION:   Physical Exam  Constitutional: She is oriented to person, place, and time and well-developed, well-nourished, and in no distress.  HENT:  Head: Normocephalic and atraumatic.  Neck: Normal range of motion. No thyromegaly present.  Cardiovascular: Normal rate and regular rhythm.   Pulmonary/Chest: Effort normal and breath sounds normal. No respiratory distress.  Abdominal: Soft. Bowel sounds are normal. She exhibits no distension. There is no tenderness.  Musculoskeletal: She exhibits no edema.  Wound vac in place over right hip  Neurological: She is alert and oriented to person, place, and time.  Psychiatric:  depressed   LABORATORY PANEL:   CBC  Recent Labs Lab 05/14/15 0407  WBC 5.0  HGB 8.3*  HCT 25.1*  PLT 216    ------------------------------------------------------------------------------------------------------------------  Chemistries   Recent Labs Lab 05/09/15 1819  05/14/15 0407  NA 135  < > 138  K 4.4  < > 4.2  CL 101  < > 108  CO2 26  < > 27  GLUCOSE 95  < > 92  BUN 22*  < > 16  CREATININE 0.49  < > 0.48  CALCIUM 8.4*  < > 8.1*  AST 17  --   --   ALT <5*  --   --   ALKPHOS 102  --   --   BILITOT 0.9  --   --   < > = values in this interval not displayed. ------------------------------------------------------------------------------------------------------------------  RADIOLOGY:  No results found.   ASSESSMENT AND PLAN:   * Acute blood loss anemia - due to hematoma of right hip, now s/p evaucation with wound vac in place - has received 2 U 5/18. 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  - Continue clindamycin and rifampin. Patient follows with Dr. Ola Spurr as an outpatient. - cultures NTD from this admission  * Parkinson's disease-follows with Dr. Manuella Ghazi. Continue home medications.  * depression - Psychiatry consultation yesterday, Dr. Curt Bears started medications which she was already on Abilify. She did not find this helpful. She does not wish to reconsult. She  is very anxious today when I inform her that medically she is ready for discharge to skilled nursing.  *UTI: Patient did have a positive UA and 5/19. Her and culture seems to be contaminated. She is completing a 5 day course of nitrofurantoin. Her last day will be tomorrow  CODE STATUS: Full  TOTAL TIME TAKING CARE OF THIS PATIENT: 40 minutes.   POSSIBLE D/C IN one DAYS, DEPENDING ON CLINICAL CONDITION. Care management searching for skilled nursing  facility bed   Myrtis Ser M.D on 05/14/2015   Between 7am to 6pm - Pager - [phone]  After 6pm go to [email or web address] - password [email or web address] [facility]  Office  [phone]  CC: Primary care physician; Idelle Crouch, MD

## 2015-05-14 NOTE — Progress Notes (Signed)
Patient is medically stable for D/C to [facility] today. Per [facility]iaison wound vac will be delivered at 4:15. Patient is going to private room 101. RN will call report and arrange EMS for 5 pm. RN will call patient's daughter in law Roselyn Reef when EMS arrives. Clinical Education officer, museum (CSW) prepared D/C packet and sent D/C Summary to [facility] via [facility] Patient is aware of above. CSW contacted patient's daughter in law Roselyn Reef and made her aware of above. Please reconsult if future social work needs arise. CSW signing off.   Blima Rich, [place] [phone]

## 2015-05-14 NOTE — Progress Notes (Signed)
Discharge Note:  Called report to Kim at [facility]. Pts VSS, waiting on EMS to transport pt to [facility].

## 2015-05-14 NOTE — Progress Notes (Signed)
Patient transferred from 1C to 1A. Patient is from [place]. PT is recommending SNF and patient is requiring a wound vac. CSW met with patient to discuss SNF options. Patient is agreeable to SNF search in [facility].   [facility]. CSW spoke with patient's daughter in law Roselyn Reef and daughter Hinton Dyer who are in agreement with [facility] going to [facility]. [facility] liaison is making arrangements for a wound vac. CSW will continue to follow and assist as needed.   Blima Rich, [place] [phone]

## 2015-05-14 NOTE — Progress Notes (Signed)
Pts VSS. No complaints of pain. Pt waiting on discharge to [facility].  Will continue to monitor.

## 2015-05-14 NOTE — Clinical Social Work Placement (Signed)
   CLINICAL SOCIAL WORK PLACEMENT  NOTE  Date:  05/14/2015  Patient Details  Name: Sabrina Duncan MRN: [id number] Date of Birth: 01/03/1941  Clinical Social Work is seeking post-discharge placement for this patient at the [place] level of care (*CSW will initial, date and re-position this form in  chart as items are completed):  Yes   Patient/family provided with [place] Work Department's list of facilities offering this level of care within the geographic area requested by the patient (or if unable, by the patient's family).  Yes   Patient/family informed of their freedom to choose among providers that offer the needed level of care, that participate in Medicare, Medicaid or managed care program needed by the patient, have an available bed and are willing to accept the patient.  Yes   Patient/family informed of [facility]'s ownership interest in [facility] and [facility], as well as of the fact that they are under no obligation to receive care at these facilities.  PASRR submitted to EDS on       PASRR number received on       Existing PASRR number confirmed on 05/14/15     FL2 transmitted to all facilities in geographic area requested by pt/family on 05/14/15     FL2 transmitted to all facilities within larger geographic area on       Patient informed that his/her managed care company has contracts with or will negotiate with certain facilities, including the following:        Yes   Patient/family informed of bed offers received.  [facility] bed at  ([facility] )     Physician recommends and patient chooses bed at      Patient to be transferred to  ([facility] ) on 05/14/15.  Patient to be transferred to facility by  (EMS )     Patient family notified on 05/14/15 of transfer.  Name of family member notified:   (Daughter in Chief Financial Officer )     PHYSICIAN       Additional Comment:     _______________________________________________ Loralyn Freshwater, LCSW 05/14/2015, 4:11 PM

## 2015-05-14 NOTE — Care Management Note (Signed)
Case Management Note  Patient Details  Name: Sabrina Duncan MRN: [id number] Date of Birth: 08/23/1941  Subjective/Objective:    [facility] Plan is SNF. CSW following.                 Action/Plan: It is anticipated pt will discharge tomorrow.   Expected Discharge Date:                  Expected Discharge Plan:  Skilled Nursing Facility  In-House Referral:  Clinical Social Work  Discharge planning Services     Post Acute Care Choice:    Choice offered to:     DME Arranged:    DME Agency:     HH Arranged:    [place] Agency:     Status of Service:     Medicare Important Message Given:  Yes Date Medicare IM Given:  05/10/15 Medicare IM give by:  Marshell Garfinkel Date Additional Medicare IM Given:  05/14/15 Additional Medicare Important Message give by:  Orvan July  If discussed at Long Length of Stay Meetings, dates discussed:    Additional Comments:  Jolly Mango, RN 05/14/2015, 2:15 PM

## 2015-05-14 NOTE — Discharge Summary (Signed)
[place] [facility] [place]   PATIENT NAME: Sabrina Duncan    MR#:  [id number]  DATE OF BIRTH:  Sep 25, 1941  DATE OF ADMISSION:  05/09/2015 ADMITTING PHYSICIAN: Lytle Butte, MD  DATE OF DISCHARGE: 05/14/2015  PRIMARY CARE PHYSICIAN: SPARKS,JEFFREY D, MD    ADMISSION DIAGNOSIS:  Hematoma [T14.8] Anemia, unspecified anemia type [D64.9]  DISCHARGE DIAGNOSIS:  Active Problems:   Acute blood loss anemia   Hematoma   Parkinson's disease   Generalized anxiety disorder   SECONDARY DIAGNOSIS:   Past Medical History  Diagnosis Date  . Depressive disorder, not elsewhere classified   . Unspecified osteomyelitis, site unspecified   . Polymyalgia rheumatica   . Paralysis agitans   . Osteoarthrosis, unspecified whether generalized or localized, unspecified site   . Muscle weakness (generalized)   . Difficulty in walking(719.7)   . Parkinson disease   . PMR (polymyalgia rheumatica)   . Coronary atherosclerosis of unspecified type of vessel, native or graft     2007: LAD PCI with a BMS, 80% distal LCX stenosis treated medically. Most recent stress test in 04/2012 showed distal anterio/apical scar? with no ischemia, EF 53%.   [name] Unspecified essential hypertension   . Other and unspecified hyperlipidemia   . Thoracic ascending aortic aneurysm     4.0 cm  . Complication of anesthesia     2013- hallucinations- [facility]. (-spinal anesth. 2013- no problems   redo- hip- L)  . Esophageal reflux     pt. reports that its resolved   . Neuromuscular disorder     parkinson's - [facility] - neurologist   . Cancer     left breast cancer- lumpectomy, chemo, radiation, tamoxifen , SLNBx  . Anemia, unspecified     bld. transfusion- 02/2013    HOSPITAL COURSE:    * Acute blood loss anemia on chronic anemia: Initial hemoglobin 5.8. - due to hematoma of right hip, now s/p evaucation with wound vac in place - has  received 2 U 5/18. 1 U 5/19 and 1 u 5/21 - hbg stable for greater than 24 hours, currently 8.4  * hematoma after recent hip surgery performed in [place] - Status post evacuation on 5/20 by Dr. 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  - Continue clindamycin and rifampin. Patient follows with Dr. Ola Spurr as an outpatient. - cultures NTD from this admission  * Parkinson's disease-stable. follows with Dr. Manuella Ghazi. Continue home medications.  * Last anxiety disorder: Psychiatry consultation yesterday, Dr. Curt Bears started medications which she was already on, which was not helpful. The patient does not wish to reconsult. She is very anxious today when I inform her that medically she is ready for discharge to skilled nursing. She is tearful  *UTI: Patient did have a positive UA and 5/19. Urine culture seems to be contaminated with Enterococcus faecalis. She is completing a 5 day course of nitrofurantoin today.    DISCHARGE CONDITIONS:   Fair  CONSULTS OBTAINED:  Treatment Team:  Hessie Knows, MD Erskine Speed, MD Aldean Jewett, MD Dewain Penning, MD Demetrios Loll, MD  DRUG ALLERGIES:   Allergies  Allergen Reactions  . Morphine And Related Other (See Comments)    Reaction:  Hallucinations   . Oxycodone Other (See Comments)  Reaction:  Unknown   . Sulfa Antibiotics Itching  . Tazobactam Other (See Comments)    Reaction:  Unknown   . Adhesive [Tape] Rash  . Isosorbide Rash    DISCHARGE MEDICATIONS:   Current Discharge Medication List    START taking these medications   Details  acetaminophen (TYLENOL) 325 MG tablet Take 2 tablets (650 mg total) by mouth every 6 (six) hours as needed for mild pain (or Fever >/= 101). Qty: 30 tablet, Refills: 0    senna (SENOKOT) 8.6 MG TABS tablet Take 1 tablet (8.6 mg  total) by mouth daily as needed for mild constipation. Qty: 120 each, Refills: 0      CONTINUE these medications which have CHANGED   Details  ARIPiprazole (ABILIFY) 5 MG tablet Take 1 tablet (5 mg total) by mouth daily. Qty: 30 tablet, Refills: 0    !! HYDROcodone-acetaminophen (NORCO/VICODIN) 5-325 MG per tablet Take 1 tablet by mouth every 8 (eight) hours as needed for severe pain. Qty: 30 tablet, Refills: 0    !! traMADol (ULTRAM) 50 MG tablet Take 1 tablet (50 mg total) by mouth every 6 (six) hours as needed for moderate pain. Qty: 30 tablet, Refills: 0     !! - Potential duplicate medications found. Please discuss with provider.    CONTINUE these medications which have NOT CHANGED   Details  atorvastatin (LIPITOR) 20 MG tablet Take 20 mg by mouth daily.    carbidopa-levodopa (SINEMET CR) 50-200 MG per tablet Take 2 tablets by mouth 4 (four) times daily.     carbidopa-levodopa-entacapone (STALEVO) 50-200-200 MG per tablet Take 1 tablet by mouth every evening.    Cholecalciferol (VITAMIN D3) 2000 UNITS capsule Take 2,000 Units by mouth every morning.     clindamycin (CLEOCIN) 300 MG capsule Take 1 capsule (300 mg total) by mouth 2 (two) times daily. Qty: 60 capsule, Refills: 3   Associated Diagnoses: Prosthetic hip infection, sequela    docusate sodium 100 MG CAPS Take 100 mg by mouth 2 (two) times daily. Qty: 30 capsule, Refills: 0    Ferrous Fumarate 324 (106 FE) MG TABS Take 1 tablet by mouth 2 (two) times daily.    !! HYDROcodone-acetaminophen (NORCO/VICODIN) 5-325 MG per tablet Take 1-2 tablets by mouth every 4 (four) hours as needed. Qty: 50 tablet, Refills: 0    metoprolol (LOPRESSOR) 50 MG tablet Take 50 mg by mouth 2 (two) times daily.     rifampin (RIFADIN) 300 MG capsule Take 300 mg by mouth 2 (two) times daily.    rOPINIRole (REQUIP) 2 MG tablet Take 2 mg by mouth 3 (three) times daily.    !! traMADol (ULTRAM) 50 MG tablet Take 50 mg by mouth daily as  needed for moderate pain.    venlafaxine XR (EFFEXOR-XR) 150 MG 24 hr capsule Take 150 mg by mouth 2 (two) times daily.     vitamin B-12 (CYANOCOBALAMIN) 1000 MCG tablet Take 1,000 mcg by mouth every morning.     !! - Potential duplicate medications found. Please discuss with provider.    STOP taking these medications     aspirin 81 MG chewable tablet      nitrofurantoin (MACRODANTIN) 50 MG capsule      aspirin EC 81 MG tablet          DISCHARGE INSTRUCTIONS:    DIET:  Regular diet  DISCHARGE CONDITION:  Fair  ACTIVITY:  Activity as tolerated per physical therapy recommendations  OXYGEN:  Home Oxygen: No.  Oxygen Delivery: room air  DISCHARGE LOCATION:  nurs[facility] home : [facility]  If you experience worsening of your admission symptoms, develop shortness of breath, life threatening emergency, suicidal or homicidal thoughts you must seek medical attention immediately by calling 911 or calling your MD immediately  if symptoms less severe.  You Must read complete instructions/literature along with all the possible adverse reactions/side effects for all the Medicines you take and that have been prescribed to you. Take any new Medicines after you have completely understood and accpet all the possible adverse reactions/side effects.   Please note  You were cared for by a hospitalist during your hospital stay. If you have any questions about your discharge medications or the care you received while you were in the hospital after you are discharged, you can call the unit and asked to speak with the hospitalist on call if the hospitalist that took care of you is not available. Once you are discharged, your primary care physician will handle any further medical issues. Please note that NO REFILLS for any discharge medications will be authorized once you are discharged, as it is imperative that you return to your primary care physician (or establish a relationship with a primary  care physician if you do not have one) for your aftercare needs so that they can reassess your need for medications and monitor your lab values.    Diet: As you were doing prior to hospitalization   Shower:  May shower but, NO SOAKING IN TUB.  Dressing:  Wound Vac should be changed twice weekly  Activity: Toe touch weight bearing right lower extremity with walker, No lifting or driving for 6 weeks.  Weight Bearing:  Toe touch weight bearing right lower extremity   To prevent constipation: you may use a stool softener such as -  Colace (over the counter) 100 mg by mouth twice a day  Drink plenty of fluids (prune juice may be helpful) and high fiber foods Miralax (over the counter) for constipation as needed.    Itching:  If you experience itching with your medications, try taking only a single pain pill, or even half a pain pill at a time.  You may take up to 10 pain pills per day, and you can also use benadryl over the counter for itching or also to help with sleep.   Precautions:  If you experience chest pain or shortness of breath - call 911 immediately for transfer to the hospital emergency department!!  If you develop a fever greater that 101 F, purulent drainage from wound, increased redness or drainage from wound, or calf pain -- Call [place]                                             Follow- Up Appointment:  Please call for an appointment to be seen in 1 week   Today   CHIEF COMPLAINT:   No complaints. Did not find a psychiatric consultation yesterday helpful. Anxious, tearful when she hears that she is medically cleared for discharge  HISTORY OF PRESENT ILLNESS:  Ashima Shrake is a 74 y.o. 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 Ola Spurr as her outpatient hemoglobin was low at 5.8. Patient had her hip surgery in [place] about 3 weeks ago. She did her rehabilitation in [place] and is just Coming back to [place] and the 3 days ago. Patient has had been very weak and tired with blurry vision worsening in the last 2 weeks. She has a history of transfusion dependent anemia and usually required transfusion after any of her surgeries. But this time but she did not get any transfusion after her hip surgery. It seems like she was on Lovenox for 2 weeks receiving even at the rehabilitation. 4 days ago she was having worsening swelling and Pain in the right hip region at her surgical site and patient then to the emergency room and a said that she has a hematoma at the site. She was asked to put some ice on. She did not have any follow up x-rays or scans. She flew from [place] at the same day to [place]. Her hip swelling has been about the same. Patient denies any active bleeding at this time. She has extensive ecchymosis even in the hamstring region of the leg with persistent hematoma of the right hip and neck ecchymosis extending into the leg below the knee. She went to follow up with Dr. Ola Spurr yesterday for her antibiotics which she is on for life for her prosthetic hip MRSA infection. Because of her tiredness labs were ordered and she was noted to have a hemoglobin of 5.8 today. So she was asked to come to the emergency room. Patient denies any chest pain or worsening dyspnea at this time. 2 units of packed RBC has already been ordered by the ER physician. Her guaiac is negative.   VITAL SIGNS:  Blood pressure 114/57, pulse 81, temperature 98.1 F (36.7 C), temperature source Oral, resp. rate 18, height 5\' 6"  (1.676 m),  weight 62.596 kg (138 lb), SpO2 99 %.  I/O:    Intake/Output Summary (Last 24 hours) at 05/14/15 1432 Last data filed at 05/14/15 1131  Gross per 24 hour  Intake    100 ml  Output   1300 ml  Net  -1200 ml    PHYSICAL EXAMINATION:  Physical Exam  Constitutional: She is oriented to person, place, and time and well-developed, well-nourished, and in no distress.  HENT:  Head: Normocephalic and atraumatic.  Neck: Normal range of motion. No thyromegaly present.  Cardiovascular: Normal rate and regular rhythm.  Pulmonary/Chest: Effort normal and breath sounds normal. No respiratory distress.  Abdominal: Soft. Bowel sounds are normal. She exhibits no distension. There is no tenderness.  Musculoskeletal: She exhibits no edema.  Wound vac in place over right hip  Neurological: She is alert and oriented to person, place, and time.  Psychiatric:  depressed   DATA REVIEW:   CBC  Recent Labs Lab 05/14/15 0407  WBC 5.0  HGB 8.3*  HCT 25.1*  PLT 216    Chemistries   Recent Labs Lab 05/09/15 1819  05/14/15 0407  NA 135  < > 138  K 4.4  < > 4.2  CL 101  < > 108  CO2 26  < > 27  GLUCOSE 95  < > 92  BUN 22*  < > 16  CREATININE 0.49  < > 0.48  CALCIUM 8.4*  < > 8.1*  AST 17  --   --  ALT <5*  --   --   ALKPHOS 102  --   --   BILITOT 0.9  --   --   < > = values in this interval not displayed.  Cardiac Enzymes No results for input(s): TROPONINI in the last 168 hours.  Microbiology Results  Results for orders placed or performed during the hospital encounter of 05/09/15  MRSA PCR Screening     Status: None   Collection Time: 05/10/15  3:46 AM  Result Value Ref Range Status   MRSA by PCR NEGATIVE NEGATIVE Final    Comment:        The GeneXpert MRSA Assay (FDA approved for NASAL specimens only), is one component of a comprehensive MRSA colonization surveillance program. It is not intended to diagnose MRSA infection nor to guide or monitor treatment for MRSA  infections.   Anaerobic culture     Status: None (Preliminary result)   Collection Time: 05/11/15  6:18 PM  Result Value Ref Range Status   Specimen Description WOUND  Final   Special Requests NONE  Final   Culture NO ANAEROBES ISOLATED  Final   Report Status PENDING  Incomplete  Culture, routine-abscess     Status: None (Preliminary result)   Collection Time: 05/11/15  6:18 PM  Result Value Ref Range Status   Specimen Description WOUND  Final   Special Requests NONE  Final   Gram Stain GROSSLY BLOODY RARE WBC SEEN NO ORGANISMS SEEN   Final   Culture NO GROWTH 3 DAYS  Final   Report Status PENDING  Incomplete  Anaerobic culture     Status: None (Preliminary result)   Collection Time: 05/11/15  6:20 PM  Result Value Ref Range Status   Specimen Description WOUND  Final   Special Requests NONE  Final   Culture NO ANAEROBES ISOLATED  Final   Report Status PENDING  Incomplete  Culture, routine-abscess     Status: None (Preliminary result)   Collection Time: 05/11/15  6:20 PM  Result Value Ref Range Status   Specimen Description WOUND  Final   Special Requests NONE  Final   Gram Stain FEW WBC SEEN NO ORGANISMS SEEN   Final   Culture NO GROWTH 3 DAYS  Final   Report Status PENDING  Incomplete    RADIOLOGY:  No results found.  EKG:   Orders placed or performed during the hospital encounter of 05/09/15  . EKG 12-Lead  . EKG 12-Lead      Management plans discussed with the patient, family and they are in agreement.  CODE STATUS:     Code Status Orders        Start     Ordered   05/09/15 2130  Full code   Continuous     05/09/15 2129    Advance Directive Documentation        Most Recent Value   Type of Advance Directive  Healthcare Power of Attorney   Pre-existing out of facility DNR order (yellow form or pink MOST form)     "MOST" Form in Place?        TOTAL TIME TAKING CARE OF THIS PATIENT: 45 minutes.    Myrtis Ser M.D on 05/14/2015 at 2:32  PM  Between 7am to 6pm - Pager - [phone]  After 6pm go to [email or web address] - password [email or web address] [facility] [facility]  Office  [phone]  CC: Primary care physician; Idelle Crouch, MD

## 2015-05-14 NOTE — Progress Notes (Signed)
Pts BP 107/64, pt refused to take scheduled metoprolol. MD Volanda Napoleon at the bedside and aware.

## 2015-05-14 NOTE — Progress Notes (Signed)
Patient has large hematoma which was evacuated Friday, wound VAC placed. Unfortunately the subcutaneous tissue had subsequent significant fat necrosis and the plate on the lateral proximal femur was communicated with the area that had drainage and now it is in continuity with the wound VAC. It is imperative that the wound did not be packed with a wound VAC that the wound VAC foam be placed superficially on the wound and not packed inside the wound to try to prevent contamination.

## 2015-05-14 NOTE — Progress Notes (Signed)
Nutrition Follow-up  DOCUMENTATION CODES:     INTERVENTION:  Other (Comment) (Meals and Snacks: Cater to pt preferences) Discontinue Ensure per patient preference  NUTRITION DIAGNOSIS:  Inadequate oral intake related to acute illness as evidenced by per patient/family report, energy intake < or equal to 75% for > or equal to 1 month, improved as per patient reporting a good appetite and eating 50% of her meals which "is a lot of food".  GOAL:  Patient will meet greater than or equal to 90% of their needs  MONITOR:   (Energy Intake, Anthropometrics, Skin, Electrolyte and Renal Profile,)  REASON FOR ASSESSMENT:  Other (Comment) (RD Follow Up)    ASSESSMENT:  Clinical Update: pt does not like Ensure Typical Food/ Fluid Intake: ~average 50% of meals recorded per I/O Weight Changes: reviewed Labs: Electrolyte and Renal Profile:    Recent Labs Lab 05/09/15 1819 05/10/15 0845 05/14/15 0407  BUN 22* 18 16  CREATININE 0.49 0.51 0.48  NA 135 136 138  K 4.4 4.3 4.2   Protein Profile:  Recent Labs Lab 05/09/15 1819  ALBUMIN 3.2*   Meds: Vit D, B12  Physical Findings: n/a   Height:  Ht Readings from Last 1 Encounters:  05/09/15 5\' 6"  (1.676 m)    Weight:  Wt Readings from Last 1 Encounters:  05/09/15 138 lb (62.596 kg)    Ideal Body Weight:     Wt Readings from Last 10 Encounters:  05/09/15 138 lb (62.596 kg)  10/02/14 143 lb 8 oz (65.091 kg)  02/13/14 149 lb 12 oz (67.926 kg)  09/22/13 154 lb 8 oz (70.081 kg)  09/21/13 155 lb (70.308 kg)  08/24/13 155 lb (70.308 kg)  07/11/13 153 lb 8 oz (69.627 kg)    BMI:  Body mass index is 22.28 kg/(m^2).  Estimated Nutritional Needs:  Kcal:  1390-1668 kcal/ day (BEE: 1158 x 1.2 AF x 1.0.-1.2 IF)  Protein:  75-88 g Pro/day (1.2-1.4 g/ kg/ day)  Fluid:  1567-1881 ml/ day (25-30 ml/kg)  Skin:  Wound (see comment) (Stage II, Coccyx)  Diet Order:  Diet regular Room service appropriate?: Yes; Fluid  consistency:: Thin  EDUCATION NEEDS:  No education needs identified at this time   Intake/Output Summary (Last 24 hours) at 05/14/15 1247 Last data filed at 05/14/15 1131  Gross per 24 hour  Intake    220 ml  Output   1300 ml  Net  -1080 ml    Last BM:  5/22  Roda Shutters, RDN Pager: [phone] Office: [place] [facility]

## 2015-05-14 NOTE — Clinical Social Work Placement (Signed)
   CLINICAL SOCIAL WORK PLACEMENT  NOTE  Date:  05/14/2015  Patient Details  Name: Sabrina Duncan MRN: [id number] Date of Birth: [birth year]  Clinical Social Work is seeking post-discharge placement for this patient at the [employer] level of care (*CSW will initial, date and re-position this form in  chart as items are completed):  Yes   Patient/family provided with [place] Work Department's list of facilities offering this level of care within the geographic area requested by the patient (or if unable, by the patient's family).  Yes   Patient/family informed of their freedom to choose among providers that offer the needed level of care, that participate in Medicare, Medicaid or managed care program needed by the patient, have an available bed and are willing to accept the patient.  Yes   Patient/family informed of [facility]'s ownership interest in [facility] and [facility], as well as of the fact that they are under no obligation to receive care at these facilities.  PASRR submitted to EDS on       PASRR number received on       Existing PASRR number confirmed on 05/14/15     FL2 transmitted to all facilities in geographic area requested by pt/family on 05/14/15     FL2 transmitted to all facilities within larger geographic area on       Patient informed that his/her managed care company has contracts with or will negotiate with certain facilities, including the following:        Yes   Patient/family informed of bed offers received.  [facility] )     Physician recommends and patient chooses bed at      Patient to be transferred to   on  .  Patient to be transferred to facility by       Patient family notified on   of transfer.  Name of family member notified:        PHYSICIAN       Additional Comment:    _______________________________________________ Loralyn Freshwater, LCSW 05/14/2015, 2:34  PM

## 2015-05-14 NOTE — Progress Notes (Signed)
   Subjective: 3 Days Post-Op Procedure(s) (LRB): EVACUATION HEMATOMA/RIGHT HIP HEMATOMA DRAINAGE WITH WOUND VAC (Right) Patient reports pain as mild.   Patient is well, and has had no acute complaints or problems We will start therapy today.  Plan is to go Skilled nursing facility after hospital stay.  Objective: Vital signs in last 24 hours: Temp:  [97.9 F (36.6 C)-98.6 F (37 C)] 98 F (36.7 C) (05/23 0736) Pulse Rate:  [69-78] 76 (05/23 0736) Resp:  [18] 18 (05/23 0736) BP: (101-123)/(47-61) 123/61 mmHg (05/23 0736) SpO2:  [98 %-100 %] 100 % (05/23 0736)  Intake/Output from previous day: 05/22 0701 - 05/23 0700 In: 460 [P.O.:360; IV Piggyback:100] Out: 1350 [Urine:1250; Drains:100] Intake/Output this shift:     Recent Labs  05/12/15 0422 05/13/15 0447 05/14/15 0407  HGB 6.8* 8.2* 8.3*    Recent Labs  05/13/15 0447 05/14/15 0407  WBC 4.9 5.0  RBC 2.94* 2.89*  HCT 25.7* 25.1*  PLT 201 216    Recent Labs  05/14/15 0407  NA 138  K 4.2  CL 108  CO2 27  BUN 16  CREATININE 0.48  GLUCOSE 92  CALCIUM 8.1*   No results for input(s): LABPT, INR in the last 72 hours.  EXAM General - Patient is Alert, Appropriate and Oriented Extremity - Neurologically intact Neurovascular intact Sensation intact distally Intact pulses distally No cellulitis present new wound vac applied today Dressing - dressing C/D/I Motor Function - + right foot drop  Past Medical History  Diagnosis Date  . Depressive disorder, not elsewhere classified   . Unspecified osteomyelitis, site unspecified   . Polymyalgia rheumatica   . Paralysis agitans   . Osteoarthrosis, unspecified whether generalized or localized, unspecified site   . Muscle weakness (generalized)   . Difficulty in walking(719.7)   . Parkinson disease   . PMR (polymyalgia rheumatica)   . Coronary atherosclerosis of unspecified type of vessel, native or graft     2007: LAD PCI with a BMS, 80% distal LCX  stenosis treated medically. Most recent stress test in 04/2012 showed distal anterio/apical scar? with no ischemia, EF 53%.   [name] Unspecified essential hypertension   . Other and unspecified hyperlipidemia   . Thoracic ascending aortic aneurysm     4.0 cm  . Complication of anesthesia     2013- hallucinations- [facility] (-spinal anesth. 2013- no problems   redo- hip- L)  . Esophageal reflux     pt. reports that its resolved   . Neuromuscular disorder     parkinson's - [facility] - neurologist   . Cancer     left breast cancer- lumpectomy, chemo, radiation, tamoxifen , SLNBx  . Anemia, unspecified     bld. transfusion- 02/2013    Assessment/Plan:   3 Days Post-Op Procedure(s) (LRB): EVACUATION HEMATOMA/RIGHT HIP HEMATOMA DRAINAGE WITH WOUND VAC (Right) Active Problems:   Anemia  Estimated body mass index is 22.28 kg/(m^2) as calculated from the following:   Height as of this encounter: 5\' 6"  (1.676 m).   Weight as of this encounter: 62.596 kg (138 lb). Advance diet Up with therapy , TTWB RLE Follow up with [place] [facility] in 7 days Continue with wound vac, change bi weekly      T. Rachelle Hora, PA-C [place] 05/14/2015, 8:00 AM

## 2015-05-14 NOTE — Progress Notes (Signed)
Physical Therapy Treatment Patient Details Name: Sabrina Duncan MRN: [id number] DOB: 1941-08-30 Today's Date: 05/14/2015    History of Present Illness presented to ER from outpatient appointment due to progressive weakness, tiredness and blurry vision, worsening, noted with HgB 5.8; admitted for symptomatic anemia.  Status post transfusion during hospitalization, HgB currently 6.8 and asymptomatic (cleared for OOB with therapy).  Hospital course additional significat for I and D of R hip hematoma (after R THR, posterior approach on 4/4 in [place]) with wound vac placement (5/20); to be TTWB R LE (noted with small fracture distal stem of R femoral component)    PT Comments    Pt progression toward goals. Encouraged continued performance of seated/long sit exercises several times throughout the day. Pt understands.   Follow Up Recommendations  SNF     Equipment Recommendations       Recommendations for Other Services       Precautions / Restrictions Restrictions Weight Bearing Restrictions: Yes RLE Weight Bearing: Touchdown weight bearing    Mobility  Bed Mobility                  Transfers Overall transfer level: Needs assistance Equipment used: Rolling walker (2 wheeled) Transfers: Sit to/from Stand Sit to Stand: Min guard            Ambulation/Gait Ambulation/Gait assistance: Min assist Ambulation Distance (Feet): 40 Feet Assistive device: Rolling walker (2 wheeled) Gait Pattern/deviations: Step-to pattern   Gait velocity interpretation: <1.8 ft/sec, indicative of risk for recurrent falls General Gait Details: Cues for only toe touch weightbearing on RLE and insructed on greater use of BUEs to do so; improved with cueing. Fatigues quickly. Did not use AFO on R to encourage toe toe and discourage weight through whole foot.   Stairs            Wheelchair Mobility    Modified Rankin (Stroke Patients Only)       Balance                                     Cognition Arousal/Alertness: Awake/alert Behavior During Therapy: WFL for tasks assessed/performed Overall Cognitive Status: Within Functional Limits for tasks assessed                      Exercises General Exercises - Lower Extremity Ankle Circles/Pumps: AROM;Both;20 reps;Seated (Limited ability on R) Quad Sets: Strengthening;Both;20 reps (Long sit) Gluteal Sets: Strengthening;Both;20 reps (long sit) Long Arc Quad: AROM;Both;20 reps;Seated Heel Slides: AROM;Both;20 reps (long sit) Hip ABduction/ADduction: AROM;Both;20 reps (long sit) Hip Flexion/Marching: AROM;Both;20 reps;Seated    General Comments        Pertinent Vitals/Pain Pain Assessment: 0-10 Pain Score: 1  Pain Location: R hip    Home Living                      Prior Function            PT Goals (current goals can now be found in the care plan section) Progress towards PT goals: Progressing toward goals    Frequency  7X/week    PT Plan Current plan remains appropriate    Co-evaluation             End of Session Equipment Utilized During Treatment: Gait belt Activity Tolerance: Patient tolerated treatment well;Patient limited by fatigue Patient left: in chair;with call bell/phone within reach;with chair  alarm set     Time: 4715-9539 PT Time Calculation (min) (ACUTE ONLY): 33 min  Charges:  $Gait Training: 8-22 mins $Therapeutic Exercise: 8-22 mins                    G Codes:      [name] 05/14/2015, 12:08 PM

## 2015-05-14 NOTE — Discharge Instructions (Signed)
Diet: As you were doing prior to hospitalization   Shower:  May shower but, NO SOAKING IN TUB.  Dressing:  Wound Vac should be changed twice weekly  Activity: Toe touch weight bearing right lower extremity with walker, No lifting or driving for 6 weeks.  Weight Bearing:  Toe touch weight bearing right lower extremity   To prevent constipation: you may use a stool softener such as -  Colace (over the counter) 100 mg by mouth twice a day  Drink plenty of fluids (prune juice may be helpful) and high fiber foods Miralax (over the counter) for constipation as needed.    Itching:  If you experience itching with your medications, try taking only a single pain pill, or even half a pain pill at a time.  You may take up to 10 pain pills per day, and you can also use benadryl over the counter for itching or also to help with sleep.   Precautions:  If you experience chest pain or shortness of breath - call 911 immediately for transfer to the hospital emergency department!!  If you develop a fever greater that 101 F, purulent drainage from wound, increased redness or drainage from wound, or calf pain -- Call [place]                                             Follow- Up Appointment:  Please call for an appointment to be seen in 1 week

## 2015-05-15 LAB — ANAEROBIC CULTURE

## 2015-05-15 LAB — TYPE AND SCREEN
ABO/RH(D): O POS
Antibody Screen: NEGATIVE
UNIT DIVISION: 0
UNIT DIVISION: 0
Unit division: 0
Unit division: 0

## 2015-05-15 LAB — CULTURE, ROUTINE-ABSCESS
CULTURE: NO GROWTH
Culture: NO GROWTH

## 2015-05-27 ENCOUNTER — Encounter: Payer: Self-pay | Admitting: Medical Oncology

## 2015-05-27 ENCOUNTER — Inpatient Hospital Stay
Admission: EM | Admit: 2015-05-27 | Discharge: 2015-05-29 | DRG: 560 | Disposition: A | Discharge status: Skilled Nursing Facility | Payer: Medicare Other | Attending: Internal Medicine | Admitting: Internal Medicine

## 2015-05-27 DIAGNOSIS — I712 Thoracic aortic aneurysm, without rupture: Secondary | ICD-10-CM | POA: Diagnosis present

## 2015-05-27 DIAGNOSIS — F419 Anxiety disorder, unspecified: Secondary | ICD-10-CM

## 2015-05-27 DIAGNOSIS — Z853 Personal history of malignant neoplasm of breast: Secondary | ICD-10-CM

## 2015-05-27 DIAGNOSIS — T8451XS Infection and inflammatory reaction due to internal right hip prosthesis, sequela: Secondary | ICD-10-CM | POA: Diagnosis not present

## 2015-05-27 DIAGNOSIS — D62 Acute posthemorrhagic anemia: Secondary | ICD-10-CM | POA: Diagnosis present

## 2015-05-27 DIAGNOSIS — Z8249 Family history of ischemic heart disease and other diseases of the circulatory system: Secondary | ICD-10-CM

## 2015-05-27 DIAGNOSIS — G20 Parkinson's disease: Secondary | ICD-10-CM | POA: Diagnosis present

## 2015-05-27 DIAGNOSIS — M1990 Unspecified osteoarthritis, unspecified site: Secondary | ICD-10-CM | POA: Diagnosis present

## 2015-05-27 DIAGNOSIS — E785 Hyperlipidemia, unspecified: Secondary | ICD-10-CM | POA: Diagnosis present

## 2015-05-27 DIAGNOSIS — Z96611 Presence of right artificial shoulder joint: Secondary | ICD-10-CM | POA: Diagnosis present

## 2015-05-27 DIAGNOSIS — Z87891 Personal history of nicotine dependence: Secondary | ICD-10-CM | POA: Diagnosis not present

## 2015-05-27 DIAGNOSIS — F329 Major depressive disorder, single episode, unspecified: Secondary | ICD-10-CM | POA: Diagnosis present

## 2015-05-27 DIAGNOSIS — D649 Anemia, unspecified: Secondary | ICD-10-CM | POA: Diagnosis present

## 2015-05-27 DIAGNOSIS — Y831 Surgical operation with implant of artificial internal device as the cause of abnormal reaction of the patient, or of later complication, without mention of misadventure at the time of the procedure: Secondary | ICD-10-CM | POA: Diagnosis present

## 2015-05-27 DIAGNOSIS — R339 Retention of urine, unspecified: Secondary | ICD-10-CM | POA: Diagnosis present

## 2015-05-27 DIAGNOSIS — I959 Hypotension, unspecified: Secondary | ICD-10-CM

## 2015-05-27 DIAGNOSIS — M353 Polymyalgia rheumatica: Secondary | ICD-10-CM | POA: Diagnosis present

## 2015-05-27 DIAGNOSIS — Z8614 Personal history of Methicillin resistant Staphylococcus aureus infection: Secondary | ICD-10-CM | POA: Diagnosis not present

## 2015-05-27 DIAGNOSIS — I2510 Atherosclerotic heart disease of native coronary artery without angina pectoris: Secondary | ICD-10-CM | POA: Diagnosis present

## 2015-05-27 DIAGNOSIS — I10 Essential (primary) hypertension: Secondary | ICD-10-CM | POA: Diagnosis present

## 2015-05-27 DIAGNOSIS — M86651 Other chronic osteomyelitis, right thigh: Secondary | ICD-10-CM | POA: Diagnosis present

## 2015-05-27 DIAGNOSIS — T8483XA Hemorrhage due to internal orthopedic prosthetic devices, implants and grafts, initial encounter: Secondary | ICD-10-CM | POA: Diagnosis present

## 2015-05-27 DIAGNOSIS — Z96643 Presence of artificial hip joint, bilateral: Secondary | ICD-10-CM | POA: Diagnosis present

## 2015-05-27 DIAGNOSIS — Z85820 Personal history of malignant melanoma of skin: Secondary | ICD-10-CM | POA: Diagnosis not present

## 2015-05-27 DIAGNOSIS — E43 Unspecified severe protein-calorie malnutrition: Secondary | ICD-10-CM | POA: Diagnosis present

## 2015-05-27 LAB — CBC WITH DIFFERENTIAL/PLATELET
Basophils Absolute: 0 10*3/uL (ref 0–0.1)
Basophils Relative: 1 %
Eosinophils Absolute: 0.1 10*3/uL (ref 0–0.7)
Eosinophils Relative: 2 %
HCT: 19.3 % — ABNORMAL LOW (ref 35.0–47.0)
Hemoglobin: 6.2 g/dL — ABNORMAL LOW (ref 12.0–16.0)
LYMPHS ABS: 0.7 10*3/uL — AB (ref 1.0–3.6)
Lymphocytes Relative: 17 %
MCH: 29 pg (ref 26.0–34.0)
MCHC: 32.3 g/dL (ref 32.0–36.0)
MCV: 89.6 fL (ref 80.0–100.0)
Monocytes Absolute: 0.4 10*3/uL (ref 0.2–0.9)
Monocytes Relative: 10 %
NEUTROS PCT: 70 %
Neutro Abs: 2.9 10*3/uL (ref 1.4–6.5)
Platelets: 315 10*3/uL (ref 150–440)
RBC: 2.16 MIL/uL — AB (ref 3.80–5.20)
RDW: 21.8 % — ABNORMAL HIGH (ref 11.5–14.5)
WBC: 4.1 10*3/uL (ref 3.6–11.0)

## 2015-05-27 LAB — COMPREHENSIVE METABOLIC PANEL
ALK PHOS: 77 U/L (ref 38–126)
ALT: <5 U/L — ABNORMAL LOW (ref 14–54)
AST: 30 U/L (ref 15–41)
Albumin: 2.6 g/dL — ABNORMAL LOW (ref 3.5–5.0)
Anion gap: 9 (ref 5–15)
BUN: 26 mg/dL — AB (ref 6–20)
CO2: 25 mmol/L (ref 22–32)
CREATININE: 0.94 mg/dL (ref 0.44–1.00)
Calcium: 8.1 mg/dL — ABNORMAL LOW (ref 8.9–10.3)
Chloride: 104 mmol/L (ref 101–111)
GFR calc non Af Amer: 60 mL/min — ABNORMAL LOW (ref >60)
GLUCOSE: 82 mg/dL (ref 65–99)
Potassium: 4.1 mmol/L (ref 3.5–5.1)
Sodium: 138 mmol/L (ref 135–145)
Total Bilirubin: 0.9 mg/dL (ref 0.3–1.2)
Total Protein: 5.7 g/dL — ABNORMAL LOW (ref 6.5–8.1)

## 2015-05-27 LAB — PREPARE RBC (CROSSMATCH)

## 2015-05-27 LAB — ABO/RH: ABO/RH(D): O POS

## 2015-05-27 LAB — TROPONIN I: Troponin I: 0.03 ng/mL (ref <0.031)

## 2015-05-27 MED ORDER — ATORVASTATIN CALCIUM 20 MG PO TABS
20.0000 mg | ORAL_TABLET | Freq: Every day | ORAL | Status: DC
  Administered 2015-05-27 – 2015-05-29 (×3): 20 mg via ORAL
  Filled 2015-05-27 (×3): qty 1

## 2015-05-27 MED ORDER — RIFAMPIN 300 MG PO CAPS
300.0000 mg | ORAL_CAPSULE | Freq: Two times a day (BID) | ORAL | Status: DC
  Administered 2015-05-27 – 2015-05-29 (×4): 300 mg via ORAL
  Filled 2015-05-27 (×5): qty 1

## 2015-05-27 MED ORDER — SODIUM CHLORIDE 0.9 % IV SOLN: 10.0000 mL/h | Freq: Once | INTRAVENOUS | Status: DC

## 2015-05-27 MED ORDER — ACETAMINOPHEN 650 MG RE SUPP: 650.0000 mg | Freq: Four times a day (QID) | RECTAL | Status: DC | PRN

## 2015-05-27 MED ORDER — ACETAMINOPHEN 325 MG PO TABS
650.0000 mg | ORAL_TABLET | Freq: Four times a day (QID) | ORAL | Status: DC | PRN
  Administered 2015-05-28: 650 mg via ORAL
  Filled 2015-05-27: qty 2

## 2015-05-27 MED ORDER — METOPROLOL TARTRATE 50 MG PO TABS
50.0000 mg | ORAL_TABLET | Freq: Two times a day (BID) | ORAL | Status: DC
  Administered 2015-05-27 – 2015-05-29 (×4): 50 mg via ORAL
  Filled 2015-05-27 (×4): qty 1

## 2015-05-27 MED ORDER — VENLAFAXINE HCL ER 75 MG PO CP24
150.0000 mg | ORAL_CAPSULE | Freq: Two times a day (BID) | ORAL | Status: DC
  Administered 2015-05-27 – 2015-05-29 (×4): 150 mg via ORAL
  Filled 2015-05-27 (×4): qty 2

## 2015-05-27 MED ORDER — HYDROCODONE-ACETAMINOPHEN 5-325 MG PO TABS: 1.0000 | ORAL_TABLET | ORAL | Status: DC | PRN

## 2015-05-27 MED ORDER — ONDANSETRON HCL 4 MG/2ML IJ SOLN: 4.0000 mg | Freq: Four times a day (QID) | INTRAMUSCULAR | Status: DC | PRN

## 2015-05-27 MED ORDER — TRAMADOL HCL 50 MG PO TABS: 50.0000 mg | ORAL_TABLET | Freq: Four times a day (QID) | ORAL | Status: DC | PRN

## 2015-05-27 MED ORDER — SODIUM CHLORIDE 0.9 % IJ SOLN
3.0000 mL | Freq: Two times a day (BID) | INTRAMUSCULAR | Status: DC
  Administered 2015-05-27 – 2015-05-29 (×4): 3 mL via INTRAVENOUS

## 2015-05-27 MED ORDER — ARIPIPRAZOLE 5 MG PO TABS
5.0000 mg | ORAL_TABLET | Freq: Every day | ORAL | Status: DC
  Administered 2015-05-27 – 2015-05-29 (×3): 5 mg via ORAL
  Filled 2015-05-27 (×3): qty 1

## 2015-05-27 MED ORDER — ENTACAPONE 200 MG PO TABS
200.0000 mg | ORAL_TABLET | Freq: Every evening | ORAL | Status: DC
  Administered 2015-05-27 – 2015-05-28 (×2): 200 mg via ORAL
  Filled 2015-05-27 (×2): qty 1

## 2015-05-27 MED ORDER — CLINDAMYCIN HCL 300 MG PO CAPS
300.0000 mg | ORAL_CAPSULE | Freq: Two times a day (BID) | ORAL | Status: DC
  Administered 2015-05-27 – 2015-05-29 (×4): 300 mg via ORAL
  Filled 2015-05-27 (×5): qty 1

## 2015-05-27 MED ORDER — DOCUSATE SODIUM 100 MG PO CAPS
100.0000 mg | ORAL_CAPSULE | Freq: Two times a day (BID) | ORAL | Status: DC
  Administered 2015-05-27 – 2015-05-29 (×4): 100 mg via ORAL
  Filled 2015-05-27 (×4): qty 1

## 2015-05-27 MED ORDER — HYDROCODONE-ACETAMINOPHEN 5-325 MG PO TABS
1.0000 | ORAL_TABLET | Freq: Three times a day (TID) | ORAL | Status: DC | PRN
Start: 2015-05-27 — End: 2015-05-27

## 2015-05-27 MED ORDER — ACETAMINOPHEN 325 MG PO TABS: 650.0000 mg | ORAL_TABLET | Freq: Four times a day (QID) | ORAL | Status: DC | PRN

## 2015-05-27 MED ORDER — ROPINIROLE HCL 1 MG PO TABS
2.0000 mg | ORAL_TABLET | Freq: Three times a day (TID) | ORAL | Status: DC
  Administered 2015-05-27 – 2015-05-29 (×5): 2 mg via ORAL
  Filled 2015-05-27 (×5): qty 2

## 2015-05-27 MED ORDER — SENNA 8.6 MG PO TABS
1.0000 | ORAL_TABLET | Freq: Every day | ORAL | Status: DC | PRN
Start: 2015-05-27 — End: 2015-05-29

## 2015-05-27 MED ORDER — CARBIDOPA-LEVODOPA-ENTACAPONE 50-200-200 MG PO TABS: 1.0000 | ORAL_TABLET | Freq: Every evening | ORAL | Status: DC

## 2015-05-27 MED ORDER — LORAZEPAM 2 MG/ML IJ SOLN
INTRAMUSCULAR | Status: AC
  Administered 2015-05-27: 0.5 mg
  Filled 2015-05-27: qty 1

## 2015-05-27 MED ORDER — ONDANSETRON HCL 4 MG PO TABS: 4.0000 mg | ORAL_TABLET | Freq: Four times a day (QID) | ORAL | Status: DC | PRN

## 2015-05-27 MED ORDER — CARBIDOPA-LEVODOPA ER 50-200 MG PO TBCR
2.0000 | EXTENDED_RELEASE_TABLET | Freq: Four times a day (QID) | ORAL | Status: DC
  Administered 2015-05-27 – 2015-05-29 (×6): 2 via ORAL
  Filled 2015-05-27 (×9): qty 2

## 2015-05-27 MED ORDER — LORAZEPAM 2 MG/ML IJ SOLN: 0.5000 mg | Freq: Once | INTRAMUSCULAR | Status: DC

## 2015-05-27 MED ORDER — VITAMIN B-12 1000 MCG PO TABS
1000.0000 ug | ORAL_TABLET | Freq: Every morning | ORAL | Status: DC
  Administered 2015-05-28 – 2015-05-29 (×2): 1000 ug via ORAL
  Filled 2015-05-27 (×2): qty 1

## 2015-05-27 MED ORDER — CARBIDOPA-LEVODOPA 25-100 MG PO TABS
2.0000 | ORAL_TABLET | Freq: Every evening | ORAL | Status: DC
  Administered 2015-05-27 – 2015-05-28 (×2): 2 via ORAL
  Filled 2015-05-27 (×2): qty 2

## 2015-05-27 MED ORDER — FERROUS FUMARATE 324 (106 FE) MG PO TABS
1.0000 | ORAL_TABLET | Freq: Two times a day (BID) | ORAL | Status: DC
  Administered 2015-05-27: 1 via ORAL
  Filled 2015-05-27 (×3): qty 1

## 2015-05-27 MED ORDER — ALUM & MAG HYDROXIDE-SIMETH 200-200-20 MG/5ML PO SUSP: 30.0000 mL | Freq: Four times a day (QID) | ORAL | Status: DC | PRN

## 2015-05-27 MED ORDER — TRAMADOL HCL 50 MG PO TABS: 50.0000 mg | ORAL_TABLET | Freq: Every day | ORAL | Status: DC | PRN

## 2015-05-27 NOTE — ED Notes (Signed)
Infuse emergent blood over 1 hour per Dr Wonda Horner

## 2015-05-27 NOTE — ED Notes (Signed)
[facility] with staff reports of pt having UTI sx's along with being told that she had low hgb. Pt denies pain, a&o.

## 2015-05-27 NOTE — Consult Note (Signed)
Reason for Consult:management of right hip wound vac, s/p I and D on 5/20 with Dr. Rudene Christians Referring Physician: medicine  Sabrina Duncan is an 74 y.o. female.  HPI: she is known to the orthopedic service, she is s/p I and D on 5/20 with Dr. Rudene Christians and had a wounc vac placed during surgery, it was changed at the bed side while she was in the hospital and was discharged to a facility where they have continued to change it at the bedside twice weekly and per the patient it was changed yesterday with no issues, overall her hematoma and hip feel better with less pressure/pain.  Denies any issues with the wound vac. She has not seen Dr. Rudene Christians in the clinic for follow up. The patient is readmitted for anemia and is undergoing a transfusion.  Past Medical History  Diagnosis Date  . Depressive disorder, not elsewhere classified   . Unspecified osteomyelitis, site unspecified   . Polymyalgia rheumatica   . Paralysis agitans   . Osteoarthrosis, unspecified whether generalized or localized, unspecified site   . Muscle weakness (generalized)   . Difficulty in walking(719.7)   . Parkinson disease   . PMR (polymyalgia rheumatica)   . Coronary atherosclerosis of unspecified type of vessel, native or graft     2007: LAD PCI with a BMS, 80% distal LCX stenosis treated medically. Most recent stress test in 04/2012 showed distal anterio/apical scar? with no ischemia, EF 53%.   [name] Unspecified essential hypertension   . Other and unspecified hyperlipidemia   . Thoracic ascending aortic aneurysm     4.0 cm  . Complication of anesthesia     2013- hallucinations- [facility] (-spinal anesth. 2013- no problems   redo- hip- L)  . Esophageal reflux     pt. reports that its resolved   . Neuromuscular disorder     parkinson's - [facility] - neurologist   . Cancer     left breast cancer- lumpectomy, chemo, radiation, tamoxifen , SLNBx  . Anemia, unspecified     bld. transfusion- 02/2013     Past Surgical History  Procedure Laterality Date  . Knee surgery Left   . Varicose vein surgery      both legs   . Melanoma excision Right     elbow  . Knee surgery Left   . Hip replacement Bilateral   . Foot surgery Right 2013  . Rotator cuff repair Right   . Cardiac catheterization  2007    [place], [place]; x1 stent  . Cardiac catheterization  9/14    [facility]  . Breast lumpectomy Left 1997  . Joint replacement      L-hipx5, R hipx2, Lkneex1,   . Tonsillectomy    . Cholecystectomy  2005    open repair - for gangernous   . Peripherally inserted central catheter insertion      for IV antibiotics related to repeated infection, post hip replacement  . Reverse shoulder arthroplasty Right 09/29/2013    Procedure: REVERSE SHOULDER ARTHROPLASTY;  Surgeon: Nita Sells, MD;  Location: [facility];  Service: Orthopedics;  Laterality: Right;  Right reverse total shoulder  . Hematoma evacuation Right 05/11/2015    Procedure: EVACUATION HEMATOMA/RIGHT HIP HEMATOMA DRAINAGE WITH WOUND VAC;  Surgeon: Hessie Knows, MD;  Location: [facility];  Service: Orthopedics;  Laterality: Right;    Family History  Problem Relation Age of Onset  . Hypertension Mother     Social History:  reports that she quit  smoking about 30 years ago. Her smoking use included Cigarettes. She has a 5 pack-year smoking history. She does not have any smokeless tobacco history on file. She reports that she drinks alcohol. She reports that she does not use illicit drugs.  Allergies:  Allergies  Allergen Reactions  . Morphine And Related Other (See Comments)    Reaction:  Hallucinations   . Oxycodone Other (See Comments)    Reaction:  Unknown   . Sulfa Antibiotics Itching  . Tazobactam Other (See Comments)    Reaction:  Unknown   . Adhesive [Tape] Rash  . Isosorbide Rash    Medications: I have reviewed the patient's current medications.  Results for orders placed or performed during the hospital encounter of  05/27/15 (from the past 48 hour(s))  Comprehensive metabolic panel     Status: Abnormal   Collection Time: 05/27/15  3:05 PM  Result Value Ref Range   Sodium 138 135 - 145 mmol/L   Potassium 4.1 3.5 - 5.1 mmol/L   Chloride 104 101 - 111 mmol/L   CO2 25 22 - 32 mmol/L   Glucose, Bld 82 65 - 99 mg/dL   BUN 26 (H) 6 - 20 mg/dL   Creatinine, Ser 0.94 0.44 - 1.00 mg/dL   Calcium 8.1 (L) 8.9 - 10.3 mg/dL   Total Protein 5.7 (L) 6.5 - 8.1 g/dL   Albumin 2.6 (L) 3.5 - 5.0 g/dL   AST 30 15 - 41 U/L   ALT <5 (L) 14 - 54 U/L   Alkaline Phosphatase 77 38 - 126 U/L   Total Bilirubin 0.9 0.3 - 1.2 mg/dL   GFR calc non Af Amer 60 (L) >60 mL/min   GFR calc Af Amer >60 >60 mL/min    Comment: (NOTE) The eGFR has been calculated using the CKD EPI equation. This calculation has not been validated in all clinical situations. eGFR's persistently <60 mL/min signify possible Chronic Kidney Disease.    Anion gap 9 5 - 15  CBC with Differential     Status: Abnormal   Collection Time: 05/27/15  3:05 PM  Result Value Ref Range   WBC 4.1 3.6 - 11.0 K/uL   RBC 2.16 (L) 3.80 - 5.20 MIL/uL   Hemoglobin 6.2 (L) 12.0 - 16.0 g/dL   HCT 19.3 (L) 35.0 - 47.0 %   MCV 89.6 80.0 - 100.0 fL   MCH 29.0 26.0 - 34.0 pg   MCHC 32.3 32.0 - 36.0 g/dL   RDW 21.8 (H) 11.5 - 14.5 %   Platelets 315 150 - 440 K/uL   Neutrophils Relative % 70 %   Neutro Abs 2.9 1.4 - 6.5 K/uL   Lymphocytes Relative 17 %   Lymphs Abs 0.7 (L) 1.0 - 3.6 K/uL   Monocytes Relative 10 %   Monocytes Absolute 0.4 0.2 - 0.9 K/uL   Eosinophils Relative 2 %   Eosinophils Absolute 0.1 0 - 0.7 K/uL   Basophils Relative 1 %   Basophils Absolute 0.0 0 - 0.1 K/uL  Troponin I     Status: None   Collection Time: 05/27/15  3:05 PM  Result Value Ref Range   Troponin I 0.03 <0.031 ng/mL    Comment:        NO INDICATION OF MYOCARDIAL INJURY.   Type and screen for Red Blood Exchange     Status: None (Preliminary result)   Collection Time: 05/27/15   3:45 PM  Result Value Ref Range   ABO/RH(D) O POS  Antibody Screen NEG    Sample Expiration 05/30/2015    Unit Number [id number]    Blood Component Type RBC LR PHER1    Unit division 00    Status of Unit ISSUED    Transfusion Status OK TO TRANSFUSE    Crossmatch Result Compatible   ABO/Rh     Status: None   Collection Time: 05/27/15  3:46 PM  Result Value Ref Range   ABO/RH(D) O POS   Prepare RBC     Status: None   Collection Time: 05/27/15  4:21 PM  Result Value Ref Range   Order Confirmation ORDER PROCESSED BY BLOOD BANK     No results found.  ROS Blood pressure 99/46, pulse 89, temperature 97.8 F (36.6 C), temperature source Oral, resp. rate 17, height 5' 6"  (1.676 m), weight 56.609 kg (124 lb 12.8 oz), SpO2 96 %. Physical Exam  Awake, alert and oriented Supine in hospital bed Right Leg: small wound vac intact over right hip holding suction at 128mHg, no surrounding erythema, no fluid collection appreciated, thigh compartments completely soft, non tender.  The patient has decreased sensation and no TA/EHL on the right leg, palp pulses distally..... Consistent with previously documented exams  Assessment/Plan: 74yo female s/p revision R THA surgery at OSH with subsequent Hematoma s/p I and D with wound vac placment on 5/20 with Dr. MRudene Christians 1. Anemia: mgmt per primary team, Hgb currently 6.2, undergoing PRBC transfusion 2. Wound vac: continue 1272mh continuous, I did not take down the wound vac given her benign exam, will need a vac change in 2-3 days at the bedside, have asked the nursing staff to obtain some wound vac supplies to have at the bed side for subsequent changes. 3. 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

## 2015-05-27 NOTE — H&P (Signed)
[facility] [place] NAME: Sabrina Duncan    MR#:  [id number]  DATE OF BIRTH:  06-Oct-1941  DATE OF ADMISSION:  05/27/2015  PRIMARY CARE PHYSICIAN: SPARKS,JEFFREY D, MD   REQUESTING/REFERRING PHYSICIAN:  Dr Making [facility] calls  CHIEF COMPLAINT:   Symptomatic anemia and sent from [facility] for low hemoglobin. HISTORY OF PRESENT ILLNESS:  Sabrina Duncan  is a 74 y.o. 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 Patient was hospitalized about a week and a half ago for acute blood loss anemia. She was found to have a large hematoma from her prosthetic joint. She had a drainage of the hematoma by Dr. Antionette Char and now has a wound VAC placed. Lab work was performed yesterday and her hemoglobin was low. She was asked to come to the emergency room for further evaluation. In the emergency department her hemoglobin is 6.4. She has consented for 2 units of PRBCs. As per the family the hematoma on the right leg has much improved. She denies melanoma or hematochezia.  PAST MEDICAL HISTORY:   Past Medical History  Diagnosis Date  . Depressive disorder, not elsewhere classified   . Unspecified osteomyelitis, site unspecified   . Polymyalgia rheumatica   . Paralysis agitans   . Osteoarthrosis, unspecified whether generalized or localized, unspecified site   . Muscle weakness (generalized)   . Difficulty in walking(719.7)   . Parkinson disease   . PMR (polymyalgia rheumatica)   . Coronary atherosclerosis of unspecified type of vessel, native or graft     2007: LAD PCI with a BMS, 80% distal LCX stenosis treated medically. Most recent stress test in 04/2012 showed distal anterio/apical scar? with no ischemia, EF 53%.   [name] Unspecified  essential hypertension   . Other and unspecified hyperlipidemia   . Thoracic ascending aortic aneurysm     4.0 cm  . Complication of anesthesia     2013- hallucinations- [facility]. (-spinal anesth. 2013- no problems   redo- hip- L)  . Esophageal reflux     pt. reports that its resolved   . Neuromuscular disorder     parkinson's - [facility] - neurologist   . Cancer     left breast cancer- lumpectomy, chemo, radiation, tamoxifen , SLNBx  . Anemia, unspecified     bld. transfusion- 02/2013   Large hematoma on the right lower extremity from prosthetic leg. She has a wound VAC placed. This was drained. PAST SURGICAL HISTORY:   Past Surgical History  Procedure Laterality Date  . Knee surgery Left   . Varicose vein surgery      both legs   . Melanoma excision Right     elbow  . Knee surgery Left   . Hip replacement Bilateral   . Foot surgery Right 2013  . Rotator cuff repair Right   . Cardiac catheterization  2007    [place], [place]; x1 stent  . Cardiac catheterization  9/14    [facility]  . Breast lumpectomy Left 1997  . Joint replacement      L-hipx5, R hipx2, Lkneex1,   . Tonsillectomy    . Cholecystectomy  2005    open repair - for gangernous   . Peripherally inserted central catheter insertion  for IV antibiotics related to repeated infection, post hip replacement  . Reverse shoulder arthroplasty Right 09/29/2013    Procedure: REVERSE SHOULDER ARTHROPLASTY;  Surgeon: Nita Sells, MD;  Location: [place];  Service: Orthopedics;  Laterality: Right;  Right reverse total shoulder  . Hematoma evacuation Right 05/11/2015    Procedure: EVACUATION HEMATOMA/RIGHT HIP HEMATOMA DRAINAGE WITH WOUND VAC;  Surgeon: Hessie Knows, MD;  Location: [facility];  Service: Orthopedics;  Laterality: Right;    SOCIAL HISTORY:   History  Substance Use Topics  . Smoking status: Former Smoker -- 0.50 packs/day for 10 years    Types: Cigarettes    Quit date: 09/26/1984   . Smokeless tobacco: Not on file  . Alcohol Use: Yes     Comment: rarely    FAMILY HISTORY:   Family History  Problem Relation Age of Onset  . Hypertension Mother     DRUG ALLERGIES:   Allergies  Allergen Reactions  . Morphine And Related Other (See Comments)    Reaction:  Hallucinations   . Oxycodone Other (See Comments)    Reaction:  Unknown   . Sulfa Antibiotics Itching  . Tazobactam Other (See Comments)    Reaction:  Unknown   . Adhesive [Tape] Rash  . Isosorbide Rash     REVIEW OF SYSTEMS:  CONSTITUTIONAL: No fever positive fatigue and weakness.  EYES: No blurred or double vision.  EARS, NOSE, AND THROAT: No tinnitus or ear pain.  RESPIRATORY: She has shortness of breath no cough or hemoptysis. No wheezing CARDIOVASCULAR: No chest pain, orthopnea, edema.  GASTROINTESTINAL: No nausea, vomiting, diarrhea or abdominal pain.  GENITOURINARY: Positive frequency ENDOCRINE: No polyuria, nocturia,  HEMATOLOGY: Positive anemia her hematoma has improved from last hospitalization.  SKIN: No rash or lesion. MUSCULOSKELETAL: She is wheelchair bound and has arthritis  NEUROLOGIC: No tingling, numbness, weakness. She has Parkinson's PSYCHIATRY: No anxiety or depression.   MEDICATIONS AT HOME:   Prior to Admission medications   Medication Sig Start Date End Date Taking? Authorizing Provider  acetaminophen (TYLENOL) 325 MG tablet Take 2 tablets (650 mg total) by mouth every 6 (six) hours as needed for mild pain (or Fever >/= 101). 05/14/15   Aldean Jewett, MD  ARIPiprazole (ABILIFY) 5 MG tablet Take 1 tablet (5 mg total) by mouth daily. 05/14/15   Aldean Jewett, MD  atorvastatin (LIPITOR) 20 MG tablet Take 20 mg by mouth daily.    Historical Provider, MD  carbidopa-levodopa (SINEMET CR) 50-200 MG per tablet Take 2 tablets by mouth 4 (four) times daily.     Historical Provider, MD  carbidopa-levodopa-entacapone (STALEVO) 50-200-200 MG per tablet Take 1 tablet by mouth  every evening.    Historical Provider, MD  Cholecalciferol (VITAMIN D3) 2000 UNITS capsule Take 2,000 Units by mouth every morning.     Historical Provider, MD  clindamycin (CLEOCIN) 300 MG capsule Take 1 capsule (300 mg total) by mouth 2 (two) times daily. 08/24/13   Carlyle Basques, MD  docusate sodium 100 MG CAPS Take 100 mg by mouth 2 (two) times daily. 10/03/13   Tania Ade, MD  Ferrous Fumarate 324 (106 FE) MG TABS Take 1 tablet by mouth 2 (two) times daily.    Historical Provider, MD  HYDROcodone-acetaminophen (NORCO/VICODIN) 5-325 MG per tablet Take 1-2 tablets by mouth every 4 (four) hours as needed. Patient taking differently: Take 1 tablet by mouth every 8 (eight) hours as needed for severe pain.  10/03/13   Tania Ade, MD  HYDROcodone-acetaminophen (NORCO/VICODIN)  5-325 MG per tablet Take 1 tablet by mouth every 8 (eight) hours as needed for severe pain. 05/14/15   Duanne Guess, PA-C  metoprolol (LOPRESSOR) 50 MG tablet Take 50 mg by mouth 2 (two) times daily.     Historical Provider, MD  rifampin (RIFADIN) 300 MG capsule Take 300 mg by mouth 2 (two) times daily.    Historical Provider, MD  rOPINIRole (REQUIP) 2 MG tablet Take 2 mg by mouth 3 (three) times daily.    Historical Provider, MD  senna (SENOKOT) 8.6 MG TABS tablet Take 1 tablet (8.6 mg total) by mouth daily as needed for mild constipation. 05/14/15   Aldean Jewett, MD  traMADol (ULTRAM) 50 MG tablet Take 50 mg by mouth daily as needed for moderate pain.    Historical Provider, MD  traMADol (ULTRAM) 50 MG tablet Take 1 tablet (50 mg total) by mouth every 6 (six) hours as needed for moderate pain. 05/14/15   Duanne Guess, PA-C  venlafaxine XR (EFFEXOR-XR) 150 MG 24 hr capsule Take 150 mg by mouth 2 (two) times daily.     Historical Provider, MD  vitamin B-12 (CYANOCOBALAMIN) 1000 MCG tablet Take 1,000 mcg by mouth every morning.    Historical Provider, MD      VITAL SIGNS:  Blood pressure 93/81, pulse 87,  temperature 98.1 F (36.7 C), temperature source Oral, resp. rate 20, height 5\' 6"  (1.676 m), weight 62.001 kg (136 lb 11 oz), SpO2 99 %.  PHYSICAL EXAMINATION:  GENERAL:  74 y.o.-year-old patient lying in the bed in Trendelenburg  EYES: Pupils equal, round, reactive to light and accommodation. No scleral icterus. Extraocular muscles intact.  HEENT: Head atraumatic, normocephalic. Oropharynx and nasopharynx clear.  NECK:  Supple, no jugular venous distention. No thyroid enlargement, no tenderness.  LUNGS: Normal breath sounds bilaterally, no wheezing, rales,rhonchi or crepitation. No use of accessory muscles of respiration.  CARDIOVASCULAR: S1, S2 normal. No murmurs, rubs, or gallops.  ABDOMEN: Soft, nontender, nondistended. Bowel sounds present. No organomegaly or mass.  EXTREMITIES: No pedal edema, cyanosis, or clubbing. Her toes are purple however it should be noted that apparently these are her birthmarks. She has good pulses bilaterally NEUROLOGIC: Cranial nerves II through XII are intact. Muscle strength 4/5 in all extremities. Sensation intact. Gait not checked.  PSYCHIATRIC: The patient is alert and oriented x 3.  SKIN: She has a wound VAC placed of the right hip. Her hematoma apparently has much improved. She has some bruising in the lower extremity.  LABORATORY PANEL:   CBC  Recent Labs Lab 05/27/15 1505  WBC 4.1  HGB 6.2*  HCT 19.3*  PLT 315   ------------------------------------------------------------------------------------------------------------------  Chemistries   Recent Labs Lab 05/27/15 1505  NA 138  K 4.1  CL 104  CO2 25  GLUCOSE 82  BUN 26*  CREATININE 0.94  CALCIUM 8.1*  AST 30  ALT <5*  ALKPHOS 77  BILITOT 0.9   ------------------------------------------------------------------------------------------------------------------  Cardiac Enzymes  Recent Labs Lab 05/27/15 1505  TROPONINI 0.03    ------------------------------------------------------------------------------------------------------------------  RADIOLOGY:  No results found.  EKG:  EKG is pending.  IMPRESSION AND PLAN:  This is a 74 year old female who was recently hospitalized for a large right hematoma after hip surgery who presents with symptomatic anemia.  1. Symptomatic acute on chronic anemia: This is likely secondary to her hematoma. She has a wound VAC placed. Her hemoglobin is low at 6.5. She has been consented for 2 units of PRBCs. I will consult Dr.  Mentzer further evaluation as he placed a wound VAC during last consultation. We will repeat a hemoglobin after blood transfusion. It should be noted the patient has had a complete GI workup including upper GI endoscopy and colonoscopy within the last 5 years and apparently there is no GI source of bleeding.  2. History of MRSA infection of the prosthesis: Patient is on lifelong into biotics and following Dr. Ola Spurr. We will continue outpatient medications including clindamycin and rifampin.  3. Parkinson's disease: Patient does follow with a neurologist, Dr. Manuella Ghazi. We'll continue all home medications.       All the records are reviewed and case discussed with ED provider. Management plans discussed with the patient and family and they are in agreement.  CODE STATUS: FULL  TOTAL TIME TAKING CARE OF THIS PATIENT: 45 minutes.    Yedidya Duddy M.D on 05/27/2015 at 5:48 PM  Between 7am to 6pm - Pager - [phone] After 6pm go to [email or web address] - password [email or web address] [facility]  Office  [phone]  CC: Primary care physician; Idelle Crouch, MD

## 2015-05-27 NOTE — ED Provider Notes (Signed)
[facility] Emergency Department Provider Note  ____________________________________________  Time seen: 1510  I have reviewed the triage vital signs and the nursing notes.   HISTORY  Chief Complaint Urinary Frequency  low blood count    HPI Sabrina Duncan is a 74 y.o. female who is been sent to the emergency department from [facility] due to her report of a low hemoglobin level and urinary frequency.  The patient had a blood sample drawn either late yesterday or early this morning area the hemoglobin level came back at 5 according to family. The patient recently had anemia requiring transfusion. This is presumed by the family to be due to a hematoma that is being drained on her right hip. The right hip hematoma followed a right hip replacement that was performed the beginning of April in [facility]. After the patient had recovered ROM the surgery and all looked well, she was being transported to [facility] when apparently the wound "opened up". This wound eventually received a wound vacuum. This was placed by Dr. Hessie Knows, orthopedics.   The patient has been recovering at [facility]. The family reports she has had a degree of anxiety and confusion. She has a little bit of paranoia. She was just started on Seroquel last night. She is on other psychiatric medications. She has Parkinson's.  Apparently the vacuum drain tube from the right wound was dislodged. The nurses at [facility] reported that there was a large amount of blood in the bed with the patient. This occurred yesterday.     Past Medical History  Diagnosis Date  . Depressive disorder, not elsewhere classified   . Unspecified osteomyelitis, site unspecified   . Polymyalgia rheumatica   . Paralysis agitans   . Osteoarthrosis, unspecified whether generalized or localized, unspecified site   . Muscle weakness (generalized)   . Difficulty in  walking(719.7)   . Parkinson disease   . PMR (polymyalgia rheumatica)   . Coronary atherosclerosis of unspecified type of vessel, native or graft     2007: LAD PCI with a BMS, 80% distal LCX stenosis treated medically. Most recent stress test in 04/2012 showed distal anterio/apical scar? with no ischemia, EF 53%.   [name] Unspecified essential hypertension   . Other and unspecified hyperlipidemia   . Thoracic ascending aortic aneurysm     4.0 cm  . Complication of anesthesia     2013- hallucinations- [facility]. (-spinal anesth. 2013- no problems   redo- hip- L)  . Esophageal reflux     pt. reports that its resolved   . Neuromuscular disorder     parkinson's - [facility] - neurologist   . Cancer     left breast cancer- lumpectomy, chemo, radiation, tamoxifen , SLNBx  . Anemia, unspecified     bld. transfusion- 02/2013    Patient Active Problem List   Diagnosis Date Noted  . Acute blood loss anemia 05/14/2015  . Hematoma 05/14/2015  . Parkinson's disease 05/14/2015  . Generalized anxiety disorder 05/14/2015  . Coronary atherosclerosis   . Essential hypertension   . Other and unspecified hyperlipidemia   . Thoracic ascending aortic aneurysm     Past Surgical History  Procedure Laterality Date  . Knee surgery Left   . Varicose vein surgery      both legs   . Melanoma excision Right     elbow  . Knee surgery Left   . Hip replacement Bilateral   .  Foot surgery Right 2013  . Rotator cuff repair Right   . Cardiac catheterization  2007    [place], [place]; x1 stent  . Cardiac catheterization  9/14    [facility]  . Breast lumpectomy Left 1997  . Joint replacement      L-hipx5, R hipx2, Lkneex1,   . Tonsillectomy    . Cholecystectomy  2005    open repair - for gangernous   . Peripherally inserted central catheter insertion      for IV antibiotics related to repeated infection, post hip replacement  . Reverse shoulder arthroplasty Right 09/29/2013    Procedure:  REVERSE SHOULDER ARTHROPLASTY;  Surgeon: Nita Sells, MD;  Location: [place];  Service: Orthopedics;  Laterality: Right;  Right reverse total shoulder  . Hematoma evacuation Right 05/11/2015    Procedure: EVACUATION HEMATOMA/RIGHT HIP HEMATOMA DRAINAGE WITH WOUND VAC;  Surgeon: Hessie Knows, MD;  Location: [facility];  Service: Orthopedics;  Laterality: Right;    Current Outpatient Rx  Name  Route  Sig  Dispense  Refill  . acetaminophen (TYLENOL) 325 MG tablet   Oral   Take 2 tablets (650 mg total) by mouth every 6 (six) hours as needed for mild pain (or Fever >/= 101).   30 tablet   0   . ARIPiprazole (ABILIFY) 5 MG tablet   Oral   Take 1 tablet (5 mg total) by mouth daily.   30 tablet   0   . atorvastatin (LIPITOR) 20 MG tablet   Oral   Take 20 mg by mouth daily.         . carbidopa-levodopa (SINEMET CR) 50-200 MG per tablet   Oral   Take 2 tablets by mouth 4 (four) times daily.          . carbidopa-levodopa-entacapone (STALEVO) 50-200-200 MG per tablet   Oral   Take 1 tablet by mouth every evening.         . Cholecalciferol (VITAMIN D3) 2000 UNITS capsule   Oral   Take 2,000 Units by mouth every morning.          . clindamycin (CLEOCIN) 300 MG capsule   Oral   Take 1 capsule (300 mg total) by mouth 2 (two) times daily.   60 capsule   3   . docusate sodium 100 MG CAPS   Oral   Take 100 mg by mouth 2 (two) times daily.   30 capsule   0   . Ferrous Fumarate 324 (106 FE) MG TABS   Oral   Take 1 tablet by mouth 2 (two) times daily.         [name] HYDROcodone-acetaminophen (NORCO/VICODIN) 5-325 MG per tablet   Oral   Take 1-2 tablets by mouth every 4 (four) hours as needed. Patient taking differently: Take 1 tablet by mouth every 8 (eight) hours as needed for severe pain.    50 tablet   0   . HYDROcodone-acetaminophen (NORCO/VICODIN) 5-325 MG per tablet   Oral   Take 1 tablet by mouth every 8 (eight) hours as needed for severe pain.   30 tablet    0   . metoprolol (LOPRESSOR) 50 MG tablet   Oral   Take 50 mg by mouth 2 (two) times daily.          . rifampin (RIFADIN) 300 MG capsule   Oral   Take 300 mg by mouth 2 (two) times daily.         [name] rOPINIRole (REQUIP) 2  MG tablet   Oral   Take 2 mg by mouth 3 (three) times daily.         [name] senna (SENOKOT) 8.6 MG TABS tablet   Oral   Take 1 tablet (8.6 mg total) by mouth daily as needed for mild constipation.   120 each   0   . traMADol (ULTRAM) 50 MG tablet   Oral   Take 50 mg by mouth daily as needed for moderate pain.         . traMADol (ULTRAM) 50 MG tablet   Oral   Take 1 tablet (50 mg total) by mouth every 6 (six) hours as needed for moderate pain.   30 tablet   0   . venlafaxine XR (EFFEXOR-XR) 150 MG 24 hr capsule   Oral   Take 150 mg by mouth 2 (two) times daily.          . vitamin B-12 (CYANOCOBALAMIN) 1000 MCG tablet   Oral   Take 1,000 mcg by mouth every morning.           Allergies Morphine and related; Oxycodone; Sulfa antibiotics; Tazobactam; Adhesive; and Isosorbide  Family History  Problem Relation Age of Onset  . Hypertension Mother     Social History History  Substance Use Topics  . Smoking status: Former Smoker -- 0.50 packs/day for 10 years    Types: Cigarettes    Quit date: 09/26/1984  . Smokeless tobacco: Not on file  . Alcohol Use: Yes     Comment: rarely    Review of Systems Limited review of systems due to the patient's mental status with anxiety and mild confusion. She reports the worst aspect of her condition currently is her own anxiety. She denies any pain. Constitutional: Negative for fever. ENT: Negative for sore throat. Cardiovascular: Negative for chest pain. Respiratory: Negative for shortness of breath. Gastrointestinal: Negative for abdominal pain, vomiting and diarrhea. Genitourinary: Negative for dysuria. Musculoskeletal: As noted, right hip replacement. See history of present illness Skin: Negative  for rash. Neurological: Negative for headaches Psychiatric: Some increased confusion and agitation recently. She is anxious. Family tells me she has been reporting that she thinks the nursing home is trying to kill her.   10-point ROS otherwise negative.  ____________________________________________   PHYSICAL EXAM:  VITAL SIGNS: ED Triage Vitals  Enc Vitals Group     BP 05/27/15 1434 97/43 mmHg     Pulse Rate 05/27/15 1434 91     Resp 05/27/15 1434 20     Temp 05/27/15 1434 98.3 F (36.8 C)     Temp Source 05/27/15 1434 Oral     SpO2 05/27/15 1434 100 %     Weight 05/27/15 1434 136 lb 11 oz (62.001 kg)     Height 05/27/15 1434 5\' 6"  (1.676 m)     Head Cir --      [facility] --      Pain Score --      Pain Loc --      Pain Edu? --      Excl. in [place]? --     Constitutional: alert, quiet, reserved. The family provides most of the history. The patient is communicative and able to confirm active. ENT   Head: Normocephalic and atraumatic.   Nose: No congestion/rhinnorhea.   Mouth/Throat: Mucous membranes are moist. Cardiovascular: Normal rate, regular rhythm. Respiratory: Normal respiratory effort without tachypnea. Breath sounds are clear and equal bilaterally. No wheezes/rales/rhonchi. Gastrointestinal: Soft and nontender. No distention.  Back: No muscle spasm, no tenderness, no CVA tenderness. Musculoskeletal: Nontender with normal range of motion in all extremities.  No noted edema. Neurologic:  Normal speech and language. No gross focal neurologic deficits are appreciated.  Skin: There is a wound VAC dressing on the right hip. It appears to be working appropriately. There is blood in the vacuum tubing. There is no erythema or swelling or tenderness around the right upper thigh or in the area of the wound VAC.  Psychiatric: As mentioned above, the patient is quiet and soft-spoken. She reports that she is feeling very nervous. The worst part about her feelings right  now is her anxiety, according to the patient. The family reports some increased confusion over the past few days.   ____________________________________________    LABS (pertinent positives/negatives)  Hemoglobin of 6.2, normal electrolytes. ____________________________________________   PROCEDURES  CRITICAL CARE Performed by: Ahmed Prima   Total critical care time: 45 minutes due to the critical nature of this patient's condition. She is anemic, she became hypotensive in the emergency department, and she required emergent transfusion of 1 unit of nonspecific type blood. We spoke the hospitalist. We had lengthy conversations with the family, including a phone call to the daughter-in-law.  Critical care time was exclusive of separately billable procedures and treating other patients.  Critical care was necessary to treat or prevent imminent or life-threatening deterioration.  Critical care was time spent personally by me on the following activities: development of treatment plan with patient and/or surrogate as well as nursing, discussions with consultants, evaluation of patient's response to treatment, examination of patient, obtaining history from patient or surrogate, ordering and performing treatments and interventions, ordering and review of laboratory studies, ordering and review of radiographic studies, pulse oximetry and re-evaluation of patient's condition.  ____________________________________________   INITIAL IMPRESSION / ASSESSMENT AND PLAN / ED COURSE  Patient with recent anemia requiring transfusion and now with a report of a low hemoglobin level. We will check her blood count. There is report of urinary frequency. We'll check urinalysis.  I have discussed the patient's anxiety with her. She agrees to some medication. We are treating her with Ativan 0.5 mg.  ----------------------------------------- 4:19 PM on  05/27/2015 -----------------------------------------  Patient's blood pressure has dropped to 66/36. Her heart rate is 78. She still responds to voice and stimulation. We are obtaining a second IV site and will infuse 1 L of normal saline to help the blood pressure. Given the report that her hemoglobin was as low as 5, I will begin emergent blood transfusion.   ----------------------------------------- 4:55 PM on 05/27/2015 -----------------------------------------  Immediately after my last note, I called the patient's daughter-in-law who was in the emergency department with her previously today. She agrees with the blood transfusion. At this point the patient continues to have some low blood pressure but it has improved some. Her blood pressure is now 90/41. The blood has been obtained and is about to be started now.  ----------------------------------------- 5:20 PM on 05/27/2015 -----------------------------------------  Results of her blood tests show a low hemoglobin at 6.2. We'll continue with the emergent transfusion of 1 unit and plan on further transfusions as needed with type-specific blood. Metabolic panel is overall unremarkable with a potassium level of 4.1. BUN is slightly elevated at 26. ____________________________________________   FINAL CLINICAL IMPRESSION(S) / ED DIAGNOSES  Final diagnoses:  Acute blood loss anemia  Hypotension, unspecified hypotension type  Parkinson's disease  Anxiety  emergent blood transfusion    Ahmed Prima,  MD 05/27/15 1737

## 2015-05-28 DIAGNOSIS — E43 Unspecified severe protein-calorie malnutrition: Secondary | ICD-10-CM | POA: Diagnosis present

## 2015-05-28 LAB — URINALYSIS COMPLETE WITH MICROSCOPIC (ARMC ONLY)
BILIRUBIN URINE: NEGATIVE
Bacteria, UA: NONE SEEN
Glucose, UA: NEGATIVE mg/dL
Hgb urine dipstick: NEGATIVE
Leukocytes, UA: NEGATIVE
NITRITE: NEGATIVE
Protein, ur: NEGATIVE mg/dL
SQUAMOUS EPITHELIAL / LPF: NONE SEEN
Specific Gravity, Urine: 1.02 (ref 1.005–1.030)
pH: 5 (ref 5.0–8.0)

## 2015-05-28 LAB — BASIC METABOLIC PANEL
Anion gap: 7 (ref 5–15)
BUN: 20 mg/dL (ref 6–20)
CO2: 22 mmol/L (ref 22–32)
Calcium: 7.8 mg/dL — ABNORMAL LOW (ref 8.9–10.3)
Chloride: 110 mmol/L (ref 101–111)
Creatinine, Ser: 0.59 mg/dL (ref 0.44–1.00)
GFR calc non Af Amer: >60 mL/min (ref >60)
Glucose, Bld: 81 mg/dL (ref 65–99)
Potassium: 4.1 mmol/L (ref 3.5–5.1)
SODIUM: 139 mmol/L (ref 135–145)

## 2015-05-28 LAB — CBC
HCT: 20.1 % — ABNORMAL LOW (ref 35.0–47.0)
HEMOGLOBIN: 6.6 g/dL — AB (ref 12.0–16.0)
MCH: 28.1 pg (ref 26.0–34.0)
MCHC: 32.5 g/dL (ref 32.0–36.0)
MCV: 86.4 fL (ref 80.0–100.0)
Platelets: 268 10*3/uL (ref 150–440)
RBC: 2.33 MIL/uL — ABNORMAL LOW (ref 3.80–5.20)
RDW: 21.5 % — AB (ref 11.5–14.5)
WBC: 5.3 10*3/uL (ref 3.6–11.0)

## 2015-05-28 LAB — PROTIME-INR
INR: 1.62
Prothrombin Time: 19.4 seconds — ABNORMAL HIGH (ref 11.4–15.0)

## 2015-05-28 LAB — PREPARE RBC (CROSSMATCH)

## 2015-05-28 LAB — APTT: aPTT: 42 seconds — ABNORMAL HIGH (ref 24–36)

## 2015-05-28 LAB — FIBRINOGEN: Fibrinogen: 605 mg/dL — ABNORMAL HIGH (ref 210–470)

## 2015-05-28 MED ORDER — FERROUS FUMARATE 325 (106 FE) MG PO TABS: 1.0000 | ORAL_TABLET | Freq: Two times a day (BID) | ORAL | Status: DC

## 2015-05-28 MED ORDER — FERROUS FUMARATE 325 (106 FE) MG PO TABS
1.0000 | ORAL_TABLET | Freq: Two times a day (BID) | ORAL | Status: DC
  Administered 2015-05-28: 1 via ORAL
  Administered 2015-05-28: 106 mg via ORAL
  Administered 2015-05-29: 1 via ORAL
  Filled 2015-05-28 (×5): qty 1

## 2015-05-28 MED ORDER — ENSURE ENLIVE PO LIQD
237.0000 mL | Freq: Two times a day (BID) | ORAL | Status: DC
  Administered 2015-05-28 – 2015-05-29 (×2): 237 mL via ORAL

## 2015-05-28 MED ORDER — SODIUM CHLORIDE 0.9 % IV SOLN
Freq: Once | INTRAVENOUS | Status: AC
  Administered 2015-05-28: 250 mL via INTRAVENOUS

## 2015-05-28 MED FILL — Ferrous Fumarate Tab 324 MG (106 MG Elemental Fe): ORAL | Qty: 1 | Status: AC

## 2015-05-28 NOTE — Discharge Summary (Addendum)
[facility] [place] NAME: Sabrina Duncan    MR#:  [id number]  DATE OF BIRTH:  1941-03-30  DATE OF ADMISSION:  05/27/2015 ADMITTING PHYSICIAN: Bettey Costa, MD  DATE OF DISCHARGE: No discharge date for patient encounter.  PRIMARY CARE PHYSICIAN: SPARKS,JEFFREY D, MD    ADMISSION DIAGNOSIS:  Acute blood loss anemia [D62] Anxiety [F41.9] Parkinson's disease [G20] Hypotension, unspecified hypotension type [I95.9]  DISCHARGE DIAGNOSIS:  Active Problems:   Anemia   SECONDARY DIAGNOSIS:   Past Medical History  Diagnosis Date  . Depressive disorder, not elsewhere classified   . Unspecified osteomyelitis, site unspecified   . Polymyalgia rheumatica   . Paralysis agitans   . Osteoarthrosis, unspecified whether generalized or localized, unspecified site   . Muscle weakness (generalized)   . Difficulty in walking(719.7)   . Parkinson disease   . PMR (polymyalgia rheumatica)   . Coronary atherosclerosis of unspecified type of vessel, native or graft     2007: LAD PCI with a BMS, 80% distal LCX stenosis treated medically. Most recent stress test in 04/2012 showed distal anterio/apical scar? with no ischemia, EF 53%.   [name] Unspecified essential hypertension   . Other and unspecified hyperlipidemia   . Thoracic ascending aortic aneurysm     4.0 cm  . Complication of anesthesia     2013- hallucinations- [facility] (-spinal anesth. 2013- no problems   redo- hip- L)  . Esophageal reflux     pt. reports that its resolved   . Neuromuscular disorder     parkinson's - [facility] - neurologist   . Cancer     left breast cancer- lumpectomy, chemo, radiation, tamoxifen , SLNBx  . Anemia, unspecified     bld. transfusion- 02/2013     ADMITTING HISTORY HISTORY OF PRESENT ILLNESS:  Sabrina Duncan is a 74 y.o. 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 Patient was hospitalized about a week and a half ago for acute blood loss anemia. She was found to have a large hematoma from her prosthetic joint. She had a drainage of the hematoma by Dr. Antionette Char and now has a wound VAC placed. Lab work was performed yesterday and her hemoglobin was low. She was asked to come to the emergency room for further evaluation. In the emergency department her hemoglobin is 6.4. She has consented for 2 units of PRBCs. As per the family the hematoma on the right leg has much improved. She denies melanoma or hematochezia.        HOSPITAL COURSE:   Patient admitted onto the medical floor for anemia. Transfuse total of 3 units of packed RBC. Her blood loss was due to slow chronic loss in the wound VAC and also acute loss at the nursing home when her wound VAC was dislodged. She was found in a pool of blood in her bed. Patient was seen by orthopedics and no procedures were scheduled. With stable hemoglobin patient is being started on iron supplementation and referral to the [place] for future blood transfusions as needed.  Patient had stool or color done which was negative along with prior EGD and colonoscopy. GI bleed has been ruled out.  Some urinary retention had to be straight cathed once during hospital stay. No urinary  issues today morning.   CONSULTS OBTAINED:   Orthopedics  DRUG ALLERGIES:   Allergies  Allergen Reactions  . Morphine And Related Other (See Comments)    Reaction:  Hallucinations   . Oxycodone Other (See Comments)    Reaction:  Unknown   . Sulfa Antibiotics Itching  . Tazobactam Other (See Comments)    Reaction:  Unknown   . Adhesive [Tape] Rash  . Isosorbide Rash    DISCHARGE MEDICATIONS:   Current Discharge Medication List    START taking these medications    Details  !! ferrous fumarate (HEMOCYTE - 106 MG FE) 325 (106 FE) MG TABS tablet Take 1 tablet (106 mg of iron total) by mouth 2 (two) times daily. Qty: 30 each, Refills: 0     !! - Potential duplicate medications found. Please discuss with provider.    CONTINUE these medications which have NOT CHANGED   Details  acetaminophen (TYLENOL) 325 MG tablet Take 2 tablets (650 mg total) by mouth every 6 (six) hours as needed for mild pain (or Fever >/= 101). Qty: 30 tablet, Refills: 0    ARIPiprazole (ABILIFY) 5 MG tablet Take 1 tablet (5 mg total) by mouth daily. Qty: 30 tablet, Refills: 0    atorvastatin (LIPITOR) 20 MG tablet Take 20 mg by mouth at bedtime.     buPROPion (WELLBUTRIN) 100 MG tablet Take 100 mg by mouth daily.    carbidopa-levodopa (SINEMET CR) 50-200 MG per tablet Take 2 tablets by mouth 4 (four) times daily.    carbidopa-levodopa-entacapone (STALEVO) 50-200-200 MG per tablet Take 1 tablet by mouth at bedtime.     Cholecalciferol 1000 UNITS tablet Take 2,000 Units by mouth daily.    clindamycin (CLEOCIN) 300 MG capsule Take 1 capsule (300 mg total) by mouth 2 (two) times daily. Qty: 60 capsule, Refills: 3   Associated Diagnoses: Prosthetic hip infection, sequela    docusate sodium 100 MG CAPS Take 100 mg by mouth 2 (two) times daily. Qty: 30 capsule, Refills: 0    !! Ferrous Fumarate 324 (106 FE) MG TABS Take 1 tablet by mouth 2 (two) times daily.    metoprolol (LOPRESSOR) 50 MG tablet Take 50 mg by mouth 2 (two) times daily.     polyethylene glycol powder (GLYCOLAX/MIRALAX) powder Take 17 g by mouth daily as needed for mild constipation or moderate constipation.    QUEtiapine (SEROQUEL) 25 MG tablet Take 25 mg by mouth at bedtime.    rifampin (RIFADIN) 300 MG capsule Take 300 mg by mouth 2 (two) times daily.    rOPINIRole (REQUIP) 2 MG tablet Take 2 mg by mouth 3 (three) times daily.    senna (SENOKOT) 8.6 MG TABS tablet Take 1 tablet (8.6 mg total) by mouth  daily as needed for mild constipation. Qty: 120 each, Refills: 0    traMADol (ULTRAM) 50 MG tablet Take 1 tablet (50 mg total) by mouth every 6 (six) hours as needed for moderate pain. Qty: 30 tablet, Refills: 0    venlafaxine XR (EFFEXOR-XR) 150 MG 24 hr capsule Take 150 mg by mouth 2 (two) times daily.     vitamin B-12 (CYANOCOBALAMIN) 1000 MCG tablet Take 1,000 mcg by mouth daily.      !! - Potential duplicate medications found. Please discuss with provider.    STOP taking these medications     HYDROcodone-acetaminophen (NORCO/VICODIN) 5-325 MG per tablet          DISCHARGE INSTRUCTIONS:   DIET:  Cardiac diet  DISCHARGE  CONDITION:  Stable  ACTIVITY:  Activity as tolerated  OXYGEN:  Home Oxygen: No.   Oxygen Delivery: room air  DISCHARGE LOCATION:  nursing home   If you experience worsening of your admission symptoms, develop shortness of breath, life threatening emergency, suicidal or homicidal thoughts you must seek medical attention immediately by calling 911 or calling your MD immediately  if symptoms less severe.  You Must read complete instructions/literature along with all the possible adverse reactions/side effects for all the Medicines you take and that have been prescribed to you. Take any new Medicines after you have completely understood and accpet all the possible adverse reactions/side effects.   Please note  You were cared for by a hospitalist during your hospital stay. If you have any questions about your discharge medications or the care you received while you were in the hospital after you are discharged, you can call the unit and asked to speak with the hospitalist on call if the hospitalist that took care of you is not available. Once you are discharged, your primary care physician will handle any further medical issues. Please note that NO REFILLS for any discharge medications will be authorized once you are discharged, as it is imperative that you  return to your primary care physician (or establish a relationship with a primary care physician if you do not have one) for your aftercare needs so that they can reassess your need for medications and monitor your lab values.     Today    VITAL SIGNS:  Blood pressure 104/55, pulse 63, temperature 98.2 F (36.8 C), temperature source Oral, resp. rate 18, height 5\' 6"  (1.676 m), weight 57.698 kg (127 lb 3.2 oz), SpO2 100 %.  I/O:   Intake/Output Summary (Last 24 hours) at 05/28/15 1240 Last data filed at 05/28/15 1128  Gross per 24 hour  Intake    580 ml  Output      0 ml  Net    580 ml    PHYSICAL EXAMINATION:  Physical Exam  GENERAL:  73 y.o.-year-old patient lying in the bed with no acute distress.  LUNGS: Normal breath sounds bilaterally, no wheezing, rales,rhonchi or crepitation. No use of accessory muscles of respiration.  CARDIOVASCULAR: S1, S2 normal. No murmurs, rubs, or gallops.  ABDOMEN: Soft, non-tender, non-distended. Bowel sounds present. No organomegaly or mass.  NEUROLOGIC: Moves all 4 extremities. PSYCHIATRIC: The patient is alert and oriented x 3.  SKIN: No obvious rash, lesion, or ulcer.   DATA REVIEW:   CBC  Recent Labs Lab 05/28/15 0340  WBC 5.3  HGB 6.6*  HCT 20.1*  PLT 268    Chemistries   Recent Labs Lab 05/27/15 1505 05/28/15 0340  NA 138 139  K 4.1 4.1  CL 104 110  CO2 25 22  GLUCOSE 82 81  BUN 26* 20  CREATININE 0.94 0.59  CALCIUM 8.1* 7.8*  AST 30  --   ALT <5*  --   ALKPHOS 77  --   BILITOT 0.9  --     Cardiac Enzymes  Recent Labs Lab 05/27/15 1505  TROPONINI 0.03    Microbiology Results  Results for orders placed or performed during the hospital encounter of 05/09/15  MRSA PCR Screening     Status: None   Collection Time: 05/10/15  3:46 AM  Result Value Ref Range Status   MRSA by PCR NEGATIVE NEGATIVE Final    Comment:        The GeneXpert MRSA Assay (FDA approved  for NASAL specimens only), is one  component of a comprehensive MRSA colonization surveillance program. It is not intended to diagnose MRSA infection nor to guide or monitor treatment for MRSA infections.   Anaerobic culture     Status: None   Collection Time: 05/11/15  6:18 PM  Result Value Ref Range Status   Specimen Description WOUND  Final   Special Requests NONE  Final   Culture NO ANAEROBES ISOLATED  Final   Report Status 05/15/2015 FINAL  Final  Culture, routine-abscess     Status: None   Collection Time: 05/11/15  6:18 PM  Result Value Ref Range Status   Specimen Description WOUND  Final   Special Requests NONE  Final   Gram Stain GROSSLY BLOODY RARE WBC SEEN NO ORGANISMS SEEN   Final   Culture NO GROWTH 4 DAYS  Final   Report Status 05/15/2015 FINAL  Final  Anaerobic culture     Status: None   Collection Time: 05/11/15  6:20 PM  Result Value Ref Range Status   Specimen Description WOUND  Final   Special Requests NONE  Final   Culture NO ANAEROBES ISOLATED  Final   Report Status 05/15/2015 FINAL  Final  Culture, routine-abscess     Status: None   Collection Time: 05/11/15  6:20 PM  Result Value Ref Range Status   Specimen Description WOUND  Final   Special Requests NONE  Final   Gram Stain FEW WBC SEEN NO ORGANISMS SEEN   Final   Culture NO GROWTH 4 DAYS  Final   Report Status 05/15/2015 FINAL  Final    RADIOLOGY:  No results found.    Management plans discussed with the patient, family and they are in agreement.  CODE STATUS:     Code Status Orders        Start     Ordered   05/27/15 1937  Full code   Continuous     05/27/15 1937      TOTAL TIME TAKING CARE OF THIS PATIENT ON DAY OF DISCHARGE: 40 minutes.    Hillary Bow R M.D on 05/29/2015 at 9.40 AM  Between 7am to 6pm - Pager - [phone]  After 6pm go to [email or web address] - password [email or web address] [facility]  Office  [phone]  CC: Primary care physician; Idelle Crouch, MD

## 2015-05-28 NOTE — Clinical Social Work Note (Signed)
Clinical Social Work Assessment  Patient Details  Name: Sabrina Duncan MRN: [id number] Date of Birth: 04/28/1941  Date of referral:  05/28/15               Reason for consult:  Facility Placement, Other (Comment Required) (pt is from [facility] for STR)                Permission sought to share information with:  Facility Sport and exercise psychologist, Family Supports Permission granted to share information::  Yes, Verbal Permission Granted  Name::        Agency::     Relationship::     Contact Information:     Housing/Transportation Living arrangements for the past 2 months:  [place], [place] of Information:  Patient, Adult Children Patient Interpreter Needed:  None Criminal Activity/Legal Involvement Pertinent to Current Situation/Hospitalization:  No - Comment as needed Significant Relationships:  Adult Children Lives with:  Facility Resident (temporarily staying in SNF for rehab.) Do you feel safe going back to the place where you live?    Need for family participation in patient care:  Yes (Comment)  Care giving concerns:  Pt was awake and confused when CSW was speaking with her.  Current concerns are pt going back to SNF.     Social Worker assessment / plan:  CSW spoke to pt, at time of assessment pt was only oriented to herself.  CSW spoke to pt's daughter Sabrina Duncan, and she is in agreement with pt's returning to SNF at [facility] to continue her rehab.  CSW will continue to follow.  Employment status:  Retired Forensic scientist:  Medicare PT Recommendations:    Information / Referral to community resources:     Patient/Family's Response to care:   Family was in agreement with pt returning to [facility] when she was medically stable.  Patient/Family's Understanding of and Emotional Response to Diagnosis, Current Treatment, and Prognosis:  Pt's daughter understood that pt would return to [facility] to complete her rehab, once she  was medically stable to do so.    Emotional Assessment Appearance:  Appears older than stated age Attitude/Demeanor/Rapport:   (pt is a little confused at this time ) Affect (typically observed):    Orientation:  Oriented to Self Alcohol / Substance use:  Never Used Psych involvement (Current and /or in the community):  No (Comment)  Discharge Needs  Concerns to be addressed:    Readmission within the last 30 days:  Yes Current discharge risk:  Physical Impairment Barriers to Discharge:      Sabrina Argyle, LCSW 05/28/2015, 12:22 PM

## 2015-05-28 NOTE — Progress Notes (Signed)
Patient received 2 unit of prbc , no reaction noted, po intake very poor , periods of confusion noted at times, wound vac in use and functioning , care ongoing

## 2015-05-28 NOTE — Progress Notes (Signed)
Initial Nutrition Assessment  DOCUMENTATION CODES:  Severe malnutrition in context of acute illness/injury  INTERVENTION:   (Medical nutrition supplement: ) Ensure Enlive po BID, each supplement provides 350 kcal and 20 grams of protein   NUTRITION DIAGNOSIS:  Inadequate oral intake related to acute illness as evidenced by per patient/family report, percent weight loss.    GOAL:  Patient will meet greater than or equal to 90% of their needs    MONITOR:   (Energy intake, Electrolyte and renal profile)  REASON FOR ASSESSMENT:  Malnutrition Screening Tool    ASSESSMENT:  Pt admitted with anemia. Recent placement of right hip wound s/p I and D on 05/11/15  Past Medical History  Diagnosis Date  . Depressive disorder, not elsewhere classified   . Unspecified osteomyelitis, site unspecified   . Polymyalgia rheumatica   . Paralysis agitans   . Osteoarthrosis, unspecified whether generalized or localized, unspecified site   . Muscle weakness (generalized)   . Difficulty in walking(719.7)   . Parkinson disease   . PMR (polymyalgia rheumatica)   . Coronary atherosclerosis of unspecified type of vessel, native or graft     2007: LAD PCI with a BMS, 80% distal LCX stenosis treated medically. Most recent stress test in 04/2012 showed distal anterio/apical scar? with no ischemia, EF 53%.   [name] Unspecified essential hypertension   . Other and unspecified hyperlipidemia   . Thoracic ascending aortic aneurysm     4.0 cm  . Complication of anesthesia     2013- hallucinations- [facility] (-spinal anesth. 2013- no problems   redo- hip- L)  . Esophageal reflux     pt. reports that its resolved   . Neuromuscular disorder     parkinson's - [facility] - neurologist   . Cancer     left breast cancer- lumpectomy, chemo, radiation, tamoxifen , SLNBx  . Anemia, unspecified     bld. transfusion- 02/2013   Current intake: Pt reports did not eat breakfast.  Noted  intake for dinner last night of 75% of meals  Intake prior to admission: Reports poor po intake for the last month, eating bites at meal times.  Drinking some boost and ensure at times. Poor po intake secondary to not feeling well  Height:  Ht Readings from Last 1 Encounters:  05/27/15 5\' 6"  (1.676 m)    Weight:  Wt Readings from Last 1 Encounters:  05/28/15 127 lb 3.2 oz (57.698 kg)    Pt reports weight loss prior to admission  Per encounters weight loss of 7% over last 1 month    Wt Readings from Last 10 Encounters:  05/28/15 127 lb 3.2 oz (57.698 kg)  05/09/15 138 lb (62.596 kg)  10/02/14 143 lb 8 oz (65.091 kg)  02/13/14 149 lb 12 oz (67.926 kg)  09/22/13 154 lb 8 oz (70.081 kg)  09/21/13 155 lb (70.308 kg)  08/24/13 155 lb (70.308 kg)  07/11/13 153 lb 8 oz (69.627 kg)   Medications: Fe sulfate, senna, vit B12, colace  Labs: Electrolyte and Renal Profile:  Recent Labs Lab 05/27/15 1505 05/28/15 0340  BUN 26* 20  CREATININE 0.94 0.59  NA 138 139  K 4.1 4.1   Nutritional Anemia Profile:  CBC Latest Ref Rng 05/28/2015 05/27/2015 05/14/2015  WBC 3.6 - 11.0 K/uL 5.3 4.1 5.0  Hemoglobin 12.0 - 16.0 g/dL 6.6(L) 6.2(L) 8.3(L)  Hematocrit 35.0 - 47.0 % 20.1(L) 19.3(L) 25.1(L)  Platelets 150 - 440 K/uL 268 315 216  Nutrition-Focused physical exam completed. Findings are mild fat depletion in upper arm, mild/moderate muscle depletion in clavicle area, and hand area, and unable to assess edema.     BMI:  Body mass index is 20.54 kg/(m^2).  Estimated Nutritional Needs:  Kcal:  BEE 1111 kcals (IF 1.0-1.2, AF 1.2) 8335-8251 kcals/d.   Protein:  (1.2-1.5 g/d) 70-87 g/d  Fluid:  (25-45ml/kg) 1450-178ml/d  Skin:     Diet Order:  Diet Heart Room service appropriate?: Yes; Fluid consistency:: Thin Diet - low sodium heart healthy  EDUCATION NEEDS:  No education needs identified at this time   Intake/Output Summary (Last 24 hours) at 05/28/15 1253 Last data  filed at 05/28/15 1128  Gross per 24 hour  Intake    580 ml  Output      0 ml  Net    580 ml    Last BM:  6/5 HIGH Care Level Jarmal Lewelling B. Zenia Resides, [place], [place] (pager)

## 2015-05-28 NOTE — Progress Notes (Signed)
[facility] [place] NAME: Kishana Battey    MR#:  [id number]  DATE OF BIRTH:  31-May-1941  SUBJECTIVE:   No complaints. Feels weak.  REVIEW OF SYSTEMS:   Review of Systems  Constitutional: Positive for malaise/fatigue. Negative for fever and chills.  HENT: Negative for sore throat.   Eyes: Negative for blurred vision, double vision and pain.  Respiratory: Negative for cough, hemoptysis, shortness of breath and wheezing.   Cardiovascular: Negative for chest pain, palpitations, orthopnea and leg swelling.  Gastrointestinal: Negative for heartburn, nausea, vomiting, abdominal pain, diarrhea and constipation.  Genitourinary: Negative for dysuria and hematuria.  Musculoskeletal: Positive for back pain and joint pain.  Skin: Negative for rash.  Neurological: Positive for weakness. Negative for sensory change, speech change, focal weakness and headaches.  Endo/Heme/Allergies: Does not bruise/bleed easily.  Psychiatric/Behavioral: Positive for depression. The patient is not nervous/anxious.     DRUG ALLERGIES:   Allergies  Allergen Reactions  . Morphine And Related Other (See Comments)    Reaction:  Hallucinations   . Oxycodone Other (See Comments)    Reaction:  Unknown   . Sulfa Antibiotics Itching  . Tazobactam Other (See Comments)    Reaction:  Unknown   . Adhesive [Tape] Rash  . Isosorbide Rash    VITALS:  Blood pressure 110/50, pulse 79, temperature 97.8 F (36.6 C), temperature source Oral, resp. rate 16, height 5\' 6"  (1.676 m), weight 57.698 kg (127 lb 3.2 oz), SpO2 100 %.  PHYSICAL EXAMINATION:   Physical Exam  Constitutional: She is oriented to person, place, and time and well-developed, well-nourished, and in no distress.  HENT:  Head: Normocephalic and atraumatic.  Neck: Normal range of motion. No thyromegaly present.  Cardiovascular: Normal rate and regular rhythm.   Pulmonary/Chest: Effort normal and breath  sounds normal. No respiratory distress.  Abdominal: Soft. Bowel sounds are normal. She exhibits no distension. There is no tenderness.  Musculoskeletal: She exhibits no edema.  Wound vac in place over right hip  Neurological: She is alert and oriented to person, place, and time.  Psychiatric:  depressed   LABORATORY PANEL:   CBC  Recent Labs Lab 05/28/15 0340  WBC 5.3  HGB 6.6*  HCT 20.1*  PLT 268   ------------------------------------------------------------------------------------------------------------------  Chemistries   Recent Labs Lab 05/27/15 1505 05/28/15 0340  NA 138 139  K 4.1 4.1  CL 104 110  CO2 25 22  GLUCOSE 82 81  BUN 26* 20  CREATININE 0.94 0.59  CALCIUM 8.1* 7.8*  AST 30  --   ALT <5*  --   ALKPHOS 77  --   BILITOT 0.9  --    ------------------------------------------------------------------------------------------------------------------  RADIOLOGY:  No results found.   ASSESSMENT AND PLAN:   * Acute blood loss anemia Due to blood loss from hip wound. Transfuse and monitor. Had extensive w/u for GI bleed which has been negative in the past. Iron replacement.  * recent hip sx with hematoma Has wound vac. Still slowly losing blood. Seen by ortho.  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  - Continue clindamycin and rifampin. Patient follows with Dr. Ola Spurr as an outpatient. - cultures NTD from this admission  * Parkinson's disease-follows with Dr. Manuella Ghazi. Continue home medications.  * depression  *Urinary retention Check ua. Straight cath. Foley if further retention  CODE STATUS: Full  TOTAL TIME TAKING CARE OF THIS PATIENT: 40 minutes.   POSSIBLE D/C tomorrow.  Hillary Bow R M.D on 05/28/2015   Between 7am to 6pm - Pager - [phone]  After 6pm go to [email or web address] - password [email or web address] [facility]  Office  [phone]  CC: Primary care physician;  Idelle Crouch, MD

## 2015-05-28 NOTE — Progress Notes (Signed)
Pt seen and examined. Has been seen by orthopedics. Anemia due to blood loss from her wound.  Will transfuse. If Hb > 8 can d/c back to rehab.

## 2015-05-28 NOTE — Progress Notes (Signed)
Patient unable to void since this am , bladder scan done and 453mls resulted, md was made aware and order in and out cath and send urine for UA/ CULTURE

## 2015-05-28 NOTE — Progress Notes (Signed)
Patient is status post I&D of a extensive hematoma. She was seen in the office last Tuesday secondary to problems with the wound VAC over a long weekend. It is been doing well since then. She was admitted with anemia. On exam the wound VAC appears to be functioning no is no evidence of infection clinically. We will continue to follow her here in the hospital and change wound VAC as needed

## 2015-05-28 NOTE — Discharge Instructions (Addendum)
°  DIET:  Cardiac diet  DISCHARGE CONDITION:  Stable  ACTIVITY:  Activity as tolerated  OXYGEN:  Home Oxygen: No.   Oxygen Delivery: room air  DISCHARGE LOCATION:  nursing home   If you experience worsening of your admission symptoms, develop shortness of breath, life threatening emergency, suicidal or homicidal thoughts you must seek medical attention immediately by calling 911 or calling your MD immediately  if symptoms less severe.  You Must read complete instructions/literature along with all the possible adverse reactions/side effects for all the Medicines you take and that have been prescribed to you. Take any new Medicines after you have completely understood and accpet all the possible adverse reactions/side effects.   Please note  You were cared for by a hospitalist during your hospital stay. If you have any questions about your discharge medications or the care you received while you were in the hospital after you are discharged, you can call the unit and asked to speak with the hospitalist on call if the hospitalist that took care of you is not available. Once you are discharged, your primary care physician will handle any further medical issues. Please note that NO REFILLS for any discharge medications will be authorized once you are discharged, as it is imperative that you return to your primary care physician (or establish a relationship with a primary care physician if you do not have one) for your aftercare needs so that they can reassess your need for medications and monitor your lab values.  NEEDS PSYCHIATRY FOLLOW UP IN A WEEK

## 2015-05-29 LAB — HEMOGLOBIN: Hemoglobin: 9.5 g/dL — ABNORMAL LOW (ref 12.0–16.0)

## 2015-05-29 NOTE — Progress Notes (Signed)
Patient is discharge in a statble with hgb 9.6 today after 3 unit of prbc given, report given to floor nurse Chrissie, pt family aware of discharge to SNF, left by EMS

## 2015-05-29 NOTE — Clinical Social Work Note (Signed)
CSW spoke to family and notified them that pt would return to [facility] to finish her rehab.  Pt's family is in agreement with this they also spoke to MD about pt's DC plan.  CSW notified facility that pt would DC today via EMS.  CSW signing off.  Person of contact was Daughter In Waymon [phone]

## 2015-05-30 LAB — TYPE AND SCREEN
ABO/RH(D): O POS
Antibody Screen: NEGATIVE
UNIT DIVISION: 0
UNIT DIVISION: 0
UNIT DIVISION: 0
Unit division: 0

## 2015-05-30 LAB — URINE CULTURE: Special Requests: NORMAL

## 2015-06-06 ENCOUNTER — Ambulatory Visit: Payer: Medicare Other | Admitting: Hematology and Oncology

## 2015-06-13 ENCOUNTER — Inpatient Hospital Stay: Payer: Medicare Other | Attending: Hematology and Oncology | Admitting: Hematology and Oncology

## 2015-06-13 ENCOUNTER — Inpatient Hospital Stay: Payer: Medicare Other

## 2015-06-13 ENCOUNTER — Encounter: Payer: Self-pay | Admitting: Hematology and Oncology

## 2015-06-13 VITALS — BP 70/42 | HR 84 | Temp 99.3°F | Resp 16 | Wt 122.0 lb

## 2015-06-13 DIAGNOSIS — T8189XS Other complications of procedures, not elsewhere classified, sequela: Secondary | ICD-10-CM | POA: Diagnosis not present

## 2015-06-13 DIAGNOSIS — I10 Essential (primary) hypertension: Secondary | ICD-10-CM | POA: Diagnosis not present

## 2015-06-13 DIAGNOSIS — F329 Major depressive disorder, single episode, unspecified: Secondary | ICD-10-CM | POA: Insufficient documentation

## 2015-06-13 DIAGNOSIS — T8483XA Hemorrhage due to internal orthopedic prosthetic devices, implants and grafts, initial encounter: Secondary | ICD-10-CM | POA: Diagnosis not present

## 2015-06-13 DIAGNOSIS — E785 Hyperlipidemia, unspecified: Secondary | ICD-10-CM | POA: Diagnosis not present

## 2015-06-13 DIAGNOSIS — Y831 Surgical operation with implant of artificial internal device as the cause of abnormal reaction of the patient, or of later complication, without mention of misadventure at the time of the procedure: Secondary | ICD-10-CM | POA: Insufficient documentation

## 2015-06-13 DIAGNOSIS — Z87891 Personal history of nicotine dependence: Secondary | ICD-10-CM | POA: Insufficient documentation

## 2015-06-13 DIAGNOSIS — G20 Parkinson's disease: Secondary | ICD-10-CM | POA: Diagnosis not present

## 2015-06-13 DIAGNOSIS — Z853 Personal history of malignant neoplasm of breast: Secondary | ICD-10-CM | POA: Diagnosis not present

## 2015-06-13 DIAGNOSIS — K219 Gastro-esophageal reflux disease without esophagitis: Secondary | ICD-10-CM | POA: Diagnosis not present

## 2015-06-13 DIAGNOSIS — I2510 Atherosclerotic heart disease of native coronary artery without angina pectoris: Secondary | ICD-10-CM | POA: Insufficient documentation

## 2015-06-13 DIAGNOSIS — D649 Anemia, unspecified: Secondary | ICD-10-CM

## 2015-06-13 DIAGNOSIS — F32A Depression, unspecified: Secondary | ICD-10-CM | POA: Insufficient documentation

## 2015-06-13 DIAGNOSIS — Z8614 Personal history of Methicillin resistant Staphylococcus aureus infection: Secondary | ICD-10-CM | POA: Diagnosis not present

## 2015-06-13 DIAGNOSIS — Z9889 Other specified postprocedural states: Secondary | ICD-10-CM | POA: Diagnosis not present

## 2015-06-13 DIAGNOSIS — Z96643 Presence of artificial hip joint, bilateral: Secondary | ICD-10-CM

## 2015-06-13 DIAGNOSIS — Z79899 Other long term (current) drug therapy: Secondary | ICD-10-CM | POA: Diagnosis not present

## 2015-06-13 DIAGNOSIS — M1990 Unspecified osteoarthritis, unspecified site: Secondary | ICD-10-CM | POA: Insufficient documentation

## 2015-06-13 DIAGNOSIS — N63 Unspecified lump in unspecified breast: Secondary | ICD-10-CM | POA: Insufficient documentation

## 2015-06-13 DIAGNOSIS — K649 Unspecified hemorrhoids: Secondary | ICD-10-CM | POA: Insufficient documentation

## 2015-06-13 DIAGNOSIS — G620 Drug-induced polyneuropathy: Secondary | ICD-10-CM

## 2015-06-13 LAB — IRON AND TIBC
Iron: 38 ug/dL (ref 28–170)
Saturation Ratios: 27 % (ref 10.4–31.8)
TIBC: 140 ug/dL — ABNORMAL LOW (ref 250–450)
UIBC: 102 ug/dL

## 2015-06-13 LAB — CBC WITH DIFFERENTIAL/PLATELET
Basophils Absolute: 0.1 10*3/uL (ref 0–0.1)
Basophils Relative: 1 %
Eosinophils Absolute: 0.1 10*3/uL (ref 0–0.7)
Eosinophils Relative: 2 %
HCT: 26.5 % — ABNORMAL LOW (ref 35.0–47.0)
Hemoglobin: 8.5 g/dL — ABNORMAL LOW (ref 12.0–16.0)
Lymphocytes Relative: 15 %
Lymphs Abs: 1 10*3/uL (ref 1.0–3.6)
MCH: 28.5 pg (ref 26.0–34.0)
MCHC: 32.2 g/dL (ref 32.0–36.0)
MCV: 88.6 fL (ref 80.0–100.0)
Monocytes Absolute: 1.1 10*3/uL — ABNORMAL HIGH (ref 0.2–0.9)
Monocytes Relative: 18 %
Neutro Abs: 4 10*3/uL (ref 1.4–6.5)
Neutrophils Relative %: 64 %
Platelets: 605 10*3/uL — ABNORMAL HIGH (ref 150–440)
RBC: 2.99 MIL/uL — ABNORMAL LOW (ref 3.80–5.20)
RDW: 20.2 % — ABNORMAL HIGH (ref 11.5–14.5)
WBC: 6.2 10*3/uL (ref 3.6–11.0)

## 2015-06-13 LAB — FERRITIN: Ferritin: 781 ng/mL — ABNORMAL HIGH (ref 11–307)

## 2015-06-13 LAB — DAT, POLYSPECIFIC AHG (ARMC ONLY): Polyspecific AHG test: NEGATIVE

## 2015-06-13 LAB — VITAMIN B12: Vitamin B-12: 1416 pg/mL — ABNORMAL HIGH (ref 180–914)

## 2015-06-13 LAB — TSH: TSH: 2.107 u[IU]/mL (ref 0.350–4.500)

## 2015-06-13 LAB — TYPE AND SCREEN
ABO/RH(D): O POS
Antibody Screen: NEGATIVE

## 2015-06-13 LAB — RETICULOCYTES
RBC.: 2.93 MIL/uL — ABNORMAL LOW (ref 3.80–5.20)
Retic Count, Absolute: 67.4 10*3/uL (ref 19.0–183.0)
Retic Ct Pct: 2.3 % (ref 0.4–3.1)

## 2015-06-13 LAB — FOLATE: Folate: 3.5 ng/mL — ABNORMAL LOW (ref >5.9)

## 2015-06-13 NOTE — Progress Notes (Signed)
[facility] day:  06/13/2015  Chief Complaint: Sabrina Duncan is a 74 y.o. female with anemia who is referred after recent hospitalization.  HPI:  The patient has been a resident of the [facility] since surgery at the end of 03/2015. She notes that her blood has "dropped and dropped".  She had hip surgery then placement of a wound vac.   She states the first time she stepped on the tubing, it broke open.  Patient states that her wound is not improving. She's been back to the hospital 2 times. She has received 8 units of packed red blood.   She last received  blood 2 weeks ago.  She states that she has not felt any better after the blood.  She states that she had both hips replaced 10 years ago.  Approximately 5 years later, both were recalled.  She noted issues with "cobalt".  She underwent left hip replacement 3 years ago. She then had replacement of the right hip on 03/26/2015.  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  She was admitted recently with acute on chronic anemia due to hematoma from her prosthetic site.  Dr. Rudene Christians placed a wound VAC on 05/11/2015.  She was admitted to [facility] on 05/27/2015 - 05/29/2015.  Outside labs had revealed a low hemoglobin.  She was sent to the emergency room.  Hemoglobin was 6.4.  Her blood loss was due to chronic loss in the wound VAC as well as an acute loss at the nursing home when her wound VAC was discharged.   She was apparently found in a pool of blood.  She was transfused with 3 units of PRBCs and was started on iron supplementation.  She was referred to the [facility] for future blood transfusions.  Labs on 06/12/2015 revealed a hematocrit of 27, hemoglobin 8.4, platelets 576,000, WBC 4200 with a normal differential.  Creatinine was 0.51.Aljaline phosphatase was 112.  Patient states that her  diet is less than ideal. She states nothing tastes good.  She has dry heaves after eating.  She will eat oatmeal (pureed), fish, green beans, rice, and chicken.  She does not eat more than a few bites. She tries to drink Boost 3 times a day.   The patient denies any other bleeding.  She states that she had a colonoscopy about 5 years ago in [place].  Past Medical History  Diagnosis Date  . Depressive disorder, not elsewhere classified   . Unspecified osteomyelitis, site unspecified   . Polymyalgia rheumatica   . Paralysis agitans   . Osteoarthrosis, unspecified whether generalized or localized, unspecified site   . Muscle weakness (generalized)   . Difficulty in walking(719.7)   . Parkinson disease   . PMR (polymyalgia rheumatica)   . Coronary atherosclerosis of unspecified type of vessel, native or graft     2007: LAD PCI with a BMS, 80% distal LCX stenosis treated medically. Most recent stress test in 04/2012 showed distal anterio/apical scar? with no ischemia, EF 53%.   [name] Unspecified essential hypertension   . Other and unspecified hyperlipidemia   . Thoracic ascending aortic aneurysm     4.0 cm  . Complication of anesthesia     2013- hallucinations- [facility]. (-spinal anesth. 2013- no problems   redo- hip- L)  . Esophageal reflux     pt.  reports that its resolved   . Neuromuscular disorder     parkinson's - [facility] - neurologist   . Cancer     left breast cancer- lumpectomy, chemo, radiation, tamoxifen , SLNBx  . Anemia, unspecified     bld. transfusion- 02/2013    Past Surgical History  Procedure Laterality Date  . Knee surgery Left   . Varicose vein surgery      both legs   . Melanoma excision Right     elbow  . Knee surgery Left   . Hip replacement Bilateral   . Foot surgery Right 2013  . Rotator cuff repair Right   . Cardiac catheterization  2007    [place], [place]; x1 stent  . Cardiac catheterization  9/14    [facility]  . Breast lumpectomy  Left 1997  . Joint replacement      L-hipx5, R hipx2, Lkneex1,   . Tonsillectomy    . Cholecystectomy  2005    open repair - for gangernous   . Peripherally inserted central catheter insertion      for IV antibiotics related to repeated infection, post hip replacement  . Reverse shoulder arthroplasty Right 09/29/2013    Procedure: REVERSE SHOULDER ARTHROPLASTY;  Surgeon: Nita Sells, MD;  Location: [place];  Service: Orthopedics;  Laterality: Right;  Right reverse total shoulder  . Hematoma evacuation Right 05/11/2015    Procedure: EVACUATION HEMATOMA/RIGHT HIP HEMATOMA DRAINAGE WITH WOUND VAC;  Surgeon: Hessie Knows, MD;  Location: [facility];  Service: Orthopedics;  Laterality: Right;    Family History  Problem Relation Age of Onset  . Hypertension Mother     Social History:  reports that she quit smoking about 30 years ago. Her smoking use included Cigarettes. She has a 5 pack-year smoking history. She does not have any smokeless tobacco history on file. She reports that she drinks alcohol. She reports that she does not use illicit drugs.  Before surgery, she lived in assisted living at [facility].  She is a resident at [facility].  She needs assistance with dressing and using the restroom.  The patient is accompanied by her son, [name], and an aide from [facility] today.  Allergies:  Allergies  Allergen Reactions  . Morphine And Related Other (See Comments)    Reaction:  Hallucinations   . Oxycodone Other (See Comments)    Reaction:  Unknown   . Sulfa Antibiotics Itching  . Tazobactam Other (See Comments)    Reaction:  Unknown   . Adhesive [Tape] Rash  . Isosorbide Rash    Current Medications: Current Outpatient Prescriptions  Medication Sig Dispense Refill  . acetaminophen (TYLENOL) 325 MG tablet Take 2 tablets (650 mg total) by mouth every 6 (six) hours as needed for mild pain (or Fever >/= 101). 30 tablet 0  . Amino Acids-Protein Hydrolys (FEEDING  SUPPLEMENT, PRO-STAT SUGAR FREE 64,) LIQD Take 30 mLs by mouth 2 (two) times daily between meals.    . ARIPiprazole (ABILIFY) 5 MG tablet Take 1 tablet (5 mg total) by mouth daily. 30 tablet 0  . Cholecalciferol (VITAMIN D3) 1000 UNITS CAPS Take by mouth.    . clindamycin (CLEOCIN) 300 MG capsule Take 1 capsule (300 mg total) by mouth 2 (two) times daily. 60 capsule 3  . docusate sodium 100 MG CAPS Take 100 mg by mouth 2 (two) times daily. 30 capsule 0  . Ferrous Fumarate 324 (106 FE) MG TABS Take 1 tablet by mouth 3 (three) times daily.     [name]  LORazepam (ATIVAN) 0.5 MG tablet Take 0.5 mg by mouth every 8 (eight) hours as needed for anxiety.    . metoprolol (LOPRESSOR) 50 MG tablet Take 50 mg by mouth 2 (two) times daily.     . polyethylene glycol powder (GLYCOLAX/MIRALAX) powder Take 17 g by mouth daily as needed for mild constipation or moderate constipation.    . QUEtiapine (SEROQUEL) 25 MG tablet Take 25 mg by mouth at bedtime.    [name] QUEtiapine (SEROQUEL) 50 MG tablet Take by mouth.    . rifampin (RIFADIN) 300 MG capsule Take 300 mg by mouth 2 (two) times daily.    [name] rOPINIRole (REQUIP) 2 MG tablet Take 2 mg by mouth 3 (three) times daily.    [name] senna (SENOKOT) 8.6 MG TABS tablet Take 1 tablet (8.6 mg total) by mouth daily as needed for mild constipation. 120 each 0  . traMADol (ULTRAM) 50 MG tablet Take 1 tablet (50 mg total) by mouth every 6 (six) hours as needed for moderate pain. 30 tablet 0  . venlafaxine XR (EFFEXOR-XR) 150 MG 24 hr capsule Take 150 mg by mouth 2 (two) times daily.     . vitamin B-12 (CYANOCOBALAMIN) 1000 MCG tablet Take 1,000 mcg by mouth daily.     [name] atorvastatin (LIPITOR) 20 MG tablet Take 20 mg by mouth at bedtime.     [name] buPROPion (WELLBUTRIN) 100 MG tablet Take 100 mg by mouth daily.    . carbidopa-levodopa (SINEMET CR) 50-200 MG per tablet Take 2 tablets by mouth 4 (four) times daily.    . carbidopa-levodopa-entacapone (STALEVO) 50-200-200 MG per tablet Take 1 tablet  by mouth at bedtime.     . ferrous fumarate (HEMOCYTE - 106 MG FE) 325 (106 FE) MG TABS tablet Take 1 tablet (106 mg of iron total) by mouth 2 (two) times daily. (Patient not taking: Reported on 06/13/2015) 30 each 0   No current facility-administered medications for this visit.    Review of Systems:  GENERAL:  Feels terrible all the time (past 6 months).  No fevers or sweats.  Weight loss of 22 pounds in 6 months. PERFORMANCE STATUS (ECOG): 3 HEENT:  No visual changes, runny nose, sore throat, mouth sores or tenderness. Lungs: No shortness of breath or cough.  No hemoptysis. Cardiac:  No chest pain, palpitations, orthopnea, or PND. GI:  No nausea, vomiting, diarrhea, constipation, melena or hematochezia. GU:  No urgency, frequency, dysuria, or hematuria. Musculoskeletal:  No back pain.  No joint pain.  No muscle tenderness. Extremities:  No pain or swelling. Skin:  Pressure ulcer on bottom.  No rashes or skin changes. Neuro:  No headache, numbness or weakness, balance or coordination issues. Endocrine:  No diabetes, thyroid issues, hot flashes or night sweats. Psych:  No mood changes, depression or anxiety. Pain:  No focal pain. Review of systems:  All other systems reviewed and found to be negative.   Physical Exam: Blood pressure 70/42, pulse 84, temperature 99.3 F (37.4 C), temperature source Tympanic, resp. rate 16, weight 122 lb (55.339 kg), SpO2 100 %. GENERAL:  Thin woman sitting comfortably in a wheelchair in the exam room in no acute distress. MENTAL STATUS:  Alert and oriented to person, place and time. HEAD:  brown hair.  Normocephalic, atraumatic, face symmetric, no Cushingoid features. EYES:  Glasses.  Blue eyes.  Pupils equal round and reactive to light and accomodation.  No conjunctivitis or scleral icterus. ENT:  Oropharynx clear without lesion.  Tongue normal. Mucous membranes  moist.  RESPIRATORY:  Clear to auscultation without rales, wheezes or  rhonchi. CARDIOVASCULAR:  Regular rate and rhythm without murmur, rub or gallop. ABDOMEN:  Soft, non-tender, with active bowel sounds, and no hepatosplenomegaly.  No masses. SKIN:  Upper extremity bruising.  Decubitus ulcer not visualized.  No rashes, ulcers or lesions. EXTREMITIES: No edema, no skin discoloration or tenderness.  No palpable cords. LYMPH NODES: No palpable cervical, supraclavicular, axillary or inguinal adenopathy  NEUROLOGICAL: Unremarkable. PSYCH:  Appropriate.   No visits with results within 3 Day(s) from this visit. Latest known visit with results is:  Admission on 05/27/2015, Discharged on 05/29/2015  Component Date Value Ref Range Status  . Sodium 05/27/2015 138  135 - 145 mmol/L Final  . Potassium 05/27/2015 4.1  3.5 - 5.1 mmol/L Final  . Chloride 05/27/2015 104  101 - 111 mmol/L Final  . CO2 05/27/2015 25  22 - 32 mmol/L Final  . Glucose, Bld 05/27/2015 82  65 - 99 mg/dL Final  . BUN 05/27/2015 26* 6 - 20 mg/dL Final  . Creatinine, Ser 05/27/2015 0.94  0.44 - 1.00 mg/dL Final  . Calcium 05/27/2015 8.1* 8.9 - 10.3 mg/dL Final  . Total Protein 05/27/2015 5.7* 6.5 - 8.1 g/dL Final  . Albumin 05/27/2015 2.6* 3.5 - 5.0 g/dL Final  . AST 05/27/2015 30  15 - 41 U/L Final  . ALT 05/27/2015 <5* 14 - 54 U/L Final  . Alkaline Phosphatase 05/27/2015 77  38 - 126 U/L Final  . Total Bilirubin 05/27/2015 0.9  0.3 - 1.2 mg/dL Final  . GFR calc non Af Amer 05/27/2015 60* >60 mL/min Final  . GFR calc Af Amer 05/27/2015 >60  >60 mL/min Final   Comment: (NOTE) The eGFR has been calculated using the CKD EPI equation. This calculation has not been validated in all clinical situations. eGFR's persistently <60 mL/min signify possible Chronic Kidney Disease.   . Anion gap 05/27/2015 9  5 - 15 Final  . WBC 05/27/2015 4.1  3.6 - 11.0 K/uL Final  . RBC 05/27/2015 2.16* 3.80 - 5.20 MIL/uL Final  . Hemoglobin 05/27/2015 6.2* 12.0 - 16.0 g/dL Final  . HCT 05/27/2015 19.3* 35.0 -  47.0 % Final  . MCV 05/27/2015 89.6  80.0 - 100.0 fL Final  . MCH 05/27/2015 29.0  26.0 - 34.0 pg Final  . MCHC 05/27/2015 32.3  32.0 - 36.0 g/dL Final  . RDW 05/27/2015 21.8* 11.5 - 14.5 % Final  . Platelets 05/27/2015 315  150 - 440 K/uL Final  . Neutrophils Relative % 05/27/2015 70   Final  . Neutro Abs 05/27/2015 2.9  1.4 - 6.5 K/uL Final  . Lymphocytes Relative 05/27/2015 17   Final  . Lymphs Abs 05/27/2015 0.7* 1.0 - 3.6 K/uL Final  . Monocytes Relative 05/27/2015 10   Final  . Monocytes Absolute 05/27/2015 0.4  0.2 - 0.9 K/uL Final  . Eosinophils Relative 05/27/2015 2   Final  . Eosinophils Absolute 05/27/2015 0.1  0 - 0.7 K/uL Final  . Basophils Relative 05/27/2015 1   Final  . Basophils Absolute 05/27/2015 0.0  0 - 0.1 K/uL Final  . ABO/RH(D) 05/27/2015 O POS   Final  . Antibody Screen 05/27/2015 NEG   Final  . Sample Expiration 05/27/2015 05/30/2015   Final  . Unit Number 05/27/2015 [id number]   Final  . Blood Component Type 05/27/2015 RBC LR PHER1   Final  . Unit division 05/27/2015 00   Final  . Status of Unit  05/27/2015 REL FROM [facility]   Final  . Transfusion Status 05/27/2015 OK TO TRANSFUSE   Final  . Crossmatch Result 05/27/2015 Compatible   Final  . Unit Number 05/27/2015 [id number]   Final  . Blood Component Type 05/27/2015 RBC LR PHER1   Final  . Unit division 05/27/2015 00   Final  . Status of Unit 05/27/2015 ISSUED,FINAL   Final  . Transfusion Status 05/27/2015 OK TO TRANSFUSE   Final  . Crossmatch Result 05/27/2015 Compatible   Final  . Unit Number 05/27/2015 [id number]   Final  . Blood Component Type 05/27/2015 RED CELLS,LR   Final  . Unit division 05/27/2015 00   Final  . Status of Unit 05/27/2015 ISSUED,FINAL   Final  . Transfusion Status 05/27/2015 OK TO TRANSFUSE   Final  . Crossmatch Result 05/27/2015 Compatible   Final  . Unit Number 05/27/2015 [id number]   Final  . Blood Component Type 05/27/2015 RBC LR PHER2   Final  . Unit division  05/27/2015 00   Final  . Status of Unit 05/27/2015 ISSUED,FINAL   Final  . Transfusion Status 05/27/2015 OK TO TRANSFUSE   Final  . Crossmatch Result 05/27/2015 Compatible   Final  . Order Confirmation 05/27/2015 ORDER PROCESSED BY BLOOD BANK   Final  . Troponin I 05/27/2015 0.03  <0.031 ng/mL Final   Comment:        NO INDICATION OF MYOCARDIAL INJURY.   . ABO/RH(D) 05/27/2015 O POS   Final  . Sodium 05/28/2015 139  135 - 145 mmol/L Final  . Potassium 05/28/2015 4.1  3.5 - 5.1 mmol/L Final  . Chloride 05/28/2015 110  101 - 111 mmol/L Final  . CO2 05/28/2015 22  22 - 32 mmol/L Final  . Glucose, Bld 05/28/2015 81  65 - 99 mg/dL Final  . BUN 05/28/2015 20  6 - 20 mg/dL Final  . Creatinine, Ser 05/28/2015 0.59  0.44 - 1.00 mg/dL Final  . Calcium 05/28/2015 7.8* 8.9 - 10.3 mg/dL Final  . GFR calc non Af Amer 05/28/2015 >60  >60 mL/min Final  . GFR calc Af Amer 05/28/2015 >60  >60 mL/min Final   Comment: (NOTE) The eGFR has been calculated using the CKD EPI equation. This calculation has not been validated in all clinical situations. eGFR's persistently <60 mL/min signify possible Chronic Kidney Disease.   . Anion gap 05/28/2015 7  5 - 15 Final  . WBC 05/28/2015 5.3  3.6 - 11.0 K/uL Final  . RBC 05/28/2015 2.33* 3.80 - 5.20 MIL/uL Final  . Hemoglobin 05/28/2015 6.6* 12.0 - 16.0 g/dL Final  . HCT 05/28/2015 20.1* 35.0 - 47.0 % Final  . MCV 05/28/2015 86.4  80.0 - 100.0 fL Final  . MCH 05/28/2015 28.1  26.0 - 34.0 pg Final  . MCHC 05/28/2015 32.5  32.0 - 36.0 g/dL Final  . RDW 05/28/2015 21.5* 11.5 - 14.5 % Final  . Platelets 05/28/2015 268  150 - 440 K/uL Final  . Prothrombin Time 05/28/2015 19.4* 11.4 - 15.0 seconds Final  . INR 05/28/2015 1.62   Final  . aPTT 05/28/2015 42* 24 - 36 seconds Final   Comment:        IF BASELINE aPTT IS ELEVATED, SUGGEST PATIENT RISK ASSESSMENT BE USED TO DETERMINE APPROPRIATE ANTICOAGULANT THERAPY.   . Fibrinogen 05/28/2015 605* 210 - 470 mg/dL  Final  . Order Confirmation 05/28/2015 ORDER PROCESSED BY BLOOD BANK   Final  . Color, Urine 05/28/2015 AMBER* YELLOW Final  . APPearance  05/28/2015 CLEAR* CLEAR Final  . Glucose, UA 05/28/2015 NEGATIVE  NEGATIVE mg/dL Final  . Bilirubin Urine 05/28/2015 NEGATIVE  NEGATIVE Final  . Ketones, ur 05/28/2015 1+* NEGATIVE mg/dL Final  . Specific Gravity, Urine 05/28/2015 1.020  1.005 - 1.030 Final  . Hgb urine dipstick 05/28/2015 NEGATIVE  NEGATIVE Final  . pH 05/28/2015 5.0  5.0 - 8.0 Final  . Protein, ur 05/28/2015 NEGATIVE  NEGATIVE mg/dL Final  . Nitrite 05/28/2015 NEGATIVE  NEGATIVE Final  . Leukocytes, UA 05/28/2015 NEGATIVE  NEGATIVE Final  . RBC / HPF 05/28/2015 0-5  0 - 5 RBC/hpf Final  . WBC, UA 05/28/2015 0-5  0 - 5 WBC/hpf Final  . Bacteria, UA 05/28/2015 NONE SEEN  NONE SEEN Final  . Squamous Epithelial / LPF 05/28/2015 NONE SEEN  NONE SEEN Final  . Specimen Description 05/28/2015 URINE, RANDOM   Final  . Special Requests 05/28/2015 Normal   Final  . Culture 05/28/2015    Final                   Value:9,000 COLONIES/mL INSIGNIFICANT GROWTH   . Report Status 05/28/2015 05/30/2015 FINAL   Final  . Hemoglobin 05/29/2015 9.5* 12.0 - 16.0 g/dL Final    Assessment:  Sabrina Duncan is a 74 y.o. female with prosthetic hip and subsequent hematoma and drainage.  She has had recurrent anemia secondary to chronic blood loss.  Per her report, she has received 8 units of PRBCs.  She continues to have drainage.  She denies any other bleeding.  EGD and colonoscopy 5 years ago was normal.  Performance status is poor.  Diet is poor.    Plan: 1. Review complicated medical history, recurrent bleeding from prosthetic site.  Discuss transfusion as needed.  Assess potential other causes of anemia. 2. Labs ordered:  CBC with diff, retic, ferritin, iron studies, B12, folate, TSH, [place]. type and screen. 3. RTC in 1 week for MD assessment, review of labs, and +/- transfusion.     Lequita Asal, MD  06/13/2015, 9:15 AM

## 2015-06-17 ENCOUNTER — Inpatient Hospital Stay
Admission: EM | Admit: 2015-06-17 | Discharge: 2015-06-24 | DRG: 982 | Disposition: A | Discharge status: Skilled Nursing Facility | Payer: Medicare Other | Attending: Internal Medicine | Admitting: Internal Medicine

## 2015-06-17 DIAGNOSIS — I10 Essential (primary) hypertension: Secondary | ICD-10-CM | POA: Diagnosis present

## 2015-06-17 DIAGNOSIS — Z85820 Personal history of malignant melanoma of skin: Secondary | ICD-10-CM

## 2015-06-17 DIAGNOSIS — Z79899 Other long term (current) drug therapy: Secondary | ICD-10-CM | POA: Diagnosis not present

## 2015-06-17 DIAGNOSIS — I2510 Atherosclerotic heart disease of native coronary artery without angina pectoris: Secondary | ICD-10-CM | POA: Diagnosis present

## 2015-06-17 DIAGNOSIS — M1990 Unspecified osteoarthritis, unspecified site: Secondary | ICD-10-CM | POA: Diagnosis present

## 2015-06-17 DIAGNOSIS — E871 Hypo-osmolality and hyponatremia: Secondary | ICD-10-CM | POA: Diagnosis present

## 2015-06-17 DIAGNOSIS — Z882 Allergy status to sulfonamides status: Secondary | ICD-10-CM

## 2015-06-17 DIAGNOSIS — Z853 Personal history of malignant neoplasm of breast: Secondary | ICD-10-CM | POA: Diagnosis not present

## 2015-06-17 DIAGNOSIS — L89150 Pressure ulcer of sacral region, unstageable: Secondary | ICD-10-CM | POA: Diagnosis present

## 2015-06-17 DIAGNOSIS — N390 Urinary tract infection, site not specified: Secondary | ICD-10-CM | POA: Diagnosis present

## 2015-06-17 DIAGNOSIS — Z923 Personal history of irradiation: Secondary | ICD-10-CM

## 2015-06-17 DIAGNOSIS — Z681 Body mass index (BMI) 19 or less, adult: Secondary | ICD-10-CM | POA: Diagnosis not present

## 2015-06-17 DIAGNOSIS — T8450XA Infection and inflammatory reaction due to unspecified internal joint prosthesis, initial encounter: Secondary | ICD-10-CM | POA: Diagnosis present

## 2015-06-17 DIAGNOSIS — S7001XA Contusion of right hip, initial encounter: Secondary | ICD-10-CM | POA: Diagnosis present

## 2015-06-17 DIAGNOSIS — Y831 Surgical operation with implant of artificial internal device as the cause of abnormal reaction of the patient, or of later complication, without mention of misadventure at the time of the procedure: Secondary | ICD-10-CM | POA: Diagnosis present

## 2015-06-17 DIAGNOSIS — Z9889 Other specified postprocedural states: Secondary | ICD-10-CM | POA: Diagnosis not present

## 2015-06-17 DIAGNOSIS — G8918 Other acute postprocedural pain: Secondary | ICD-10-CM

## 2015-06-17 DIAGNOSIS — Z9221 Personal history of antineoplastic chemotherapy: Secondary | ICD-10-CM | POA: Diagnosis not present

## 2015-06-17 DIAGNOSIS — G20 Parkinson's disease: Secondary | ICD-10-CM | POA: Diagnosis present

## 2015-06-17 DIAGNOSIS — F419 Anxiety disorder, unspecified: Secondary | ICD-10-CM | POA: Diagnosis present

## 2015-06-17 DIAGNOSIS — T148XXA Other injury of unspecified body region, initial encounter: Secondary | ICD-10-CM

## 2015-06-17 DIAGNOSIS — D500 Iron deficiency anemia secondary to blood loss (chronic): Secondary | ICD-10-CM | POA: Diagnosis present

## 2015-06-17 DIAGNOSIS — E785 Hyperlipidemia, unspecified: Secondary | ICD-10-CM | POA: Diagnosis present

## 2015-06-17 DIAGNOSIS — Z96643 Presence of artificial hip joint, bilateral: Secondary | ICD-10-CM | POA: Diagnosis present

## 2015-06-17 DIAGNOSIS — G20A1 Parkinson's disease without dyskinesia, without mention of fluctuations: Secondary | ICD-10-CM

## 2015-06-17 DIAGNOSIS — Z8249 Family history of ischemic heart disease and other diseases of the circulatory system: Secondary | ICD-10-CM

## 2015-06-17 DIAGNOSIS — R410 Disorientation, unspecified: Secondary | ICD-10-CM | POA: Diagnosis present

## 2015-06-17 DIAGNOSIS — S7011XD Contusion of right thigh, subsequent encounter: Secondary | ICD-10-CM | POA: Diagnosis present

## 2015-06-17 DIAGNOSIS — Z885 Allergy status to narcotic agent status: Secondary | ICD-10-CM | POA: Diagnosis not present

## 2015-06-17 DIAGNOSIS — F329 Major depressive disorder, single episode, unspecified: Secondary | ICD-10-CM | POA: Diagnosis present

## 2015-06-17 DIAGNOSIS — D649 Anemia, unspecified: Secondary | ICD-10-CM

## 2015-06-17 DIAGNOSIS — L8990 Pressure ulcer of unspecified site, unspecified stage: Secondary | ICD-10-CM

## 2015-06-17 DIAGNOSIS — Z87891 Personal history of nicotine dependence: Secondary | ICD-10-CM | POA: Diagnosis not present

## 2015-06-17 DIAGNOSIS — D62 Acute posthemorrhagic anemia: Secondary | ICD-10-CM | POA: Diagnosis present

## 2015-06-17 DIAGNOSIS — E43 Unspecified severe protein-calorie malnutrition: Secondary | ICD-10-CM

## 2015-06-17 DIAGNOSIS — Z888 Allergy status to other drugs, medicaments and biological substances status: Secondary | ICD-10-CM

## 2015-06-17 DIAGNOSIS — M353 Polymyalgia rheumatica: Secondary | ICD-10-CM | POA: Diagnosis present

## 2015-06-17 DIAGNOSIS — Z9049 Acquired absence of other specified parts of digestive tract: Secondary | ICD-10-CM | POA: Diagnosis present

## 2015-06-17 LAB — COMPREHENSIVE METABOLIC PANEL
ALK PHOS: 107 U/L (ref 38–126)
AST: 12 U/L — ABNORMAL LOW (ref 15–41)
Albumin: 2 g/dL — ABNORMAL LOW (ref 3.5–5.0)
Anion gap: 8 (ref 5–15)
BILIRUBIN TOTAL: 0.5 mg/dL (ref 0.3–1.2)
BUN: 19 mg/dL (ref 6–20)
CO2: 26 mmol/L (ref 22–32)
Calcium: 7.8 mg/dL — ABNORMAL LOW (ref 8.9–10.3)
Chloride: 94 mmol/L — ABNORMAL LOW (ref 101–111)
Creatinine, Ser: 0.56 mg/dL (ref 0.44–1.00)
GFR calc non Af Amer: >60 mL/min (ref >60)
Glucose, Bld: 90 mg/dL (ref 65–99)
Potassium: 4.4 mmol/L (ref 3.5–5.1)
Sodium: 128 mmol/L — ABNORMAL LOW (ref 135–145)
Total Protein: 5.3 g/dL — ABNORMAL LOW (ref 6.5–8.1)

## 2015-06-17 LAB — CBC
HEMATOCRIT: 22 % — AB (ref 35.0–47.0)
HEMATOCRIT: 27 % — AB (ref 35.0–47.0)
Hemoglobin: 7.2 g/dL — ABNORMAL LOW (ref 12.0–16.0)
Hemoglobin: 9 g/dL — ABNORMAL LOW (ref 12.0–16.0)
MCH: 27.9 pg (ref 26.0–34.0)
MCH: 28.1 pg (ref 26.0–34.0)
MCHC: 32.6 g/dL (ref 32.0–36.0)
MCHC: 33.3 g/dL (ref 32.0–36.0)
MCV: 85.6 fL (ref 80.0–100.0)
MCV: 84.6 fL (ref 80.0–100.0)
Platelets: 567 10*3/uL — ABNORMAL HIGH (ref 150–440)
Platelets: 517 10*3/uL — ABNORMAL HIGH (ref 150–440)
RBC: 2.58 MIL/uL — AB (ref 3.80–5.20)
RBC: 3.2 MIL/uL — ABNORMAL LOW (ref 3.80–5.20)
RDW: 19.8 % — ABNORMAL HIGH (ref 11.5–14.5)
RDW: 18.9 % — ABNORMAL HIGH (ref 11.5–14.5)
WBC: 4.6 10*3/uL (ref 3.6–11.0)
WBC: 4.5 10*3/uL (ref 3.6–11.0)

## 2015-06-17 LAB — PREPARE RBC (CROSSMATCH)

## 2015-06-17 LAB — TSH: TSH: 1.413 u[IU]/mL (ref 0.350–4.500)

## 2015-06-17 MED ORDER — QUETIAPINE FUMARATE 50 MG PO TABS
75.0000 mg | ORAL_TABLET | Freq: Every day | ORAL | Status: DC
  Administered 2015-06-17 – 2015-06-21 (×5): 75 mg via ORAL
  Filled 2015-06-17 (×8): qty 1

## 2015-06-17 MED ORDER — ARIPIPRAZOLE 10 MG PO TABS
5.0000 mg | ORAL_TABLET | Freq: Every day | ORAL | Status: DC
  Administered 2015-06-17 – 2015-06-24 (×7): 5 mg via ORAL
  Filled 2015-06-17 (×7): qty 1

## 2015-06-17 MED ORDER — DOCUSATE SODIUM 100 MG PO CAPS
100.0000 mg | ORAL_CAPSULE | Freq: Two times a day (BID) | ORAL | Status: DC
  Administered 2015-06-17 – 2015-06-23 (×13): 100 mg via ORAL
  Filled 2015-06-17 (×13): qty 1

## 2015-06-17 MED ORDER — CARBIDOPA-LEVODOPA 25-100 MG PO TABS
2.0000 | ORAL_TABLET | Freq: Every day | ORAL | Status: DC
  Administered 2015-06-17 – 2015-06-23 (×7): 2 via ORAL
  Filled 2015-06-17 (×8): qty 2

## 2015-06-17 MED ORDER — DOCUSATE SODIUM 100 MG PO CAPS: 100.0000 mg | ORAL_CAPSULE | Freq: Two times a day (BID) | ORAL | Status: DC

## 2015-06-17 MED ORDER — VITAMIN D 1000 UNITS PO TABS
2000.0000 [IU] | ORAL_TABLET | Freq: Every day | ORAL | Status: DC
  Administered 2015-06-17 – 2015-06-24 (×7): 2000 [IU] via ORAL
  Filled 2015-06-17 (×7): qty 2

## 2015-06-17 MED ORDER — LORAZEPAM 0.5 MG PO TABS
0.5000 mg | ORAL_TABLET | Freq: Three times a day (TID) | ORAL | Status: DC | PRN
  Administered 2015-06-18 – 2015-06-20 (×3): 0.5 mg via ORAL
  Filled 2015-06-17 (×4): qty 1

## 2015-06-17 MED ORDER — SODIUM CHLORIDE 0.9 % IV BOLUS (SEPSIS)
1000.0000 mL | INTRAVENOUS | Status: DC | PRN
  Administered 2015-06-17: 1000 mL via INTRAVENOUS

## 2015-06-17 MED ORDER — ONDANSETRON HCL 4 MG PO TABS: 4.0000 mg | ORAL_TABLET | Freq: Four times a day (QID) | ORAL | Status: DC | PRN

## 2015-06-17 MED ORDER — ONDANSETRON HCL 4 MG/2ML IJ SOLN
4.0000 mg | Freq: Four times a day (QID) | INTRAMUSCULAR | Status: DC | PRN
  Administered 2015-06-19: 4 mg via INTRAVENOUS

## 2015-06-17 MED ORDER — CARBIDOPA-LEVODOPA ER 50-200 MG PO TBCR
2.0000 | EXTENDED_RELEASE_TABLET | Freq: Four times a day (QID) | ORAL | Status: DC
  Administered 2015-06-17 – 2015-06-24 (×23): 2 via ORAL
  Filled 2015-06-17 (×27): qty 2

## 2015-06-17 MED ORDER — SODIUM CHLORIDE 0.9 % IV SOLN: 10.0000 mL/h | Freq: Once | INTRAVENOUS | Status: DC

## 2015-06-17 MED ORDER — POLYETHYLENE GLYCOL 3350 17 GM/SCOOP PO POWD
17.0000 g | Freq: Every day | ORAL | Status: DC | PRN
  Filled 2015-06-17: qty 255

## 2015-06-17 MED ORDER — ACETAMINOPHEN 325 MG PO TABS: 650.0000 mg | ORAL_TABLET | Freq: Four times a day (QID) | ORAL | Status: DC | PRN

## 2015-06-17 MED ORDER — VITAMIN B-12 1000 MCG PO TABS
1000.0000 ug | ORAL_TABLET | Freq: Every day | ORAL | Status: DC
  Administered 2015-06-17 – 2015-06-24 (×7): 1000 ug via ORAL
  Filled 2015-06-17 (×7): qty 1

## 2015-06-17 MED ORDER — ACETAMINOPHEN 325 MG PO TABS
650.0000 mg | ORAL_TABLET | Freq: Four times a day (QID) | ORAL | Status: DC | PRN
  Administered 2015-06-18 – 2015-06-23 (×2): 650 mg via ORAL
  Filled 2015-06-17 (×2): qty 2

## 2015-06-17 MED ORDER — METOPROLOL TARTRATE 50 MG PO TABS
50.0000 mg | ORAL_TABLET | Freq: Two times a day (BID) | ORAL | Status: DC
  Administered 2015-06-17: 50 mg via ORAL
  Filled 2015-06-17: qty 1

## 2015-06-17 MED ORDER — ACETAMINOPHEN 650 MG RE SUPP: 650.0000 mg | Freq: Four times a day (QID) | RECTAL | Status: DC | PRN

## 2015-06-17 MED ORDER — BUPROPION HCL 100 MG PO TABS
100.0000 mg | ORAL_TABLET | Freq: Every day | ORAL | Status: DC
  Administered 2015-06-17 – 2015-06-20 (×3): 100 mg via ORAL
  Administered 2015-06-21: 10:00:00 via ORAL
  Administered 2015-06-22 – 2015-06-24 (×3): 100 mg via ORAL
  Filled 2015-06-17 (×7): qty 1

## 2015-06-17 MED ORDER — CLINDAMYCIN HCL 150 MG PO CAPS
300.0000 mg | ORAL_CAPSULE | Freq: Two times a day (BID) | ORAL | Status: DC
  Administered 2015-06-17 – 2015-06-24 (×14): 300 mg via ORAL
  Filled 2015-06-17 (×14): qty 2

## 2015-06-17 MED ORDER — FERROUS FUMARATE 325 (106 FE) MG PO TABS
1.0000 | ORAL_TABLET | Freq: Two times a day (BID) | ORAL | Status: DC
  Administered 2015-06-17: 106 mg via ORAL
  Administered 2015-06-17: 1 via ORAL
  Administered 2015-06-18 – 2015-06-20 (×5): 106 mg via ORAL
  Administered 2015-06-21: 1 via ORAL
  Administered 2015-06-21 – 2015-06-22 (×3): 106 mg via ORAL
  Administered 2015-06-23: 1 via ORAL
  Administered 2015-06-23 – 2015-06-24 (×2): 106 mg via ORAL
  Filled 2015-06-17 (×15): qty 1

## 2015-06-17 MED ORDER — ATORVASTATIN CALCIUM 20 MG PO TABS
20.0000 mg | ORAL_TABLET | Freq: Every day | ORAL | Status: DC
  Administered 2015-06-17 – 2015-06-23 (×7): 20 mg via ORAL
  Filled 2015-06-17 (×7): qty 1

## 2015-06-17 MED ORDER — QUETIAPINE FUMARATE 25 MG PO TABS
50.0000 mg | ORAL_TABLET | Freq: Every day | ORAL | Status: DC
  Administered 2015-06-17 – 2015-06-24 (×7): 50 mg via ORAL
  Filled 2015-06-17 (×12): qty 2

## 2015-06-17 MED ORDER — RIFAMPIN 300 MG PO CAPS
300.0000 mg | ORAL_CAPSULE | Freq: Two times a day (BID) | ORAL | Status: DC
  Administered 2015-06-17 – 2015-06-24 (×14): 300 mg via ORAL
  Filled 2015-06-17 (×15): qty 1

## 2015-06-17 MED ORDER — VENLAFAXINE HCL ER 75 MG PO CP24
150.0000 mg | ORAL_CAPSULE | Freq: Every day | ORAL | Status: DC
  Administered 2015-06-17 – 2015-06-24 (×7): 150 mg via ORAL
  Filled 2015-06-17 (×7): qty 2

## 2015-06-17 MED ORDER — ENTACAPONE 200 MG PO TABS
200.0000 mg | ORAL_TABLET | Freq: Every day | ORAL | Status: DC
  Administered 2015-06-17 – 2015-06-23 (×7): 200 mg via ORAL
  Filled 2015-06-17 (×8): qty 1

## 2015-06-17 MED ORDER — CARBIDOPA-LEVODOPA-ENTACAPONE 50-200-200 MG PO TABS: 1.0000 | ORAL_TABLET | Freq: Every day | ORAL | Status: DC

## 2015-06-17 MED ORDER — ROPINIROLE HCL 1 MG PO TABS
2.0000 mg | ORAL_TABLET | Freq: Three times a day (TID) | ORAL | Status: DC
  Administered 2015-06-17 – 2015-06-24 (×21): 2 mg via ORAL
  Filled 2015-06-17 (×21): qty 2

## 2015-06-17 MED ORDER — SODIUM CHLORIDE 0.9 % IV SOLN
INTRAVENOUS | Status: DC
  Administered 2015-06-17 – 2015-06-20 (×3): via INTRAVENOUS

## 2015-06-17 MED ORDER — PRO-STAT SUGAR FREE PO LIQD
30.0000 mL | Freq: Two times a day (BID) | ORAL | Status: DC
  Administered 2015-06-17 – 2015-06-22 (×5): 30 mL via ORAL

## 2015-06-17 MED ORDER — TRAMADOL HCL 50 MG PO TABS
50.0000 mg | ORAL_TABLET | Freq: Four times a day (QID) | ORAL | Status: DC | PRN
  Administered 2015-06-18 – 2015-06-20 (×4): 50 mg via ORAL
  Filled 2015-06-17 (×4): qty 1

## 2015-06-17 MED ORDER — SODIUM CHLORIDE 0.9 % IJ SOLN
3.0000 mL | Freq: Two times a day (BID) | INTRAMUSCULAR | Status: DC
  Administered 2015-06-17 – 2015-06-23 (×9): 3 mL via INTRAVENOUS

## 2015-06-17 NOTE — ED Notes (Signed)
Pt to ED via EMS from [facility] d/t abnormal labs. Pt arrived with lab results showing a hemoglobin of 6.6, RBC of 2.28, and hematocrit of 19.9. Pt arrives A&O, in NAD, with daughter at bedside.

## 2015-06-17 NOTE — ED Provider Notes (Signed)
[facility] Emergency Department Provider Note  ____________________________________________  Time seen: Approximately 2:33 AM  I have reviewed the triage vital signs and the nursing notes.   HISTORY  Chief Complaint Anemia    HPI Sabrina Duncan is a 74 y.o. female with a complicated history of a right hip replacement that was recalled and required at least 2 subsequent surgeriesto deal with a postoperative hematoma.  She is required recent hospitalization for the symptomatic anemia resulting from the hematoma.  Dr. Rudene Christians is her orthopedic surgeon and she saw Dr. Rudene Christians recently; he was concerned that if the hematoma continues to bleed he may have to go in for another operation.  The patient is at [facility] for rehabilitation and has a wound VAC in place on the right hip.  Him to the emergency department tonight because [facility] received the lab results from blood drawn recently which indicated she is once again acutely anemic.  She reports a gradual onset of worsening generalized weakness and fatigue.  She denies shortness of breath and chest pain.  She denies fever/chills.  She denies any dark or tarry stools.  She has not had much of an appetite but has also not had any nausea or vomiting.   Past Medical History  Diagnosis Date  . Depressive disorder, not elsewhere classified   . Unspecified osteomyelitis, site unspecified   . Polymyalgia rheumatica   . Paralysis agitans   . Osteoarthrosis, unspecified whether generalized or localized, unspecified site   . Muscle weakness (generalized)   . Difficulty in walking(719.7)   . Parkinson disease   . PMR (polymyalgia rheumatica)   . Coronary atherosclerosis of unspecified type of vessel, native or graft     2007: LAD PCI with a BMS, 80% distal LCX stenosis treated medically. Most recent stress test in 04/2012 showed distal anterio/apical scar? with no ischemia, EF 53%.   [name] Unspecified essential  hypertension   . Other and unspecified hyperlipidemia   . Thoracic ascending aortic aneurysm     4.0 cm  . Complication of anesthesia     2013- hallucinations- [facility]. (-spinal anesth. 2013- no problems   redo- hip- L)  . Esophageal reflux     pt. reports that its resolved   . Neuromuscular disorder     parkinson's - [facility] - neurologist   . Cancer     left breast cancer- lumpectomy, chemo, radiation, tamoxifen , SLNBx  . Anemia, unspecified     bld. transfusion- 02/2013    Patient Active Problem List   Diagnosis Date Noted  . Arthritis 06/13/2015  . Benign breast lumps 06/13/2015  . Clinical depression 06/13/2015  . Hemorrhoid 06/13/2015  . Protein-calorie malnutrition, severe 05/28/2015  . Anemia 05/27/2015  . Acute blood loss anemia 05/14/2015  . Hematoma 05/14/2015  . Parkinson's disease 05/14/2015  . Generalized anxiety disorder 05/14/2015  . Coronary atherosclerosis   . Essential hypertension   . Other and unspecified hyperlipidemia   . Thoracic ascending aortic aneurysm     Past Surgical History  Procedure Laterality Date  . Knee surgery Left   . Varicose vein surgery      both legs   . Melanoma excision Right     elbow  . Knee surgery Left   . Hip replacement Bilateral   . Foot surgery Right 2013  . Rotator cuff repair Right   . Cardiac catheterization  2007    [place], [place]; x1 stent  . Cardiac  catheterization  9/14    [facility]  . Breast lumpectomy Left 1997  . Joint replacement      L-hipx5, R hipx2, Lkneex1,   . Tonsillectomy    . Cholecystectomy  2005    open repair - for gangernous   . Peripherally inserted central catheter insertion      for IV antibiotics related to repeated infection, post hip replacement  . Reverse shoulder arthroplasty Right 09/29/2013    Procedure: REVERSE SHOULDER ARTHROPLASTY;  Surgeon: Nita Sells, MD;  Location: [facility];  Service: Orthopedics;  Laterality: Right;  Right reverse total  shoulder  . Hematoma evacuation Right 05/11/2015    Procedure: EVACUATION HEMATOMA/RIGHT HIP HEMATOMA DRAINAGE WITH WOUND VAC;  Surgeon: Hessie Knows, MD;  Location: [facility];  Service: Orthopedics;  Laterality: Right;    Current Outpatient Rx  Name  Route  Sig  Dispense  Refill  . acetaminophen (TYLENOL) 325 MG tablet   Oral   Take 2 tablets (650 mg total) by mouth every 6 (six) hours as needed for mild pain (or Fever >/= 101).   30 tablet   0   . Amino Acids-Protein Hydrolys (FEEDING SUPPLEMENT, PRO-STAT SUGAR FREE 64,) LIQD   Oral   Take 30 mLs by mouth 2 (two) times daily between meals.         . ARIPiprazole (ABILIFY) 5 MG tablet   Oral   Take 1 tablet (5 mg total) by mouth daily.   30 tablet   0   . atorvastatin (LIPITOR) 20 MG tablet   Oral   Take 20 mg by mouth at bedtime.          [name] buPROPion (WELLBUTRIN) 100 MG tablet   Oral   Take 100 mg by mouth daily.         . carbidopa-levodopa (SINEMET CR) 50-200 MG per tablet   Oral   Take 2 tablets by mouth 4 (four) times daily.         . carbidopa-levodopa-entacapone (STALEVO) 50-200-200 MG per tablet   Oral   Take 1 tablet by mouth at bedtime.          . Cholecalciferol (VITAMIN D3) 1000 UNITS CAPS   Oral   Take by mouth.         . clindamycin (CLEOCIN) 300 MG capsule   Oral   Take 1 capsule (300 mg total) by mouth 2 (two) times daily.   60 capsule   3   . docusate sodium 100 MG CAPS   Oral   Take 100 mg by mouth 2 (two) times daily.   30 capsule   0   . ferrous fumarate (HEMOCYTE - 106 MG FE) 325 (106 FE) MG TABS tablet   Oral   Take 1 tablet (106 mg of iron total) by mouth 2 (two) times daily. Patient not taking: Reported on 06/13/2015   30 each   0   . Ferrous Fumarate 324 (106 FE) MG TABS   Oral   Take 1 tablet by mouth 3 (three) times daily.          [name] LORazepam (ATIVAN) 0.5 MG tablet   Oral   Take 0.5 mg by mouth every 8 (eight) hours as needed for anxiety.         .  metoprolol (LOPRESSOR) 50 MG tablet   Oral   Take 50 mg by mouth 2 (two) times daily.          . polyethylene glycol powder (GLYCOLAX/MIRALAX) powder  Oral   Take 17 g by mouth daily as needed for mild constipation or moderate constipation.         . QUEtiapine (SEROQUEL) 25 MG tablet   Oral   Take 25 mg by mouth at bedtime.         [name] QUEtiapine (SEROQUEL) 50 MG tablet   Oral   Take by mouth.         . rifampin (RIFADIN) 300 MG capsule   Oral   Take 300 mg by mouth 2 (two) times daily.         [name] rOPINIRole (REQUIP) 2 MG tablet   Oral   Take 2 mg by mouth 3 (three) times daily.         [name] senna (SENOKOT) 8.6 MG TABS tablet   Oral   Take 1 tablet (8.6 mg total) by mouth daily as needed for mild constipation.   120 each   0   . traMADol (ULTRAM) 50 MG tablet   Oral   Take 1 tablet (50 mg total) by mouth every 6 (six) hours as needed for moderate pain.   30 tablet   0   . venlafaxine XR (EFFEXOR-XR) 150 MG 24 hr capsule   Oral   Take 150 mg by mouth 2 (two) times daily.          . vitamin B-12 (CYANOCOBALAMIN) 1000 MCG tablet   Oral   Take 1,000 mcg by mouth daily.            Allergies Morphine and related; Oxycodone; Sulfa antibiotics; Tazobactam; Adhesive; and Isosorbide  Family History  Problem Relation Age of Onset  . Hypertension Mother     Social History History  Substance Use Topics  . Smoking status: Former Smoker -- 0.50 packs/day for 10 years    Types: Cigarettes    Quit date: 09/26/1984  . Smokeless tobacco: Not on file  . Alcohol Use: Yes     Comment: rarely    Review of Systems Constitutional: No fever/chills.  Generalized weakness and fatigue Eyes: No visual changes. ENT: No sore throat. Cardiovascular: Denies chest pain. Respiratory: Denies shortness of breath. Gastrointestinal: No abdominal pain.  No nausea, no vomiting.  No diarrhea.  No constipation. Genitourinary: Negative for dysuria. Musculoskeletal: Negative  for back pain.  Chronic wound on her right hip with wound VAC Skin: Negative for rash. Neurological: Negative for headaches, focal weakness or numbness.  10-point ROS otherwise negative.  ____________________________________________   PHYSICAL EXAM:  VITAL SIGNS: ED Triage Vitals  Enc Vitals Group     BP 06/17/15 0110 85/45 mmHg     Pulse Rate 06/17/15 0110 82     Resp 06/17/15 0110 16     Temp 06/17/15 0110 98.4 F (36.9 C)     Temp Source 06/17/15 0110 Oral     SpO2 06/17/15 0110 98 %     Weight 06/17/15 0110 122 lb (55.339 kg)     Height 06/17/15 0110 5\' 6"  (1.676 m)     Head Cir --      [facility] --      Pain Score 06/17/15 0113 5     Pain Loc --      Pain Edu? --      Excl. in [place]? --     Constitutional: Alert and oriented.  Has the appearance of chronic illness but is in no acute distress. Eyes: Conjunctivae are normal. PERRL. EOMI. Head: Atraumatic. Nose: No congestion/rhinnorhea. Mouth/Throat: Mucous membranes are dry.  Oropharynx non-erythematous. Neck: No stridor.   Cardiovascular: Normal rate, regular rhythm. Grossly normal heart sounds.  Good peripheral circulation. Respiratory: Normal respiratory effort.  No retractions. Lungs CTAB. Gastrointestinal: Soft and nontender. No distention. No abdominal bruits. No CVA tenderness. Musculoskeletal: Large wound is present on the right hip and the wound VAC is in place and draining serosanguineous fluid.  I did not remove the wound VAC.  There is ecchymosis extending down to the lower leg which the family states is better than previous. Neurologic:  Normal speech and language. No gross focal neurologic deficits are appreciated. Speech is normal. Skin:  Skin is warm, dry and intact. No rash noted.  Patient has an early sacral decubitus ulcer.   ____________________________________________   LABS (all labs ordered are listed, but only abnormal results are displayed)  Labs Reviewed  CBC - Abnormal; Notable for the  following:    RBC 2.58 (*)    Hemoglobin 7.2 (*)    HCT 22.0 (*)    RDW 19.8 (*)    Platelets 567 (*)    All other components within normal limits  COMPREHENSIVE METABOLIC PANEL - Abnormal; Notable for the following:    Sodium 128 (*)    Chloride 94 (*)    Calcium 7.8 (*)    Total Protein 5.3 (*)    Albumin 2.0 (*)    AST 12 (*)    ALT <5 (*)    All other components within normal limits  URINALYSIS COMPLETEWITH MICROSCOPIC ([facility] ONLY)  TYPE AND SCREEN  PREPARE RBC (CROSSMATCH)   ____________________________________________  EKG  ED ECG REPORT I, Jonnatan Hanners, the attending physician, personally viewed and interpreted this ECG.   Date: 06/17/2015  EKG Time: 03:27  Rate: 77  Rhythm: normal sinus rhythm  Axis: Normal  Intervals:Normal intervals, +LVH  ST&T Change: No evidence of acute ischemia  ____________________________________________  RADIOLOGY  No results found.  ____________________________________________   PROCEDURES  Procedure(s) performed: None  Critical Care performed: No ____________________________________________   INITIAL IMPRESSION / ASSESSMENT AND PLAN / ED COURSE  Pertinent labs & imaging results that were available during my care of the patient were reviewed by me and considered in my medical decision making (see chart for details).  Symptomatic anemia with a source (leg hematoma) with hypotension.  No indication for rectal exam and the patient refuses.  I am providing one unit of PRBCs in the emergency department and admitting for further management and orthopedics consultation with Dr. Rudene Christians.  The patient and family understand and agree.  I will defer imaging to the consultant.  ____________________________________________  FINAL CLINICAL IMPRESSION(S) / ED DIAGNOSES  Final diagnoses:  Symptomatic anemia  Thigh hematoma, right, subsequent encounter      NEW MEDICATIONS STARTED DURING THIS VISIT:  New Prescriptions   No  medications on file     Hinda Kehr, MD 06/17/15 [phone]

## 2015-06-17 NOTE — ED Notes (Signed)
Consent signed by pt's daughter (d/t pt's mental status) and placed on chart.

## 2015-06-17 NOTE — Progress Notes (Signed)
[facility] NAME: Sabrina Duncan    MR#:  [id number]  DATE OF BIRTH:  04-28-1941  SUBJECTIVE:  CHIEF COMPLAINT:   Chief Complaint  Patient presents with  . Anemia   No complaint. REVIEW OF SYSTEMS:  CONSTITUTIONAL: No fever, generalized weakness.  EYES: No blurred or double vision.  EARS, NOSE, AND THROAT: No tinnitus or ear pain.  RESPIRATORY: No cough, shortness of breath, wheezing or hemoptysis.  CARDIOVASCULAR: No chest pain, orthopnea, edema.  GASTROINTESTINAL: No nausea, vomiting, diarrhea or abdominal pain.  GENITOURINARY: No dysuria, hematuria.  ENDOCRINE: No polyuria, nocturia,  HEMATOLOGY: positive anemia, bleeding from wound VAC. SKIN: No rash or lesion. MUSCULOSKELETAL: No joint pain or arthritis.   NEUROLOGIC: No tingling, numbness, weakness.  PSYCHIATRY: No anxiety or depression.   DRUG ALLERGIES:   Allergies  Allergen Reactions  . Morphine And Related Other (See Comments)    Reaction:  Hallucinations   . Oxycodone Other (See Comments)    Reaction:  Unknown   . Sulfa Antibiotics Itching  . Tazobactam Other (See Comments)    Reaction:  Unknown   . Adhesive [Tape] Rash  . Isosorbide Rash    VITALS:  Blood pressure 99/57, pulse 86, temperature 98.7 F (37.1 C), temperature source Oral, resp. rate 19, height 5\' 6"  (1.676 m), weight 55.339 kg (122 lb), SpO2 98 %.  PHYSICAL EXAMINATION:  GENERAL:  74 y.o.-year-old patient lying in the bed with no acute distress.  EYES: Pupils equal, round, reactive to light and accommodation. No scleral icterus. Extraocular muscles intact. Pale conjunctiva. HEENT: Head atraumatic, normocephalic. Oropharynx and nasopharynx clear.  NECK:  Supple, no jugular venous distention. No thyroid enlargement, no tenderness.  LUNGS: Normal breath sounds bilaterally, no wheezing, rales,rhonchi or crepitation. No use of accessory muscles of respiration.  CARDIOVASCULAR: S1, S2  normal. No murmurs, rubs, or gallops.  ABDOMEN: Soft, nontender, nondistended. Bowel sounds present. No organomegaly or mass.  EXTREMITIES: No pedal edema, cyanosis, or clubbing. Left hip in dressing, on wound VAC with pink bloody drainage. NEUROLOGIC: Cranial nerves II through XII are intact. Muscle strength 4/5 in all extremities. Sensation intact. Gait not checked.  PSYCHIATRIC: The patient is alert and oriented x 3.  SKIN: No obvious rash, lesion, or ulcer.    LABORATORY PANEL:   CBC  Recent Labs Lab 06/17/15 0121  WBC 4.6  HGB 7.2*  HCT 22.0*  PLT 567*   ------------------------------------------------------------------------------------------------------------------  Chemistries   Recent Labs Lab 06/17/15 0121  NA 128*  K 4.4  CL 94*  CO2 26  GLUCOSE 90  BUN 19  CREATININE 0.56  CALCIUM 7.8*  AST 12*  ALT <5*  ALKPHOS 107  BILITOT 0.5   ------------------------------------------------------------------------------------------------------------------  Cardiac Enzymes No results for input(s): TROPONINI in the last 168 hours. ------------------------------------------------------------------------------------------------------------------  RADIOLOGY:  No results found.  EKG:   Orders placed or performed during the hospital encounter of 06/17/15  . ED EKG  . ED EKG  . EKG 12-Lead  . EKG 12-Lead    ASSESSMENT AND PLAN:   1. Anemia:  s/p 1 unit of packed red blood cells.f/u Hb.  * Hyponatremia. NS iv, f/u BMP.  2. Coronary artery disease: Stable; d/c metoprolol due to low BP. Cont statin therapy.  3. Pressure ulcers: Wound consult placed 4. Surgical site bleeding : cont Antibiotics and f/u ortho team.  5. Parkinson's disease: Continue carbidopa levodopa   All the records are reviewed and case discussed with Care Management/Social  Workerr. Management plans discussed with the patient, family and they are in agreement.  CODE STATUS:full  code  TOTAL TIME TAKING CARE OF THIS PATIENT: 41 minutes.   POSSIBLE D/C IN 2 DAYS, DEPENDING ON CLINICAL CONDITION.   Demetrios Loll M.D on 06/17/2015 at 12:21 PM  Between 7am to 6pm - Pager - [phone]  After 6pm go to [email or web address] - password [email or web address] [facility]  Office  [phone]  CC: Primary care physician; Idelle Crouch, MD

## 2015-06-17 NOTE — H&P (Signed)
Sabrina Duncan is an 74 y.o. female.   Chief Complaint: Bleeding HPI: Patient presents emergency department complaining of soreness in her right hip. She's had a hip replacement and multiple revisions due to bleeding hematoma at the surgical site. She's been informed that she is anemic and presents to the hospital for transfusion and further surgical repair. Patient denies chest pain or shortness of breath. Her condition was discussed with the orthopedic surgeon on call who recommended admission to the internal medicine service for medical management.  Past Medical History  Diagnosis Date  . Depressive disorder, not elsewhere classified   . Unspecified osteomyelitis, site unspecified   . Polymyalgia rheumatica   . Paralysis agitans   . Osteoarthrosis, unspecified whether generalized or localized, unspecified site   . Muscle weakness (generalized)   . Difficulty in walking(719.7)   . Parkinson disease   . PMR (polymyalgia rheumatica)   . Coronary atherosclerosis of unspecified type of vessel, native or graft     2007: LAD PCI with a BMS, 80% distal LCX stenosis treated medically. Most recent stress test in 04/2012 showed distal anterio/apical scar? with no ischemia, EF 53%.   . Unspecified essential hypertension   . Other and unspecified hyperlipidemia   . Thoracic ascending aortic aneurysm     4.0 cm  . Complication of anesthesia     2013- hallucinations- [facility] [place] (-spinal anesth. 2013- no problems   redo- hip- L)  . Esophageal reflux     pt. reports that its resolved   . Neuromuscular disorder     parkinson's - [facility] - neurologist   . Cancer     left breast cancer- lumpectomy, chemo, radiation, tamoxifen , SLNBx  . Anemia, unspecified     bld. transfusion- 02/2013    Past Surgical History  Procedure Laterality Date  . Knee surgery Left   . Varicose vein surgery      both legs   . Melanoma excision Right     elbow  . Knee surgery Left   .  Hip replacement Bilateral   . Foot surgery Right 2013  . Rotator cuff repair Right   . Cardiac catheterization  2007    [place], [place]; x1 stent  . Cardiac catheterization  9/14    [facility]  . Breast lumpectomy Left 1997  . Joint replacement      L-hipx5, R hipx2, Lkneex1,   . Tonsillectomy    . Cholecystectomy  2005    open repair - for gangernous   . Peripherally inserted central catheter insertion      for IV antibiotics related to repeated infection, post hip replacement  . Reverse shoulder arthroplasty Right 09/29/2013    Procedure: REVERSE SHOULDER ARTHROPLASTY;  Surgeon: Justin William Chandler, MD;  Location: [facility] OR;  Service: Orthopedics;  Laterality: Right;  Right reverse total shoulder  . Hematoma evacuation Right 05/11/2015    Procedure: EVACUATION HEMATOMA/RIGHT HIP HEMATOMA DRAINAGE WITH WOUND VAC;  Surgeon:  Menz, MD;  Location: [facility] ORS;  Service: Orthopedics;  Laterality: Right;    Family History  Problem Relation Age of Onset  . Hypertension Mother    Social History:  reports that she quit smoking about 30 years ago. Her smoking use included Cigarettes. She has a 5 pack-year smoking history. She does not have any smokeless tobacco history on file. She reports that she drinks alcohol. She reports that she does not use illicit drugs.  Allergies:  Allergies  Allergen Reactions  . Morphine And   Related Other (See Comments)    Reaction:  Hallucinations   . Oxycodone Other (See Comments)    Reaction:  Unknown   . Sulfa Antibiotics Itching  . Tazobactam Other (See Comments)    Reaction:  Unknown   . Adhesive [Tape] Rash  . Isosorbide Rash    Prior to Admission medications   Medication Sig Start Date End Date Taking? Authorizing Provider  acetaminophen (TYLENOL) 325 MG tablet Take 2 tablets (650 mg total) by mouth every 6 (six) hours as needed for mild pain (or Fever >/= 101). 05/14/15  Yes Aldean Jewett, MD  Amino Acids-Protein Hydrolys (FEEDING SUPPLEMENT,  PRO-STAT SUGAR FREE 64,) LIQD Take 30 mLs by mouth 2 (two) times daily between meals.   Yes Historical Provider, MD  ARIPiprazole (ABILIFY) 5 MG tablet Take 1 tablet (5 mg total) by mouth daily. 05/14/15  Yes Aldean Jewett, MD  atorvastatin (LIPITOR) 20 MG tablet Take 20 mg by mouth at bedtime.    Yes Historical Provider, MD  carbidopa-levodopa (SINEMET CR) 50-200 MG per tablet Take 2 tablets by mouth 4 (four) times daily.   Yes Historical Provider, MD  carbidopa-levodopa-entacapone (STALEVO) 50-200-200 MG per tablet Take 1 tablet by mouth at bedtime.    Yes Historical Provider, MD  Cholecalciferol (VITAMIN D3) 1000 UNITS CAPS Take 2 capsules by mouth daily.    Yes Historical Provider, MD  clindamycin (CLEOCIN) 300 MG capsule Take 1 capsule (300 mg total) by mouth 2 (two) times daily. 08/24/13  Yes Carlyle Basques, MD  docusate sodium 100 MG CAPS Take 100 mg by mouth 2 (two) times daily. 10/03/13  Yes Tania Ade, MD  Ferrous Fumarate 324 (106 FE) MG TABS Take 1 tablet by mouth 2 (two) times daily.    Yes Historical Provider, MD  LORazepam (ATIVAN) 0.5 MG tablet Take 0.5 mg by mouth every 8 (eight) hours as needed for anxiety.   Yes Historical Provider, MD  metoprolol (LOPRESSOR) 50 MG tablet Take 50 mg by mouth 2 (two) times daily.    Yes Historical Provider, MD  polyethylene glycol powder (GLYCOLAX/MIRALAX) powder Take 17 g by mouth daily as needed for mild constipation or moderate constipation.   Yes Historical Provider, MD  QUEtiapine (SEROQUEL) 25 MG tablet Take 75 mg by mouth at bedtime.    Yes Historical Provider, MD  QUEtiapine (SEROQUEL) 50 MG tablet Take 50 mg by mouth daily.  06/06/15 07/06/15 Yes Historical Provider, MD  rifampin (RIFADIN) 300 MG capsule Take 300 mg by mouth 2 (two) times daily.   Yes Historical Provider, MD  rOPINIRole (REQUIP) 2 MG tablet Take 2 mg by mouth 3 (three) times daily.   Yes Historical Provider, MD  senna (SENOKOT) 8.6 MG TABS tablet Take 1 tablet (8.6 mg  total) by mouth daily as needed for mild constipation. Patient taking differently: Take 1 tablet by mouth daily as needed for mild constipation.  05/14/15  Yes Aldean Jewett, MD  traMADol (ULTRAM) 50 MG tablet Take 1 tablet (50 mg total) by mouth every 6 (six) hours as needed for moderate pain. 05/14/15  Yes Duanne Guess, PA-C  venlafaxine XR (EFFEXOR-XR) 150 MG 24 hr capsule Take 150 mg by mouth daily with breakfast.    Yes Historical Provider, MD  vitamin B-12 (CYANOCOBALAMIN) 1000 MCG tablet Take 1,000 mcg by mouth daily.    Yes Historical Provider, MD  buPROPion (WELLBUTRIN) 100 MG tablet Take 100 mg by mouth daily.    Historical Provider, MD  ferrous fumarate (  HEMOCYTE - 106 MG FE) 325 (106 FE) MG TABS tablet Take 1 tablet (106 mg of iron total) by mouth 2 (two) times daily. Patient not taking: Reported on 06/13/2015 05/28/15   Hillary Bow, MD  '   Results for orders placed or performed during the hospital encounter of 06/17/15 (from the past 48 hour(s))  CBC     Status: Abnormal   Collection Time: 06/17/15  1:21 AM  Result Value Ref Range   WBC 4.6 3.6 - 11.0 K/uL   RBC 2.58 (L) 3.80 - 5.20 MIL/uL   Hemoglobin 7.2 (L) 12.0 - 16.0 g/dL   HCT 22.0 (L) 35.0 - 47.0 %   MCV 85.6 80.0 - 100.0 fL   MCH 27.9 26.0 - 34.0 pg   MCHC 32.6 32.0 - 36.0 g/dL   RDW 19.8 (H) 11.5 - 14.5 %   Platelets 567 (H) 150 - 440 K/uL  Comprehensive metabolic panel     Status: Abnormal   Collection Time: 06/17/15  1:21 AM  Result Value Ref Range   Sodium 128 (L) 135 - 145 mmol/L   Potassium 4.4 3.5 - 5.1 mmol/L   Chloride 94 (L) 101 - 111 mmol/L   CO2 26 22 - 32 mmol/L   Glucose, Bld 90 65 - 99 mg/dL   BUN 19 6 - 20 mg/dL   Creatinine, Ser 0.56 0.44 - 1.00 mg/dL   Calcium 7.8 (L) 8.9 - 10.3 mg/dL   Total Protein 5.3 (L) 6.5 - 8.1 g/dL   Albumin 2.0 (L) 3.5 - 5.0 g/dL   AST 12 (L) 15 - 41 U/L   ALT <5 (L) 14 - 54 U/L   Alkaline Phosphatase 107 38 - 126 U/L   Total Bilirubin 0.5 0.3 - 1.2 mg/dL    GFR calc non Af Amer >60 >60 mL/min   GFR calc Af Amer >60 >60 mL/min    Comment: (NOTE) The eGFR has been calculated using the CKD EPI equation. This calculation has not been validated in all clinical situations. eGFR's persistently <60 mL/min signify possible Chronic Kidney Disease.    Anion gap 8 5 - 15  Type and screen     Status: None (Preliminary result)   Collection Time: 06/17/15  1:21 AM  Result Value Ref Range   ABO/RH(D) O POS    Antibody Screen NEG    Sample Expiration 06/20/2015    Unit Number [id number]    Blood Component Type RBC, LR IRR    Unit division 00    Status of Unit ISSUED    Transfusion Status OK TO TRANSFUSE    Crossmatch Result Compatible    Unit Number [id number]    Blood Component Type RBC LR PHER1    Unit division 00    Status of Unit ALLOCATED    Transfusion Status OK TO TRANSFUSE    Crossmatch Result Compatible   Prepare RBC     Status: None   Collection Time: 06/17/15  1:21 AM  Result Value Ref Range   Order Confirmation ORDER PROCESSED BY BLOOD BANK    No results found.  Review of Systems  Constitutional: Negative for fever and chills.  HENT: Negative for sore throat and tinnitus.   Eyes: Negative for blurred vision and redness.  Respiratory: Negative for cough and shortness of breath.   Cardiovascular: Negative for chest pain, palpitations, orthopnea and PND.  Gastrointestinal: Negative for nausea, vomiting, abdominal pain and diarrhea.  Genitourinary: Negative for dysuria, urgency and frequency.  Musculoskeletal: Positive for  joint pain. Negative for myalgias.  Skin: Negative for rash.       No lesions  Neurological: Negative for speech change, focal weakness and weakness.  Endo/Heme/Allergies: Does not bruise/bleed easily.       No temperature intolerance  Psychiatric/Behavioral: Negative for depression and suicidal ideas.    Blood pressure 73/63, pulse 78, temperature 98.1 F (36.7 C), temperature source Oral, resp. rate  16, height 5' 6" (1.676 m), weight 55.339 kg (122 lb), SpO2 96 %. Physical Exam  Vitals reviewed. Constitutional: She is oriented to person, place, and time. She appears well-developed and well-nourished.  HENT:  Head: Normocephalic and atraumatic.  Mouth/Throat: Oropharynx is clear and moist.  Eyes: Conjunctivae and EOM are normal. Pupils are equal, round, and reactive to light. No scleral icterus.  Neck: Normal range of motion. Neck supple. No JVD present. No tracheal deviation present. No thyromegaly present.  Cardiovascular: Normal rate, regular rhythm, normal heart sounds and intact distal pulses.  Exam reveals no gallop and no friction rub.   No murmur heard. Respiratory: Effort normal and breath sounds normal.  GI: Soft. Bowel sounds are normal. She exhibits no distension. There is no tenderness.  Genitourinary:  Deferred  Musculoskeletal: Normal range of motion. She exhibits no edema.  Range of motion in left hip limited due to pain  Lymphadenopathy:    She has no cervical adenopathy.  Neurological: She is alert and oriented to person, place, and time. No cranial nerve deficit. She exhibits normal muscle tone.  Skin: Skin is warm and dry.     Psychiatric: She has a normal mood and affect. Her behavior is normal. Judgment normal.  Mild confusion/dementia     Assessment/Plan This is a 74-year-old female admitted for anemia secondary to bleeding right hip hematoma. 1. Anemia: The patient is receiving 1 unit of packed red blood cells.  We will anticipate need for more units following surgery. The patient has known coronary artery disease. Hemoglobin goal should be 8-10 g/dL.  2. Coronary artery disease: Stable; continue metoprolol and statin therapy. Continue to monitor telemetry. 3. Pressure ulcers: Wound consult placed 4. Surgical site: Antibiotics per surgical team. Consider discontinuation of rifampin and unless hardware is infected. (I have discontinued ciprofloxacin due to  unknown treatment target) 5. Parkinson's disease: Continue carbidopa levodopa 6. Depression: Continue venlafaxine, quetiapine, aripiprazole, and bupropion 7. DVT prophylaxis: SCDs (no anticoagulant due to bleeding risk) 8. GI prophylaxis: None The patient is a full code. Time spent on admission orders and patient care approximately 35 minutes  ,   S 06/17/2015, 5:39 AM    

## 2015-06-17 NOTE — ED Notes (Signed)
RN called lab to determine status of unit of blood, lab stated that they were unaware of crossmatch. Stated they would begin to prepare RBCs for transfusion. MD aware of delay, primary care RN made aware.

## 2015-06-18 ENCOUNTER — Telehealth: Payer: Self-pay | Admitting: *Deleted

## 2015-06-18 DIAGNOSIS — L8990 Pressure ulcer of unspecified site, unspecified stage: Secondary | ICD-10-CM | POA: Insufficient documentation

## 2015-06-18 LAB — BASIC METABOLIC PANEL
ANION GAP: 7 (ref 5–15)
BUN: 12 mg/dL (ref 6–20)
CO2: 23 mmol/L (ref 22–32)
Calcium: 7.5 mg/dL — ABNORMAL LOW (ref 8.9–10.3)
Chloride: 105 mmol/L (ref 101–111)
Creatinine, Ser: 0.51 mg/dL (ref 0.44–1.00)
Glucose, Bld: 85 mg/dL (ref 65–99)
POTASSIUM: 4 mmol/L (ref 3.5–5.1)
SODIUM: 135 mmol/L (ref 135–145)

## 2015-06-18 LAB — CBC
HEMATOCRIT: 23.3 % — AB (ref 35.0–47.0)
Hemoglobin: 7.8 g/dL — ABNORMAL LOW (ref 12.0–16.0)
MCH: 28.8 pg (ref 26.0–34.0)
MCHC: 33.5 g/dL (ref 32.0–36.0)
MCV: 86 fL (ref 80.0–100.0)
Platelets: 415 10*3/uL (ref 150–440)
RBC: 2.72 MIL/uL — AB (ref 3.80–5.20)
RDW: 19.7 % — ABNORMAL HIGH (ref 11.5–14.5)
WBC: 5.3 10*3/uL (ref 3.6–11.0)

## 2015-06-18 LAB — URINALYSIS COMPLETE WITH MICROSCOPIC (ARMC ONLY)
Bilirubin Urine: NEGATIVE
Glucose, UA: NEGATIVE mg/dL
Nitrite: NEGATIVE
Protein, ur: NEGATIVE mg/dL
Specific Gravity, Urine: 1.016 (ref 1.005–1.030)
pH: 5 (ref 5.0–8.0)

## 2015-06-18 LAB — MAGNESIUM: MAGNESIUM: 1.7 mg/dL (ref 1.7–2.4)

## 2015-06-18 LAB — PREPARE RBC (CROSSMATCH)

## 2015-06-18 MED ORDER — MAGNESIUM SULFATE 2 GM/50ML IV SOLN
2.0000 g | Freq: Once | INTRAVENOUS | Status: AC
  Administered 2015-06-18: 2 g via INTRAVENOUS
  Filled 2015-06-18: qty 50

## 2015-06-18 MED ORDER — ENSURE ENLIVE PO LIQD: 237.0000 mL | Freq: Two times a day (BID) | ORAL | Status: DC

## 2015-06-18 MED ORDER — SODIUM CHLORIDE 0.9 % IV SOLN
Freq: Once | INTRAVENOUS | Status: AC
  Administered 2015-06-18: 16:00:00 via INTRAVENOUS

## 2015-06-18 MED ORDER — COLLAGENASE 250 UNIT/GM EX OINT
TOPICAL_OINTMENT | Freq: Every day | CUTANEOUS | Status: DC
  Administered 2015-06-19 – 2015-06-24 (×6): via TOPICAL
  Filled 2015-06-18 (×2): qty 30

## 2015-06-18 MED ORDER — SODIUM CHLORIDE 0.9 % IV BOLUS (SEPSIS)
1000.0000 mL | Freq: Once | INTRAVENOUS | Status: AC
  Administered 2015-06-18: 1000 mL via INTRAVENOUS

## 2015-06-18 NOTE — Clinical Social Work Note (Addendum)
CSW spoke to pt's daughter Dadaand advised her of the bed offers.  CSW gave Hinton Dyer the contact information for [facility].  She stated that she wanted to have a family member look at the facility before she made a decision about placement.  CSW will f/u with pt's daughter again tomorrow.

## 2015-06-18 NOTE — Consult Note (Signed)
Patient with right hip hematoma s/p extensive revision THA in [place] 2 months ago.  Has persistent drainage and had discussed I+D last week. Will tentatively schedule for tomorrow if medically stable.

## 2015-06-18 NOTE — Telephone Encounter (Signed)
As noted above 

## 2015-06-18 NOTE — Progress Notes (Signed)
[facility] called into representative Misty at [phone].    Confirmation:  [id number] [facility] Bed w/Alteranate Mattress  Delivery Representative to contact Unit Secretary for ETA.  Will continue to monitor for delivery.

## 2015-06-18 NOTE — Progress Notes (Signed)
Dr Jannifer Franklin notified of BP 84/65.Stated would watch for now.

## 2015-06-18 NOTE — Progress Notes (Signed)
Dr Jannifer Franklin notified of BP 80/42. Orders given. Informed of bleeding from wound vac, informed wound vac site reinforced verbalizes ok.

## 2015-06-18 NOTE — Progress Notes (Signed)
Initial Nutrition Assessment    INTERVENTION:   (Nutrition Supplement Therapy: ) Recommend Ensure Enlive po BID, each supplement provides 350 kcal and 20 grams of protein   NUTRITION DIAGNOSIS:  Inadequate oral intake related to acute illness as evidenced by percent weight loss.    GOAL:  Patient will meet greater than or equal to 90% of their needs    MONITOR:   (Energy intake, Anthropometrics)  REASON FOR ASSESSMENT:  Malnutrition Screening Tool    ASSESSMENT:  Pt admitted with anemia, hyponatremia, right hip hematoma.  Admitted with wound vac  PMHx:  Past Medical History  Diagnosis Date  . Depressive disorder, not elsewhere classified   . Unspecified osteomyelitis, site unspecified   . Polymyalgia rheumatica   . Paralysis agitans   . Osteoarthrosis, unspecified whether generalized or localized, unspecified site   . Muscle weakness (generalized)   . Difficulty in walking(719.7)   . Parkinson disease   . PMR (polymyalgia rheumatica)   . Coronary atherosclerosis of unspecified type of vessel, native or graft     2007: LAD PCI with a BMS, 80% distal LCX stenosis treated medically. Most recent stress test in 04/2012 showed distal anterio/apical scar? with no ischemia, EF 53%.   [name] Unspecified essential hypertension   . Other and unspecified hyperlipidemia   . Thoracic ascending aortic aneurysm     4.0 cm  . Complication of anesthesia     2013- hallucinations- [facility] (-spinal anesth. 2013- no problems   redo- hip- L)  . Esophageal reflux     pt. reports that its resolved   . Neuromuscular disorder     parkinson's - [facility] - neurologist   . Cancer     left breast cancer- lumpectomy, chemo, radiation, tamoxifen , SLNBx  . Anemia, unspecified     bld. transfusion- 02/2013    Diet Order:regular with prostat BID  Current Nutrition: ate few bites this am with CNA present and assisting with eating.    Food/Nutrition-Related History:  Pt confused this am, no family present. Unsure intake prior to admission.  Familiar with pt from previous admission 3 weeks ago and at that time pt reported poor po intake (eating bites) for the past month   Medications: NS at 55ml/hr, fe, colace,   Electrolyte/Renal Profile and Glucose Profile:   Recent Labs Lab 06/17/15 0121 06/18/15 0442  NA 128* 135  K 4.4 4.0  CL 94* 105  CO2 26 23  BUN 19 12  CREATININE 0.56 0.51  CALCIUM 7.8* 7.5*  MG  --  1.7  GLUCOSE 90 85   Protein Profile:   Recent Labs Lab 06/17/15 0121  ALBUMIN 2.0*     Last BM: noted 6/26 and smear on 6/27   Nutrition-Focused Physical Exam Findings: Nutrition-Focused physical exam completed. Findings are mild fat depletion in upper arm , mild/moderate depletion in temple, clavicle, hand  muscle depletion, and unable to assess edema.      Weight Change: 22% weight loss in the last month Anthropometrics:    Height:  Ht Readings from Last 1 Encounters:  06/17/15 5\' 6"  (1.676 m)    Weight:  Wt Readings from Last 1 Encounters:  06/18/15 98 lb 4.8 oz (44.589 kg)       Wt Readings from Last 10 Encounters:  06/18/15 98 lb 4.8 oz (44.589 kg)  06/13/15 122 lb (55.339 kg)  05/28/15 127 lb 3.2 oz (57.698 kg)  05/09/15 138 lb (62.596 kg)  10/02/14 143 lb 8  oz (65.091 kg)  02/13/14 149 lb 12 oz (67.926 kg)  09/26/13 154 lb (69.854 kg)  09/22/13 154 lb 8 oz (70.081 kg)  09/21/13 155 lb (70.308 kg)  08/24/13 155 lb (70.308 kg)    BMI:  Body mass index is 15.87 kg/(m^2).  Estimated Nutritional Needs:  Kcal:  Using IBW of 59kg (BEE 1116 kcals (IF 1.0-1.2, AF 1.2) 5465-0354 kcals/d.   Protein:  Using IBW of 59kg (1.2-1.5 g/kg) 71-89 g/d  Fluid:  Using IBW of 59kg (25-27ml/kg) 1475-1760ml/d  Skin: multiple pressure ulcers noted    Diet Order:  Diet regular Room service appropriate?: Yes; Fluid consistency:: Thin Diet NPO time specified Except for: Sips with Meds  EDUCATION NEEDS:  No  education needs identified at this time   Intake/Output Summary (Last 24 hours) at 06/18/15 1030 Last data filed at 06/18/15 0900  Gross per 24 hour  Intake   2762 ml  Output    403 ml  Net   2359 ml     HIGH Care Level Tameah Mihalko B. Zenia Resides, [place], [place] (pager)

## 2015-06-18 NOTE — Care Management Note (Signed)
Case Management Note  Patient Details  Name: Sabrina Duncan MRN: [id number] Date of Birth: 01-29-1941  Subjective/Objective:                    Action/Plan:   Expected Discharge Date:                  Expected Discharge Plan:     In-House Referral:     Discharge planning Services     Post Acute Care Choice:    Choice offered to:     DME Arranged:    DME Agency:     HH Arranged:    [place] Agency:     Status of Service:     Medicare Important Message Given:   Yes Date Medicare IM Given:   06/18/15 Medicare IM give by:   L.Jessice Madill, RN Date Additional Medicare IM Given:    Additional Medicare Important Message give by:     If discussed at [place] of Stay Meetings, dates discussed:    Additional Comments:  Joenathan Sakuma,Deaven A, RN 06/18/2015, 9:59 AM

## 2015-06-18 NOTE — Consult Note (Addendum)
WOC wound consult note Reason for Consult: Right hip nonhealing surgical wound, followed by surgery. Sacral unstageable pressure ulcer Moisture Associated Skin Damage to bilateral gluteal folds.  Nonblanchable redness to right knee, left elbow, resolving with offloading.  Bilateral redness  Heels, offloaded with Prevalon boot.  Wound type: Pressure ulcers Surgical wound followed by surgery.  Back to OR for I & D tomorrow.  Dr Rudene Christians does not want NPWT dressing changed today.  Pressure Ulcer POA: Yes Measurement: Sacrum 4 cm x 2 cm x 1 cm 100% slough in wound bed.  Bilateral gluteal folds 1 cm x 1 cm x 0.1 cm moisture associated skin damage (MASD).  No disposables to skin.  Right knee 1 cm x 1 cm erythema Left elbow 1 cm x 1 cm erythema Bilateral heels reddened, offloading boots in place.  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  Dr Rudene Christians does not wish this to be changed today.  Drainage (amount, consistency, odor) RIght hip, heavy, bloody drainage.  Sacrum, minimal serosanguinous drainage.  No odor. Periwound: Intact Dressing procedure/placement/frequency: Cleanse sacrum with NS and pat gently dry.  Apply Santyl ointment to wound bed.  Cover with NS moist dressing.  Cover with ABD pad and secure with tape.  Change daily.   Cleanse bilateral buttocks breakdown with soap and water.  Apply barrier cream twice daily and PRN soilage. Offload heels with Prevalon boot LALM for pressure redistribution.   Will not follow at this time.  Please re-consult if needed.  Domenic Moras RN BSN [place] Pager [phone]

## 2015-06-18 NOTE — Progress Notes (Signed)
[facility] [name] NAME: Sabrina Duncan    MR#:  [id number]  DATE OF BIRTH:  1941/04/01  SUBJECTIVE:  CHIEF COMPLAINT:   Chief Complaint  Patient presents with  . Anemia   No complaint. Confused. REVIEW OF SYSTEMS:  CONSTITUTIONAL: No fever, generalized weakness.  EYES: No blurred or double vision.  EARS, NOSE, AND THROAT: No tinnitus or ear pain.  RESPIRATORY: No cough, shortness of breath, wheezing or hemoptysis.  CARDIOVASCULAR: No chest pain, orthopnea, edema.  GASTROINTESTINAL: No nausea, vomiting, diarrhea or abdominal pain.  GENITOURINARY: No dysuria, hematuria.  ENDOCRINE: No polyuria, nocturia,  HEMATOLOGY: positive anemia, bleeding from wound VAC. SKIN: No rash or lesion. MUSCULOSKELETAL: No joint pain or arthritis.   NEUROLOGIC: No tingling, numbness, weakness.  PSYCHIATRY: No anxiety or depression.   DRUG ALLERGIES:   Allergies  Allergen Reactions  . Morphine And Related Other (See Comments)    Reaction:  Hallucinations   . Oxycodone Other (See Comments)    Reaction:  Unknown   . Sulfa Antibiotics Itching  . Tazobactam Other (See Comments)    Reaction:  Unknown   . Adhesive [Tape] Rash  . Isosorbide Rash    VITALS:  Blood pressure 88/49, pulse 110, temperature 98.4 F (36.9 C), temperature source Oral, resp. rate 18, height 5\' 6"  (1.676 m), weight 44.589 kg (98 lb 4.8 oz), SpO2 91 %.  PHYSICAL EXAMINATION:  GENERAL:  74 y.o.-year-old patient lying in the bed with no acute distress.  EYES: Pupils equal, round, reactive to light and accommodation. No scleral icterus. Extraocular muscles intact. Pale conjunctiva. HEENT: Head atraumatic, normocephalic. Oropharynx and nasopharynx clear.  NECK:  Supple, no jugular venous distention. No thyroid enlargement, no tenderness.  LUNGS: Normal breath sounds bilaterally, no wheezing, rales,rhonchi or crepitation. No use of accessory muscles of respiration.   CARDIOVASCULAR: S1, S2 normal. No murmurs, rubs, or gallops.  ABDOMEN: Soft, nontender, nondistended. Bowel sounds present. No organomegaly or mass.  EXTREMITIES: No pedal edema, cyanosis, or clubbing. Left hip in dressing, on wound VAC with a lot of bloody drainage. NEUROLOGIC: Cranial nerves II through XII are intact. Muscle strength 4/5 in all extremities. Sensation intact. Gait not checked.  PSYCHIATRIC: The patient is alert and oriented x 3.  SKIN: No obvious rash, lesion, or ulcer.    LABORATORY PANEL:   CBC  Recent Labs Lab 06/18/15 0442  WBC 5.3  HGB 7.8*  HCT 23.3*  PLT 415   ------------------------------------------------------------------------------------------------------------------  Chemistries   Recent Labs Lab 06/17/15 0121 06/18/15 0442  NA 128* 135  K 4.4 4.0  CL 94* 105  CO2 26 23  GLUCOSE 90 85  BUN 19 12  CREATININE 0.56 0.51  CALCIUM 7.8* 7.5*  MG  --  1.7  AST 12*  --   ALT <5*  --   ALKPHOS 107  --   BILITOT 0.5  --    ------------------------------------------------------------------------------------------------------------------  Cardiac Enzymes No results for input(s): TROPONINI in the last 168 hours. ------------------------------------------------------------------------------------------------------------------  RADIOLOGY:  No results found.  EKG:   Orders placed or performed during the hospital encounter of 06/17/15  . ED EKG  . ED EKG  . EKG 12-Lead  . EKG 12-Lead    ASSESSMENT AND PLAN:   1. Anemia: due to acute blood loss. s/p 1 unit of packed red blood cells.Hb up to 9 but down to 7.8 today, will give another PRBC transfusion. f/u Hb.  * Hyponatremia. Improved, continue NS iv, f/u BMP.  2. Coronary artery disease: Stable; d/c metoprolol due to low BP. Cont statin therapy.  3. Pressure ulcers: Wound consult placed 4. Surgical site bleeding : cont Antibiotics and f/u Dr. Rudene Christians for I&D. 5. Parkinson's disease:  Continue carbidopa levodopa   All the records are reviewed and case discussed with Care Management/Social Workerr. Management plans discussed with the patient, family and they are in agreement.  CODE STATUS:full code  TOTAL TIME TAKING CARE OF THIS PATIENT: 41 minutes.   POSSIBLE D/C IN 2 DAYS, DEPENDING ON CLINICAL CONDITION.   Demetrios Loll M.D on 06/18/2015 at 2:10 PM  Between 7am to 6pm - Pager - [phone]  After 6pm go to [email or web address] - password [email or web address] [facility]  Office  [phone]  CC: Primary care physician; Idelle Crouch, MD

## 2015-06-18 NOTE — Clinical Social Work Placement (Signed)
   CLINICAL SOCIAL WORK PLACEMENT  NOTE  Date:  06/18/2015  Patient Details  Name: Sabrina Duncan MRN: [id number] Date of Birth: June 18, 1941  Clinical Social Work is seeking post-discharge placement for this patient at the [place] level of care (*CSW will initial, date and re-position this form in  chart as items are completed):  Yes   Patient/family provided with [facility] Work Department's list of facilities offering this level of care within the geographic area requested by the patient (or if unable, by the patient's family).  Yes   Patient/family informed of their freedom to choose among providers that offer the needed level of care, that participate in Medicare, Medicaid or managed care program needed by the patient, have an available bed and are willing to accept the patient.  Yes   Patient/family informed of [facility]'s ownership interest in [facility] and [facility], as well as of the fact that they are under no obligation to receive care at these facilities.  PASRR submitted to EDS on       PASRR number received on       Existing PASRR number confirmed on 06/18/15     FL2 transmitted to all facilities in geographic area requested by pt/family on 06/18/15     FL2 transmitted to all facilities within larger geographic area on       Patient informed that his/her managed care company has contracts with or will negotiate with certain facilities, including the following:            Patient/family informed of bed offers received.  Patient chooses bed at       Physician recommends and patient chooses bed at      Patient to be transferred to   on  .  Patient to be transferred to facility by       Patient family notified on   of transfer.  Name of family member notified:        PHYSICIAN       Additional Comment:    _______________________________________________ [name], LCSW 06/18/2015, 11:32  AM

## 2015-06-18 NOTE — Clinical Social Work Note (Addendum)
Clinical Social Work Assessment  Patient Details  Name: Sabrina Duncan MRN: [id number] Date of Birth: January 11, 1941  Date of referral:  06/17/15               Reason for consult:  Facility Placement                Permission sought to share information with:  Family Supports, Customer service manager Permission granted to share information::  No (Pt is confused.)  Name::     Sabrina Duncan  Agency::     Relationship::  son  Contact Information:  [phone]  Housing/Transportation Living arrangements for the past 2 months:  [place], [place] of Information:  Adult Children (son, Sabrina Duncan) Patient Interpreter Needed:  None Criminal Activity/Legal Involvement Pertinent to Current Situation/Hospitalization:  No - Comment as needed Significant Relationships:  Adult Children Lives with:  Facility Resident Do you feel safe going back to the place where you live?  No (Family does not feel that pt should return to [facility]) Need for family participation in patient care:  Yes (Comment)  Care giving concerns:  Pt is currently confused, has multiple pressure ulcers and has a wound vac.  Pt requires a sitter and needs assistance with feeding   Social Worker assessment / plan:  CSW spoke to pt's son Sabrina Duncan by phone as pt is currently confused.  Pt is a 74 y/o female who has been receiving STR at [facility] for approximately 5 weeks.  Pt is a resident at [facility] and has been residing there for 1 1/2 years.  Pt's son Sabrina Duncan is the [name] and he lives in [place].  Pt's son Sabrina Duncan lives locally.  Pt's family is very supportive.  CSW inquired as to whether pt/family plans for pt to return to [facility].  Pt's son explained that pt did not receive the care she needed and they do not want her to return to [facility].  The family is not interested in [facility] either. Pt's son stated the  family prefers [place] or [place].  CSW explained the referral process to pt's son.  CSW initiated bed search and will extend bed offers once received.  Employment status:  Retired Forensic scientist:  Medicare PT Recommendations:  Not assessed at this time [place] / Referral to community resources:  [place]  Patient/Family's Response to care:  Pt was calm and resting.  Pt's son was pleasant and focused on advocating for pt's needs.  Patient/Family's Understanding of and Emotional Response to Diagnosis, Current Treatment, and Prognosis:  Pt family expressed concern about pt's declining cognition.  Son explained that pt has declined cognitively since her surgery in [place].    Emotional Assessment Appearance:  Appears stated age Attitude/Demeanor/Rapport:  Other (Pleasant) Affect (typically observed):  Calm Orientation:  Oriented to Self Alcohol / Substance use:  Not Applicable Psych involvement (Current and /or in the community):  No (Comment)  Discharge Needs  Concerns to be addressed:  Discharge Planning Concerns Readmission within the last 30 days:  Yes Current discharge risk:  Cognitively Impaired (Pt is currently confused and requires a sitter.  Family is concerned that pt may need a sitter upon discharge.) Barriers to Discharge:  No Barriers Identified  Sabrina Duncan, Hales Duncan Sabrina Duncan, [place] 06/18/2015, 11:13 AM

## 2015-06-19 ENCOUNTER — Inpatient Hospital Stay: Payer: Medicare Other | Admitting: Anesthesiology

## 2015-06-19 ENCOUNTER — Inpatient Hospital Stay: Payer: Medicare Other

## 2015-06-19 ENCOUNTER — Encounter
Admission: EM | Disposition: A | Discharge status: Skilled Nursing Facility | Payer: Self-pay | Source: Home / Self Care | Attending: Internal Medicine

## 2015-06-19 HISTORY — PX: INCISION AND DRAINAGE HIP: SHX1801

## 2015-06-19 LAB — CBC
HCT: 25.2 % — ABNORMAL LOW (ref 35.0–47.0)
Hemoglobin: 8.2 g/dL — ABNORMAL LOW (ref 12.0–16.0)
MCH: 28.1 pg (ref 26.0–34.0)
MCHC: 32.4 g/dL (ref 32.0–36.0)
MCV: 86.7 fL (ref 80.0–100.0)
Platelets: 351 10*3/uL (ref 150–440)
RBC: 2.91 MIL/uL — ABNORMAL LOW (ref 3.80–5.20)
RDW: 18.4 % — AB (ref 11.5–14.5)
WBC: 3.8 10*3/uL (ref 3.6–11.0)

## 2015-06-19 LAB — BASIC METABOLIC PANEL
Anion gap: 3 — ABNORMAL LOW (ref 5–15)
BUN: 8 mg/dL (ref 6–20)
CALCIUM: 7.5 mg/dL — AB (ref 8.9–10.3)
CHLORIDE: 108 mmol/L (ref 101–111)
CO2: 26 mmol/L (ref 22–32)
CREATININE: 0.36 mg/dL — AB (ref 0.44–1.00)
GFR calc Af Amer: >60 mL/min (ref >60)
GFR calc non Af Amer: >60 mL/min (ref >60)
Glucose, Bld: 89 mg/dL (ref 65–99)
Potassium: 3.9 mmol/L (ref 3.5–5.1)
Sodium: 137 mmol/L (ref 135–145)

## 2015-06-19 LAB — MAGNESIUM: MAGNESIUM: 2.1 mg/dL (ref 1.7–2.4)

## 2015-06-19 SURGERY — IRRIGATION AND DEBRIDEMENT HIP
Anesthesia: General | Site: Hip | Laterality: Right | Wound class: Dirty or Infected

## 2015-06-19 MED ORDER — GLYCOPYRROLATE 0.2 MG/ML IJ SOLN
INTRAMUSCULAR | Status: DC | PRN
  Administered 2015-06-19: 0.6 mg via INTRAVENOUS

## 2015-06-19 MED ORDER — PROPOFOL 10 MG/ML IV BOLUS
INTRAVENOUS | Status: DC | PRN
  Administered 2015-06-19: 70 mg via INTRAVENOUS

## 2015-06-19 MED ORDER — CEFTRIAXONE SODIUM IN DEXTROSE 20 MG/ML IV SOLN
1.0000 g | INTRAVENOUS | Status: DC
  Administered 2015-06-19 – 2015-06-20 (×2): 1 g via INTRAVENOUS
  Filled 2015-06-19 (×4): qty 50

## 2015-06-19 MED ORDER — DEXAMETHASONE SODIUM PHOSPHATE 4 MG/ML IJ SOLN
INTRAMUSCULAR | Status: DC | PRN
  Administered 2015-06-19: 5 mg via INTRAVENOUS

## 2015-06-19 MED ORDER — NEOSTIGMINE METHYLSULFATE 10 MG/10ML IV SOLN
INTRAVENOUS | Status: DC | PRN
  Administered 2015-06-19: 3 mg via INTRAVENOUS

## 2015-06-19 MED ORDER — ROCURONIUM BROMIDE 100 MG/10ML IV SOLN
INTRAVENOUS | Status: DC | PRN
  Administered 2015-06-19: 30 mg via INTRAVENOUS

## 2015-06-19 MED ORDER — PHENYLEPHRINE HCL 10 MG/ML IJ SOLN
INTRAMUSCULAR | Status: DC | PRN
  Administered 2015-06-19: 100 ug via INTRAVENOUS

## 2015-06-19 MED ORDER — LACTATED RINGERS IV SOLN
INTRAVENOUS | Status: DC | PRN
  Administered 2015-06-19: 16:00:00 via INTRAVENOUS

## 2015-06-19 MED ORDER — FENTANYL CITRATE (PF) 100 MCG/2ML IJ SOLN
INTRAMUSCULAR | Status: DC | PRN
  Administered 2015-06-19: 100 ug via INTRAVENOUS

## 2015-06-19 MED ORDER — ONDANSETRON HCL 4 MG/2ML IJ SOLN: 4.0000 mg | Freq: Once | INTRAMUSCULAR | Status: DC | PRN

## 2015-06-19 MED ORDER — NEOMYCIN-POLYMYXIN B GU 40-200000 IR SOLN
Status: DC | PRN
  Administered 2015-06-19: 16 mL

## 2015-06-19 MED ORDER — LIDOCAINE HCL (CARDIAC) 20 MG/ML IV SOLN
INTRAVENOUS | Status: DC | PRN
  Administered 2015-06-19: 60 mg via INTRAVENOUS

## 2015-06-19 MED ORDER — FENTANYL CITRATE (PF) 100 MCG/2ML IJ SOLN
25.0000 ug | INTRAMUSCULAR | Status: DC | PRN
  Administered 2015-06-19 (×2): 12.5 ug via INTRAVENOUS

## 2015-06-19 SURGICAL SUPPLY — 41 items
BNDG COHESIVE 6X5 TAN STRL LF (GAUZE/BANDAGES/DRESSINGS) ×4 IMPLANT
CANISTER SUCT 1200ML W/VALVE (MISCELLANEOUS) ×4 IMPLANT
CANISTER SUCT 3000ML (MISCELLANEOUS) ×4 IMPLANT
CHLORAPREP W/TINT 26ML (MISCELLANEOUS) ×4 IMPLANT
DRAPE INCISE IOBAN 66X60 STRL (DRAPES) ×4 IMPLANT
DRAPE SHEET LG 3/4 BI-LAMINATE (DRAPES) ×4 IMPLANT
DRAPE WOUND VAC 10X15X1CM (MISCELLANEOUS) ×4 IMPLANT
DRSG EMULSION OIL 3X8 NADH (GAUZE/BANDAGES/DRESSINGS) ×4 IMPLANT
ELECT CAUTERY BLADE 6.4 (BLADE) ×4 IMPLANT
GAUZE PETRO XEROFOAM 1X8 (MISCELLANEOUS) ×8 IMPLANT
GAUZE SPONGE 4X4 12PLY STRL (GAUZE/BANDAGES/DRESSINGS) ×4 IMPLANT
GLOVE BIOGEL PI IND STRL 9 (GLOVE) ×2 IMPLANT
GLOVE BIOGEL PI INDICATOR 9 (GLOVE) ×2
GLOVE SURG ORTHO 9.0 STRL STRW (GLOVE) ×4 IMPLANT
GOWN SPECIALTY ULTRA XL (MISCELLANEOUS) ×4 IMPLANT
GOWN STRL REUS W/ TWL LRG LVL3 (GOWN DISPOSABLE) ×2 IMPLANT
GOWN STRL REUS W/TWL LRG LVL3 (GOWN DISPOSABLE) ×2
GOWN STRL REUS W/TWL XL LVL4 (GOWN DISPOSABLE) ×4 IMPLANT
HANDPIECE SUCTION TUBG SURGILV (MISCELLANEOUS) ×4 IMPLANT
HEMOVAC 400ML (MISCELLANEOUS) ×4
KIT DRAIN HEMOVAC JP 7FR 400ML (MISCELLANEOUS) ×2 IMPLANT
KIT RM TURNOVER STRD PROC AR (KITS) ×4 IMPLANT
NDL SAFETY 18GX1.5 (NEEDLE) ×4 IMPLANT
NEEDLE FILTER BLUNT 18X 1/2SAF (NEEDLE) ×2
NEEDLE FILTER BLUNT 18X1 1/2 (NEEDLE) ×2 IMPLANT
NEEDLE MAYO CATGUT SZ4 (NEEDLE) ×4 IMPLANT
NS IRRIG 1000ML POUR BTL (IV SOLUTION) ×4 IMPLANT
PACK HIP PROSTHESIS (MISCELLANEOUS) ×4 IMPLANT
PAD GROUND ADULT SPLIT (MISCELLANEOUS) ×4 IMPLANT
STAPLER SKIN PROX 35W (STAPLE) ×4 IMPLANT
SUT ETHIBOND #5 BRAIDED 30INL (SUTURE) ×4 IMPLANT
SUT TICRON 2-0 30IN 311381 (SUTURE) ×12 IMPLANT
SUT VIC AB 0 CT1 27 (SUTURE) ×4
SUT VIC AB 0 CT1 27XCR 8 STRN (SUTURE) ×4 IMPLANT
SUT VIC AB 1 CTX 27 (SUTURE) ×8 IMPLANT
SUT VIC AB 2-0 CT1 27 (SUTURE) ×2
SUT VIC AB 2-0 CT1 TAPERPNT 27 (SUTURE) ×2 IMPLANT
SWAB CULTURE AMIES ANAERIB BLU (MISCELLANEOUS) ×4 IMPLANT
SYRINGE 10CC LL (SYRINGE) ×4 IMPLANT
TAPE MICROFOAM 4IN (TAPE) ×4 IMPLANT
WND VAC CANISTER 500ML (MISCELLANEOUS) ×4 IMPLANT

## 2015-06-19 NOTE — Anesthesia Postprocedure Evaluation (Signed)
  Anesthesia Post-op Note  Patient: Sabrina Duncan  Procedure(s) Performed: Procedure(s) with comments: IRRIGATION AND DEBRIDEMENT HIP (Right) - hemovac placement  Anesthesia type:General  Patient location: PACU  Post pain: Pain level controlled  Post assessment: Post-op Vital signs reviewed, Patient's Cardiovascular Status Stable, Respiratory Function Stable, Patent Airway and No signs of Nausea or vomiting  Post vital signs: Reviewed and stable  Last Vitals:  Filed Vitals:   06/19/15 1744  BP: 96/52  Pulse: 93  Temp: 36.4 C  Resp: 27    Level of consciousness: awake, alert  and patient cooperative  Complications: No apparent anesthesia complications

## 2015-06-19 NOTE — Clinical Social Work Placement (Signed)
   CLINICAL SOCIAL WORK PLACEMENT  NOTE  Date:  06/19/2015  Patient Details  Name: Sabrina Duncan MRN: [id number] Date of Birth: [birth year]  Clinical Social Work is seeking post-discharge placement for this patient at the [place] level of care (*CSW will initial, date and re-position this form in  chart as items are completed):  Yes   Patient/family provided with [facility] Work Department's list of facilities offering this level of care within the geographic area requested by the patient (or if unable, by the patient's family).  Yes   Patient/family informed of their freedom to choose among providers that offer the needed level of care, that participate in Medicare, Medicaid or managed care program needed by the patient, have an available bed and are willing to accept the patient.  Yes   Patient/family informed of [facility]'s ownership interest in [facility] and [facility], as well as of the fact that they are under no obligation to receive care at these facilities.  PASRR submitted to EDS on       PASRR number received on       Existing PASRR number confirmed on 06/18/15     FL2 transmitted to all facilities in geographic area requested by pt/family on 06/18/15     FL2 transmitted to all facilities within larger geographic area on       Patient informed that his/her managed care company has contracts with or will negotiate with certain facilities, including the following:            Patient/family informed of bed offers received.  Patient chooses bed at [facility]     Physician recommends and patient chooses bed at      Patient to be transferred to [facility] on  .  Patient to be transferred to facility by       Patient family notified on   of transfer.  Name of family member notified:        PHYSICIAN       Additional Comment:    _______________________________________________ Mathews Argyle, LCSW 06/19/2015, 2:34 PM

## 2015-06-19 NOTE — Progress Notes (Signed)
[facility] at [place] NAME: Sabrina Duncan    MR#:  [id number]  DATE OF BIRTH:  01/31/1941  SUBJECTIVE:  CHIEF COMPLAINT:   Chief Complaint  Patient presents with  . Anemia   No complaint. Confused. REVIEW OF SYSTEMS:  CONSTITUTIONAL: No fever, generalized weakness.  EYES: No blurred or double vision.  EARS, NOSE, AND THROAT: No tinnitus or ear pain.  RESPIRATORY: No cough, shortness of breath, wheezing or hemoptysis.  CARDIOVASCULAR: No chest pain, orthopnea, edema.  GASTROINTESTINAL: No nausea, vomiting, diarrhea or abdominal pain.  GENITOURINARY: No dysuria, hematuria.  ENDOCRINE: No polyuria, nocturia,  HEMATOLOGY: positive anemia, bleeding from wound VAC. SKIN: No rash or lesion. MUSCULOSKELETAL: No joint pain or arthritis.   NEUROLOGIC: No tingling, numbness, weakness.  PSYCHIATRY: No anxiety or depression.   DRUG ALLERGIES:   Allergies  Allergen Reactions  . Morphine And Related Other (See Comments)    Reaction:  Hallucinations   . Oxycodone Other (See Comments)    Reaction:  Unknown   . Sulfa Antibiotics Itching  . Tazobactam Other (See Comments)    Reaction:  Unknown   . Adhesive [Tape] Rash  . Isosorbide Rash    VITALS:  Blood pressure 117/71, pulse 101, temperature 97.8 F (36.6 C), temperature source Oral, resp. rate 20, height 5\' 6"  (1.676 m), weight 46.267 kg (102 lb), SpO2 99 %.  PHYSICAL EXAMINATION:  GENERAL:  73 y.o.-year-old patient lying in the bed with no acute distress.  EYES: Pupils equal, round, reactive to light and accommodation. No scleral icterus. Extraocular muscles intact. Pale conjunctiva. HEENT: Head atraumatic, normocephalic. Oropharynx and nasopharynx clear.  NECK:  Supple, no jugular venous distention. No thyroid enlargement, no tenderness.  LUNGS: Normal breath sounds bilaterally, no wheezing, rales,rhonchi or crepitation. No use of accessory muscles of respiration.   CARDIOVASCULAR: S1, S2 normal. No murmurs, rubs, or gallops.  ABDOMEN: Soft, nontender, nondistended. Bowel sounds present. No organomegaly or mass.  EXTREMITIES: No pedal edema, cyanosis, or clubbing. Left hip in dressing, on wound VAC with a lot of bloody drainage. NEUROLOGIC: Cranial nerves II through XII are intact. Muscle strength 4/5 in all extremities. Sensation intact. Gait not checked.  PSYCHIATRIC: The patient is alert and oriented x 3.  SKIN: No obvious rash, lesion, or ulcer.    LABORATORY PANEL:   CBC  Recent Labs Lab 06/19/15 0442  WBC 3.8  HGB 8.2*  HCT 25.2*  PLT 351   ------------------------------------------------------------------------------------------------------------------  Chemistries   Recent Labs Lab 06/17/15 0121  06/19/15 0442  NA 128*  < > 137  K 4.4  < > 3.9  CL 94*  < > 108  CO2 26  < > 26  GLUCOSE 90  < > 89  BUN 19  < > 8  CREATININE 0.56  < > 0.36*  CALCIUM 7.8*  < > 7.5*  MG  --   < > 2.1  AST 12*  --   --   ALT <5*  --   --   ALKPHOS 107  --   --   BILITOT 0.5  --   --   < > = values in this interval not displayed. ------------------------------------------------------------------------------------------------------------------  Cardiac Enzymes No results for input(s): TROPONINI in the last 168 hours. ------------------------------------------------------------------------------------------------------------------  RADIOLOGY:  No results found.  EKG:   Orders placed or performed during the hospital encounter of 06/17/15  . ED EKG  . ED EKG  . EKG 12-Lead  . EKG 12-Lead  ASSESSMENT AND PLAN:   1. Anemia: due to acute blood loss. s/p 2 unit of packed red blood cells.Hb 8.2  today,  Still has bloody drainage on wound VAC. f/u Hb.  2.  Surgical site bleeding : cont Antibiotics and f/u Dr. Rudene Christians for I&D today  * Hyponatremia. Improved, continue NS iv, f/u BMP. * UTI. Start rocephin and f/u U/C.  3. Coronary  artery disease: Stable; d/c metoprolol due to low BP. Cont statin therapy.  4. Pressure ulcers: Wound care. 5. Parkinson's disease: Continue carbidopa levodopa   All the records are reviewed and case discussed with Care Management/Social Workerr. Management plans discussed with the patient, family and they are in agreement.  CODE STATUS:full code  TOTAL TIME TAKING CARE OF THIS PATIENT: 39  minutes.   POSSIBLE D/C IN 2 DAYS, DEPENDING ON CLINICAL CONDITION.   Demetrios Loll M.D on 06/19/2015 at 1:46 PM  Between 7am to 6pm - Pager - [phone]  After 6pm go to [email or web address] - password [email or web address] [facility]  Office  [phone]  CC: Primary care physician; Idelle Crouch, MD

## 2015-06-19 NOTE — Op Note (Signed)
06/17/2015 - 06/19/2015  5:03 PM  PATIENT:  Sabrina Duncan  74 y.o. female  PRE-OPERATIVE DIAGNOSIS:  right hip wound   POST-OPERATIVE DIAGNOSIS:  right hip wound   PROCEDURE:  Procedure(s) with comments: IRRIGATION AND DEBRIDEMENT HIP (Right) - hemovac placement  SURGEON: Laurene Footman, MD  ASSISTANTS: None  ANESTHESIA:   general  EBL:  Total I/O In: 534 [I.V.:534] Out: 0   BLOOD ADMINISTERED:none  DRAINS: (2) Hemovact drain(s) in the Subcutaneous tissue with  Suction Open   LOCAL MEDICATIONS USED:  NONE  SPECIMEN:  Source of Specimen:  Culture times 2 subcutaneous abscess, bone fragments apparent sequestrum  DISPOSITION OF SPECIMEN:  PATHOLOGY   COUNTS:  YES  TOURNIQUET:  * No tourniquets in log *  IMPLANTS: None  DICTATION: .Dragon Dictation patient brought the operating room and after adequate anesthesia was obtained the patient was placed in lateral decubitus position. The right hip was prepped and draped in a sterile fashion. Appropriate patient education timeout procedures were completed. The wound was examined and cultures obtained 2. On examination of the wound there was a metallic plate hook plate for of the trochanter that did not have any bone attached more proximally. This bone underneath this plate appeared devascularized. There were several loose bone fragments present within the subcutaneous wound. These were debrided and sent as specimen. The hip wound was then irrigated with pulsatile lavage and closed over drains with #2 PDS suture. The hip joint could be visualized and was stable with range of motion but the prior greater trochanter fracture appeared to have migrated proximally and the lateral deep tissues were not repairable  PLAN OF CARE: Continue inpatient  PATIENT DISPOSITION:  PACU - hemodynamically stable.

## 2015-06-19 NOTE — Clinical Social Work Note (Addendum)
CSW notified [facility] that pt would DC to different facility to continue her rehab.  Per [facility] (the new facility) supplemental policy should cover co-pay.  OOP $3000, and Ded- $300 which was already met at [facility].  CSW has given pt's daughter in law the contact information for [facility] for further facility questions.   Contact CSW spoke with for insurance is [place]ick[place] daughter in [phone].

## 2015-06-19 NOTE — Transfer of Care (Signed)
Immediate Anesthesia Transfer of Care Note  Patient: Sabrina Duncan  Procedure(s) Performed: Procedure(s) with comments: IRRIGATION AND [place] (Right) - hemovac placement  Patient Location: PACU  Anesthesia Type:General  Level of Consciousness: awake  Airway & Oxygen Therapy: Patient Spontanous Breathing and Patient connected to face mask oxygen  Post-op Assessment: Report given to RN and Post -op Vital signs reviewed and stable  Post vital signs: stable  Last Vitals:  Filed Vitals:   06/19/15 1708  BP: 123/66  Pulse: 83  Temp: 36.7 C  Resp: 22    Complications: No apparent anesthesia complications

## 2015-06-19 NOTE — Anesthesia Preprocedure Evaluation (Signed)
Anesthesia Evaluation  Patient identified by MRN, date of birth, ID band Patient awake and Patient confused    Reviewed: Allergy & Precautions, NPO status , Patient's Chart, lab work & pertinent test results, reviewed documented beta blocker date and time   History of Anesthesia Complications Negative for: history of anesthetic complications  Airway Mallampati: III  TM Distance: >3 FB Neck ROM: Full  Mouth opening: Limited Mouth Opening  Dental  (+) Upper Dentures   Pulmonary neg pulmonary ROS, former smoker,  breath sounds clear to auscultation  Pulmonary exam normal       Cardiovascular Exercise Tolerance: Poor hypertension, Pt. on medications + CAD and + Peripheral Vascular Disease Normal cardiovascular examRhythm:Regular Rate:Normal  Thoracic ascending aortic aneurysm   Neuro/Psych Depression Parkinson's disease    GI/Hepatic Neg liver ROS, GERD-  Medicated and Controlled,  Endo/Other  negative endocrine ROS  Renal/GU negative Renal ROS  negative genitourinary   Musculoskeletal  (+) Arthritis -, Osteoarthritis,    Abdominal   Peds negative pediatric ROS (+)  Hematology  (+) anemia ,   Anesthesia Other Findings   Reproductive/Obstetrics negative OB ROS                             Anesthesia Physical Anesthesia Plan  ASA: IV  Anesthesia Plan: General   Post-op Pain Management:    Induction: Intravenous  Airway Management Planned: Oral ETT  Additional Equipment:   Intra-op Plan:   Post-operative Plan: Extubation in OR  Informed Consent: I have reviewed the patients History and Physical, chart, labs and discussed the procedure including the risks, benefits and alternatives for the proposed anesthesia with the patient or authorized representative who has indicated his/her understanding and acceptance.   Dental advisory given  Plan Discussed with: CRNA and  Surgeon  Anesthesia Plan Comments:         Anesthesia Quick Evaluation

## 2015-06-20 ENCOUNTER — Ambulatory Visit: Payer: Medicare Other | Admitting: Hematology and Oncology

## 2015-06-20 ENCOUNTER — Other Ambulatory Visit: Payer: Medicare Other

## 2015-06-20 ENCOUNTER — Encounter: Payer: Self-pay | Admitting: Orthopedic Surgery

## 2015-06-20 LAB — CBC
HCT: 27.1 % — ABNORMAL LOW (ref 35.0–47.0)
Hemoglobin: 9.1 g/dL — ABNORMAL LOW (ref 12.0–16.0)
MCH: 29.9 pg (ref 26.0–34.0)
MCHC: 33.6 g/dL (ref 32.0–36.0)
MCV: 89 fL (ref 80.0–100.0)
Platelets: 378 10*3/uL (ref 150–440)
RBC: 3.05 MIL/uL — AB (ref 3.80–5.20)
RDW: 18.6 % — ABNORMAL HIGH (ref 11.5–14.5)
WBC: 6.2 10*3/uL (ref 3.6–11.0)

## 2015-06-20 LAB — TYPE AND SCREEN
ABO/RH(D): O POS
ANTIBODY SCREEN: NEGATIVE
UNIT DIVISION: 0
UNIT DIVISION: 0
Unit division: 0

## 2015-06-20 MED ORDER — ENSURE ENLIVE PO LIQD
237.0000 mL | Freq: Three times a day (TID) | ORAL | Status: DC
  Administered 2015-06-20 – 2015-06-24 (×10): 237 mL via ORAL

## 2015-06-20 MED ORDER — BETHANECHOL CHLORIDE 25 MG PO TABS
25.0000 mg | ORAL_TABLET | Freq: Three times a day (TID) | ORAL | Status: DC
  Administered 2015-06-20 (×2): 25 mg via ORAL
  Filled 2015-06-20 (×5): qty 1

## 2015-06-20 NOTE — Evaluation (Signed)
Physical Therapy Evaluation Patient Details Name: Sabrina Duncan MRN: [id number] DOB: 04/14/1941 Today's Date: 06/20/2015   History of Present Illness  74 yo female with onset of bleeding R hip after THA at another hospital in April 2016 per pt.  Received irrigation and debridement for R leg hematoma, with recent admit as well for same issue.  Transfused for anemia, UTI and history polymyalgia rheumatica.  Clinical Impression  Pt was seen for evaluation of her limitations after irrigation of R hip and transfusion.  Her level of gait requires her to get hands on assist for steps as she crosses over and does not have her AFO.  Will anticipate return to SNF when medically stable.    Follow Up Recommendations SNF    Equipment Recommendations  None recommended by PT    Recommendations for Other Services       Precautions / Restrictions Precautions Precautions: Fall (telemetry) Required Braces or Orthoses: Other Brace/Splint (Owns an AFO for R leg foot drop but not with her) Restrictions Weight Bearing Restrictions: No      Mobility  Bed Mobility Overal bed mobility: Needs Assistance Bed Mobility: Supine to Sit     Supine to sit: Max assist     General bed mobility comments: assisted to sit up under trunk and usign bed pad to slide hips  Transfers Overall transfer level: Needs assistance Equipment used: Rolling walker (2 wheeled);2 person hand held assist Transfers: Sit to/from [facility] Sit to Stand: +2 physical assistance;+2 safety/equipment;Max assist Stand pivot transfers: Max assist;+2 physical assistance;+2 safety/equipment       General transfer comment: Pt cannot initiate and needs total cues for placement of hands and to transition from bed to walker with hands  Ambulation/Gait Ambulation/Gait assistance: Mod assist;+2 physical assistance;+2 safety/equipment Ambulation Distance (Feet): 4 Feet Assistive device: Rolling walker (2  wheeled);2 person hand held assist   Gait velocity: slow, halting Gait velocity interpretation: Below normal speed for age/gender General Gait Details: sidesteps with crossover, had to be assisted to uncross and cannot sequence without both verbal and tactile cues  Stairs            Wheelchair Mobility    Modified Rankin (Stroke Patients Only)       Balance Overall balance assessment: Needs assistance Sitting-balance support: Feet supported Sitting balance-Leahy Scale: Poor     Standing balance support: Bilateral upper extremity supported Standing balance-Leahy Scale: Poor                               Pertinent Vitals/Pain Pain Assessment: Faces Pain Score: 4  Faces Pain Scale: Hurts little more Pain Location: R hip Pain Intervention(s): Limited activity within patient's tolerance;Monitored during session;Premedicated before session;Repositioned    Home Living Family/patient expects to be discharged to:: Skilled nursing facility                      Prior Function Level of Independence: Needs assistance   Gait / Transfers Assistance Needed: Ambulates throughout room environment, to/from dining hall (for all meals) with RW, R AFO, mod indep  ADL's / Homemaking Assistance Needed: Assist from staff for all ADLs        Hand Dominance        Extremity/Trunk Assessment   Upper Extremity Assessment: Generalized weakness           Lower Extremity Assessment: Generalized weakness      Cervical / Trunk  Assessment: Kyphotic  Communication   Communication: Other (comment) (confusion but speech is clear)  Cognition Arousal/Alertness: Lethargic Behavior During Therapy: Flat affect Overall Cognitive Status: History of cognitive impairments - at baseline       Memory: Decreased recall of precautions;Decreased short-term memory              General Comments General comments (skin integrity, edema, etc.): Pt was able to tolerate  the transfer to chair but did not do well with steps.  Will need PT to continue and for PT at SNF to restore her apparent prior level of gait on a walker    Exercises Other Exercises Other Exercises: DF stretches manually for tone and RLE foot drop      Assessment/Plan    PT Assessment Patient needs continued PT services  PT Diagnosis Difficulty walking;Generalized weakness   PT Problem List Decreased strength;Decreased range of motion;Decreased activity tolerance;Decreased balance;Decreased mobility;Decreased coordination;Decreased cognition;Decreased knowledge of use of DME;Decreased safety awareness;Decreased knowledge of precautions;Cardiopulmonary status limiting activity;Decreased skin integrity;Pain  PT Treatment Interventions DME instruction;Gait training;Functional mobility training;Therapeutic activities;Therapeutic exercise;Balance training;Neuromuscular re-education;Cognitive remediation;Patient/family education   PT Goals (Current goals can be found in the Care Plan section) Acute Rehab PT Goals Patient Stated Goal: none stated PT Goal Formulation: Patient unable to participate in goal setting Time For Goal Achievement: 07/04/15 Potential to Achieve Goals: Good    Frequency Min 2X/week   Barriers to discharge   going to SNF    Co-evaluation               End of Session Equipment Utilized During Treatment: Gait belt Activity Tolerance: Patient tolerated treatment well;Patient limited by pain;Patient limited by fatigue Patient left: in chair;with call bell/phone within reach;with nursing/sitter in room Nurse Communication: Mobility status         Time: 1008-1040 PT Time Calculation (min) (ACUTE ONLY): 32 min   Charges:   PT Evaluation $Initial PT Evaluation Tier I: 1 Procedure PT Treatments $Gait Training: 8-22 mins   PT G Codes:        Ramond Dial July 06, 2015, 11:48 AM   Mee Hives, PT MS Acute Rehab Dept. Number: [facility] [id number] and [place]  [phone]

## 2015-06-20 NOTE — Care Management (Signed)
Important Message  Patient Details  Name: Sabrina Duncan MRN: [id number] Date of Birth: 1941/09/27   Medicare Important Message Given:  Yes-third notification given    Darius Bump Allmond 06/20/2015, 12:17 PM

## 2015-06-20 NOTE — Clinical Social Work Note (Signed)
CSW spoke to Jorje Guild, Nurse Liason with [facility].  She extended bed offers from - [facility] and [facility].  Pt also has an offer from - [facility].  CSW called daughter-in-law and left a voicemail message.  CSW will continue to follow and assist with discharge planning needs.  [place], [place]

## 2015-06-20 NOTE — Progress Notes (Signed)
Patient ID: Sabrina Duncan, female   DOB: 24-Sep-1941, 74 y.o.   MRN: [id number] [facility] PROGRESS NOTE  PCP: Adrian Prows.  HPI/Subjective: Patient feels okay. Having some difficulty with urination and needed to be straight cathetered last night. Appetite okay. Hemovac on right hip.  Objective: Filed Vitals:   06/20/15 0758  BP: 119/72  Pulse: 120  Temp: 98.9 F (37.2 C)  Resp: 20    Intake/Output Summary (Last 24 hours) at 06/20/15 1431 Last data filed at 06/20/15 1310  Gross per 24 hour  Intake   2797 ml  Output    775 ml  Net   2022 ml   Filed Weights   06/18/15 0429 06/19/15 0616 06/20/15 0459  Weight: 44.589 kg (98 lb 4.8 oz) 46.267 kg (102 lb) 48.988 kg (108 lb)    ROS: Review of Systems  Constitutional: Negative for fever and chills.  Eyes: Negative for blurred vision.  Respiratory: Negative for cough and shortness of breath.   Cardiovascular: Negative for chest pain.  Gastrointestinal: Negative for nausea, vomiting, abdominal pain, diarrhea and constipation.  Genitourinary: Negative for dysuria.  Musculoskeletal: Negative for joint pain.  Neurological: Negative for dizziness and headaches.   Exam: Physical Exam  HENT:  Head: Normocephalic and atraumatic.  Right Ear: No drainage.  Left Ear: No drainage.  Nose: No mucosal edema.  Mouth/Throat: No oropharyngeal exudate or posterior oropharyngeal edema.  Eyes: Conjunctivae, EOM and lids are normal. Pupils are equal, round, and reactive to light.  Neck: No JVD present. Carotid bruit is not present. No edema present. No thyroid mass and no thyromegaly present.  Cardiovascular: S1 normal and S2 normal.  Exam reveals no gallop.   Murmur heard.  Systolic murmur is present with a grade of 2/6  Pulses:      Dorsalis pedis pulses are 2+ on the right side, and 2+ on the left side.  Respiratory: No respiratory distress. She has no wheezes. She has no rhonchi. She has no rales.  GI:  Soft. Bowel sounds are normal. There is no tenderness.  Musculoskeletal:       Right ankle: She exhibits no swelling.       Left ankle: She exhibits no swelling.  Lymphadenopathy:    She has no cervical adenopathy.  Neurological: She is alert. No cranial nerve deficit.  Skin: Skin is warm. No rash noted. Nails show no clubbing.  Large pressure dressing over right hip Hemovac drain present.  Psychiatric: She has a normal mood and affect.    Data Reviewed: Basic Metabolic Panel:  Recent Labs Lab 06/17/15 0121 06/18/15 0442 06/19/15 0442  NA 128* 135 137  K 4.4 4.0 3.9  CL 94* 105 108  CO2 26 23 26   GLUCOSE 90 85 89  BUN 19 12 8   CREATININE 0.56 0.51 0.36*  CALCIUM 7.8* 7.5* 7.5*  MG  --  1.7 2.1   Liver Function Tests:  Recent Labs Lab 06/17/15 0121  AST 12*  ALT <5*  ALKPHOS 107  BILITOT 0.5  PROT 5.3*  ALBUMIN 2.0*   CBC:  Recent Labs Lab 06/17/15 0121 06/17/15 1309 06/18/15 0442 06/19/15 0442 06/20/15 0438  WBC 4.6 4.5 5.3 3.8 6.2  HGB 7.2* 9.0* 7.8* 8.2* 9.1*  HCT 22.0* 27.0* 23.3* 25.2* 27.1*  MCV 85.6 84.6 86.0 86.7 89.0  PLT 567* 517* 415 351 378    Recent Results (from the past 240 hour(s))  Urine culture     Status: None (Preliminary result)  Collection Time: 06/18/15  1:39 PM  Result Value Ref Range Status   Specimen Description URINE, RANDOM  Final   Special Requests NONE  Final   Culture TOO YOUNG TO READ  Final   Report Status PENDING  Incomplete  Urine culture     Status: None (Preliminary result)   Collection Time: 06/19/15  6:46 PM  Result Value Ref Range Status   Specimen Description URINE, CLEAN CATCH  Final   Special Requests NONE  Final   Culture NO GROWTH <24 HRS  Final   Report Status PENDING  Incomplete     Studies: Dg Hip Unilat With Pelvis 2-3 Views Right  06/19/2015   CLINICAL DATA:  Right hip pain.  EXAM: RIGHT HIP (WITH PELVIS) 2-3 VIEWS  COMPARISON:  Radiographs dated 05/12/2015 and CT scan dated 05/09/2015   FINDINGS: There is dystrophic calcification in the soft tissues at the site of the wall fracture of the medial wall of the right acetabulum. There is minimal displacement at that site. Small displaced bone fragments are seen in the soft tissues above the right hip but these appear less prominent than on the prior study. Right hip hardware appears unchanged. Old deformities of the pubic rami bilaterally.  No appreciable significant change in the appearance of the right hip since the radiographs of 05/12/2015.  IMPRESSION: Essentially stable appearance of the right hip since 05/12/2015.   Electronically Signed   By: Lorriane Shire M.D.   On: 06/19/2015 18:56    Scheduled Meds: . ARIPiprazole  5 mg Oral Daily  . atorvastatin  20 mg Oral QHS  . buPROPion  100 mg Oral Daily  . carbidopa-levodopa  2 tablet Oral QID  . carbidopa-levodopa  2 tablet Oral QHS   And  . entacapone  200 mg Oral QHS  . cefTRIAXone (ROCEPHIN)  IV  1 g Intravenous Q24H  . cholecalciferol  2,000 Units Oral Daily  . clindamycin  300 mg Oral BID  . collagenase   Topical Daily  . docusate sodium  100 mg Oral BID  . feeding supplement (ENSURE ENLIVE)  237 mL Oral TID WC  . feeding supplement (PRO-STAT SUGAR FREE 64)  30 mL Oral BID BM  . ferrous fumarate  1 tablet Oral BID  . QUEtiapine  50 mg Oral Daily  . QUEtiapine  75 mg Oral QHS  . rifampin  300 mg Oral BID  . rOPINIRole  2 mg Oral TID  . sodium chloride  3 mL Intravenous Q12H  . venlafaxine XR  150 mg Oral Q breakfast  . vitamin B-12  1,000 mcg Oral Daily   Continuous Infusions: . sodium chloride 75 mL/hr at 06/20/15 0330    Assessment/Plan:  1. Acute post hemorrhagic anemia- likely from hematoma right hip. Patient has a Hemovac. Patient's hemoglobin is higher today than yesterday. Patient is on ferrous fumarate. 2. Urinary retention- patient had a straight catheter last night. 510 cc on bladder scan now. I will order Urecholine to try to stimulate her to go. When  necessary straight catheters. 3. Parkinson's disease- continue usual medications. 4. Patient is on empiric Rocephin just in case infection on the hip. 5. Hyperlipidemia unspecified continue atorvastatin. 6. Depression and anxiety continue psychiatric medications. 7. PT recommended rehabilitation.  Code Status:     Code Status Orders        Start     Ordered   06/17/15 0758  Full code   Continuous     06/17/15 0758  Advance Directive Documentation        Most Recent Value   Type of Advance Directive  Living will   Pre-existing out of facility DNR order (yellow form or pink MOST form)     "MOST" Form in Place?       Disposition Plan: Rehabilitation  Consultants:  Orthopedic surgery  Procedures:  Evacuation of hematoma right hip  Antibiotics:  IV Rocephin  Time spent: 25 minutes  Loletha Grayer  [facility] Hospitalists

## 2015-06-20 NOTE — Progress Notes (Signed)
Nutrition Follow-up  DOCUMENTATION CODES:  Severe malnutrition in context of acute illness/injury  INTERVENTION:   (Nutrition Supplement Therapy: ) Recommend ensure enlive po TID for added nutrition (each supplement provides 350 kcals and 20 grams of protein) RN, Annabella requesting ensure to be sent at meal times.  Meals and snacks: Cater to pt prefernces  NUTRITION DIAGNOSIS:  Inadequate oral intake related to acute illness as evidenced by percent weight loss.   GOAL:  Patient will meet greater than or equal to 90% of their needs    MONITOR:   (Energy intake, Anthropometrics)  REASON FOR ASSESSMENT:  Malnutrition Screening Tool    ASSESSMENT:  Pt s/p I and D of right hip wound and hemovac placed  Diet Order: regular, ensure enlive BID with prostat BID  Current Nutrition: eating 36% of meals per I and O sheet on average.  Ate cornflakes, few bites of blueberry muffin, drank some ensure per CNA. No lunch eaten yet, pt sleeping  Food/Nutrition History: pt reports was not eating well prior to admission for the last 2 weeks  Last BM: 6/29   Medications: NS at 52ml/hr  Electrolyte/Renal Profile and Glucose Profile:   Recent Labs Lab 06/17/15 0121 06/18/15 0442 06/19/15 0442  NA 128* 135 137  K 4.4 4.0 3.9  CL 94* 105 108  CO2 26 23 26   BUN 19 12 8   CREATININE 0.56 0.51 0.36*  CALCIUM 7.8* 7.5* 7.5*  MG  --  1.7 2.1  GLUCOSE 90 85 89   Protein Profile:   Recent Labs Lab 06/17/15 0121  ALBUMIN 2.0*     Weight Trend since Admission: Filed Weights   06/18/15 0429 06/19/15 0616 06/20/15 0459  Weight: 98 lb 4.8 oz (44.589 kg) 102 lb (46.267 kg) 108 lb (48.988 kg)      Height:  Ht Readings from Last 1 Encounters:  06/17/15 5\' 6"  (1.676 m)    Weight:  Wt Readings from Last 1 Encounters:  06/20/15 108 lb (48.988 kg)     Wt Readings from Last 10 Encounters:  06/20/15 108 lb (48.988 kg)  06/13/15 122 lb (55.339 kg)  05/28/15 127 lb  3.2 oz (57.698 kg)  05/09/15 138 lb (62.596 kg)  10/02/14 143 lb 8 oz (65.091 kg)  02/13/14 149 lb 12 oz (67.926 kg)  09/26/13 154 lb (69.854 kg)  09/22/13 154 lb 8 oz (70.081 kg)  09/21/13 155 lb (70.308 kg)  08/24/13 155 lb (70.308 kg)    BMI:  Body mass index is 17.44 kg/(m^2).  Estimated Nutritional Needs:  Kcal:  Using IBW of 59kg (BEE 1116 kcals (IF 1.0-1.2, AF 1.2) 3267-1245 kcals/d.   Protein:  Using IBW of 59kg (1.2-1.5 g/kg) 71-89 g/d  Fluid:  Using IBW of 59kg (25-48ml/kg) 1475-1731ml/d  Skin:   reviewed  Diet Order:  Diet regular Room service appropriate?: Yes; Fluid consistency:: Thin  EDUCATION NEEDS:  No education needs identified at this time    [place]. Zenia Resides, [place], [place] (pager)

## 2015-06-20 NOTE — Care Management (Signed)
CSW following for SNF placement.  

## 2015-06-20 NOTE — Progress Notes (Signed)
Hemovac functioning.  N/v intact. Culture pending from surgery. Start PT

## 2015-06-20 NOTE — Progress Notes (Signed)
Patient was unable to urinate two hours post medication administration. Attending MD ordered  in and out cath once.

## 2015-06-20 NOTE — Progress Notes (Signed)
Dr. Darvin Neighbours notified of pt being unable to void since early this afternoon.  Bladder scan >64mL.  Pt attempted to use bedpan on own but no success.  MD ordered in and out cath x1.

## 2015-06-21 LAB — SURGICAL PATHOLOGY

## 2015-06-21 LAB — URINE CULTURE: Culture: NO GROWTH

## 2015-06-21 LAB — CBC
HCT: 26.3 % — ABNORMAL LOW (ref 35.0–47.0)
Hemoglobin: 8.9 g/dL — ABNORMAL LOW (ref 12.0–16.0)
MCH: 29.4 pg (ref 26.0–34.0)
MCHC: 33.7 g/dL (ref 32.0–36.0)
MCV: 87.1 fL (ref 80.0–100.0)
Platelets: 322 10*3/uL (ref 150–440)
RBC: 3.02 MIL/uL — ABNORMAL LOW (ref 3.80–5.20)
RDW: 18.7 % — ABNORMAL HIGH (ref 11.5–14.5)
WBC: 6.4 10*3/uL (ref 3.6–11.0)

## 2015-06-21 MED ORDER — METRONIDAZOLE 500 MG PO TABS
250.0000 mg | ORAL_TABLET | Freq: Three times a day (TID) | ORAL | Status: DC
  Administered 2015-06-21 – 2015-06-24 (×9): 250 mg via ORAL
  Filled 2015-06-21 (×10): qty 1

## 2015-06-21 NOTE — Clinical Social Work Note (Signed)
CSW spoke with Garry Heater at [phone] this morning. Mrs. Merchel stated that patient might not need a wound vac and if not, would that open her options. She also asked if [facility] could still be an option if the family considered hiring sitters for a couple of weeks. The family prefers [place] or [place] [facility] if possible for STR. CSW has requested both look at patient's referral again. CSW also clarified that patient does not currently have a wound vac but rather has a hemovac which is expected to be discontinued prior to discharge. CSW has also contacted Hazel at [facility] and she stated that they could consider moving patient to their pathways wing which could provide a higher level of care and have home health come to do PT and RN for dressing changes. Onalee Hua stated she would have to come and evaluate patient when closer to time of discharge.  Shela Leff MSW,LCSWA [phone]

## 2015-06-21 NOTE — Progress Notes (Signed)
Physical Therapy Treatment Patient Details Name: Sabrina Duncan MRN: [id number] DOB: 08-31-1941 Today's Date: 06/21/2015    History of Present Illness 74 yo female with onset of bleeding R hip after THA at another hospital in April 2016 per pt.  Received irrigation and debridement for R leg hematoma, with recent admit as well for same issue.  Transfused for anemia, UTI and history polymyalgia rheumatica.    PT Comments    Pt was able to assist more with transfers today and made use of sitter so she could instruct the followup crew on the next shift.  She was able to tolerate sitting with close S and could help with standing once PT initiated.  Her ability to initiate is poor but with dense cues can follow along to pivot and to get back onto bed with mod assist.  Follow Up Recommendations  SNF     Equipment Recommendations  None recommended by PT    Recommendations for Other Services       Precautions / Restrictions Precautions Precautions: Fall (Telemetry) Required Braces or Orthoses: Other Brace/Splint Other Brace/Splint: Has AFO at home Restrictions Weight Bearing Restrictions: No    Mobility  Bed Mobility Overal bed mobility: Needs Assistance Bed Mobility: Supine to Sit;Sit to Supine     Supine to sit: Mod assist Sit to supine: Mod assist   General bed mobility comments: assisted to sit up under trunk and usign bed pad to slide hips  Transfers Overall transfer level: Needs assistance Equipment used: 2 person hand held assist Transfers: Sit to/from [employer] Transfers Sit to Stand: Max assist Stand pivot transfers: Max assist;From elevated surface (has some assist from second person)       General transfer comment: Cannot initiate and tends to tense,   Ambulation/Gait             General Gait Details: did not attempt   Stairs            Wheelchair Mobility    Modified Rankin (Stroke Patients Only)       Balance Overall  balance assessment: Needs assistance         Standing balance support: Bilateral upper extremity supported Standing balance-Leahy Scale: Poor                      Cognition Arousal/Alertness: Lethargic Behavior During Therapy: Flat affect Overall Cognitive Status: History of cognitive impairments - at baseline       Memory: Decreased recall of precautions;Decreased short-term memory              Exercises Other Exercises Other Exercises: DF stretches manually for tone and RLE foot drop    General Comments General comments (skin integrity, edema, etc.): Pt wanted to try [facility] and had her sitter stand by to instruct for next shift, to teach the pivot transfer      Pertinent Vitals/Pain Pain Assessment: Faces Pain Score: 4  Pain Location: R hip Pain Intervention(s): Limited activity within patient's tolerance;Monitored during session;Premedicated before session;Repositioned    Home Living                      Prior Function            PT Goals (current goals can now be found in the care plan section) Acute Rehab PT Goals Patient Stated Goal: to get onto [facility] Progress towards PT goals: Progressing toward goals    Frequency  Min 2X/week  PT Plan Current plan remains appropriate    Co-evaluation             End of Session Equipment Utilized During Treatment: Gait belt Activity Tolerance: Patient tolerated treatment well;Patient limited by pain;Patient limited by fatigue Patient left: in bed;with call bell/phone within reach;with nursing/sitter in room     Time: 1502-1529 PT Time Calculation (min) (ACUTE ONLY): 27 min  Charges:  $Therapeutic Activity: 23-37 mins                    G Codes:      Ramond Dial 2015/07/06, 5:09 PM   Mee Hives, PT MS Acute Rehab Dept. Number: [facility] [id number] and [place] [phone]

## 2015-06-21 NOTE — Progress Notes (Signed)
Patient ID: Sabrina Duncan, female   DOB: [birth year], 74 y.o.   MRN: [id number] [facility] PROGRESS NOTE  PCP: Adrian Prows.  HPI/Subjective: Patient is very upset this morning. Concerned that she had a fever yesterday. States that one thing is happening after another. She is also confused. She thinks that she is an employee here. I had to try to re-orient her. She was able to urinate yesterday on the commode.  Objective: Filed Vitals:   06/21/15 0810  BP: 139/76  Pulse: 126  Temp: 97.9 F (36.6 C)  Resp: 19    Intake/Output Summary (Last 24 hours) at 06/21/15 0828 Last data filed at 06/21/15 0813  Gross per 24 hour  Intake   1739 ml  Output   1220 ml  Net    519 ml   Filed Weights   06/19/15 0616 06/20/15 0459 06/21/15 0455  Weight: 46.267 kg (102 lb) 48.988 kg (108 lb) 47.265 kg (104 lb 3.2 oz)    ROS: Review of Systems  Constitutional: Positive for fever. Negative for chills.  Eyes: Negative for blurred vision.  Respiratory: Negative for cough and shortness of breath.   Cardiovascular: Negative for chest pain.  Gastrointestinal: Negative for nausea, vomiting, abdominal pain, diarrhea and constipation.  Genitourinary: Negative for dysuria.  Musculoskeletal: Negative for joint pain.  Neurological: Negative for dizziness and headaches.   Exam: Physical Exam  HENT:  Head: Normocephalic and atraumatic.  Right Ear: No drainage.  Left Ear: No drainage.  Nose: No mucosal edema.  Mouth/Throat: No oropharyngeal exudate or posterior oropharyngeal edema.  Eyes: Conjunctivae, EOM and lids are normal. Pupils are equal, round, and reactive to light.  Neck: No JVD present. Carotid bruit is not present. No edema present. No thyroid mass and no thyromegaly present.  Cardiovascular: S1 normal and S2 normal.  Exam reveals no gallop.   Murmur heard.  Systolic murmur is present with a grade of 2/6  Pulses:      Dorsalis pedis pulses are 2+ on the right  side, and 2+ on the left side.  Respiratory: No respiratory distress. She has no wheezes. She has no rhonchi. She has no rales.  GI: Soft. Bowel sounds are normal. There is no tenderness.  Musculoskeletal:       Right ankle: She exhibits no swelling.       Left ankle: She exhibits no swelling.  Lymphadenopathy:    She has no cervical adenopathy.  Neurological: She is alert. No cranial nerve deficit.  History of right ankle weakness and wears a brace. Patient is confused this morning and think she is an employee here.  Skin: Skin is warm. No rash noted. Nails show no clubbing.  Large pressure dressing over right hip Hemovac drain present.  Psychiatric: Her mood appears anxious.    Data Reviewed: Basic Metabolic Panel:  Recent Labs Lab 06/17/15 0121 06/18/15 0442 06/19/15 0442  NA 128* 135 137  K 4.4 4.0 3.9  CL 94* 105 108  CO2 26 23 26   GLUCOSE 90 85 89  BUN 19 12 8   CREATININE 0.56 0.51 0.36*  CALCIUM 7.8* 7.5* 7.5*  MG  --  1.7 2.1   Liver Function Tests:  Recent Labs Lab 06/17/15 0121  AST 12*  ALT <5*  ALKPHOS 107  BILITOT 0.5  PROT 5.3*  ALBUMIN 2.0*   CBC:  Recent Labs Lab 06/17/15 1309 06/18/15 0442 06/19/15 0442 06/20/15 0438 06/21/15 0715  WBC 4.5 5.3 3.8 6.2 6.4  HGB 9.0* 7.8*  8.2* 9.1* 8.9*  HCT 27.0* 23.3* 25.2* 27.1* 26.3*  MCV 84.6 86.0 86.7 89.0 87.1  PLT 517* 415 351 378 322    Recent Results (from the past 240 hour(s))  Urine culture     Status: None (Preliminary result)   Collection Time: 06/18/15  1:39 PM  Result Value Ref Range Status   Specimen Description URINE, RANDOM  Final   Special Requests NONE  Final   Culture TOO YOUNG TO READ  Final   Report Status PENDING  Incomplete  Urine culture     Status: None (Preliminary result)   Collection Time: 06/19/15  6:46 PM  Result Value Ref Range Status   Specimen Description URINE, CLEAN CATCH  Final   Special Requests NONE  Final   Culture NO GROWTH <24 HRS  Final   Report  Status PENDING  Incomplete     Studies: Dg Hip Unilat With Pelvis 2-3 Views Right  06/19/2015   CLINICAL DATA:  Right hip pain.  EXAM: RIGHT HIP (WITH PELVIS) 2-3 VIEWS  COMPARISON:  Radiographs dated 05/12/2015 and CT scan dated 05/09/2015  FINDINGS: There is dystrophic calcification in the soft tissues at the site of the wall fracture of the medial wall of the right acetabulum. There is minimal displacement at that site. Small displaced bone fragments are seen in the soft tissues above the right hip but these appear less prominent than on the prior study. Right hip hardware appears unchanged. Old deformities of the pubic rami bilaterally.  No appreciable significant change in the appearance of the right hip since the radiographs of 05/12/2015.  IMPRESSION: Essentially stable appearance of the right hip since 05/12/2015.   Electronically Signed   By: Lorriane Shire M.D.   On: 06/19/2015 18:56    Scheduled Meds: . ARIPiprazole  5 mg Oral Daily  . atorvastatin  20 mg Oral QHS  . bethanechol  25 mg Oral TID  . buPROPion  100 mg Oral Daily  . carbidopa-levodopa  2 tablet Oral QID  . carbidopa-levodopa  2 tablet Oral QHS   And  . entacapone  200 mg Oral QHS  . cefTRIAXone (ROCEPHIN)  IV  1 g Intravenous Q24H  . cholecalciferol  2,000 Units Oral Daily  . clindamycin  300 mg Oral BID  . collagenase   Topical Daily  . docusate sodium  100 mg Oral BID  . feeding supplement (ENSURE ENLIVE)  237 mL Oral TID WC  . feeding supplement (PRO-STAT SUGAR FREE 64)  30 mL Oral BID BM  . ferrous fumarate  1 tablet Oral BID  . QUEtiapine  50 mg Oral Daily  . QUEtiapine  75 mg Oral QHS  . rifampin  300 mg Oral BID  . rOPINIRole  2 mg Oral TID  . sodium chloride  3 mL Intravenous Q12H  . venlafaxine XR  150 mg Oral Q breakfast  . vitamin B-12  1,000 mcg Oral Daily   Continuous Infusions:    Assessment/Plan:  1. Acute post hemorrhagic anemia- likely from hematoma right hip. Patient has a Hemovac.  Patient's hemoglobin is stable. Patient is on ferrous fumarate. 2. Acute delirium- try to hold off on pain medications. DC Urecholine. Continue her usual medications for anxiety and depression and Parkinson's 3. Urinary retention- patient able to go to the bathroom on the commode 4. Parkinson's disease- continue usual medications. 5. Patient is on empiric Rocephin just in case infection on the hip. Also on clindamycin and rifampin from previous admission. 6. Hyperlipidemia  unspecified continue atorvastatin. 7. Depression and anxiety continue psychiatric medications. 8. PT recommended rehabilitation.  Code Status:     Code Status Orders        Start     Ordered   06/17/15 0758  Full code   Continuous     06/17/15 0758    Advance Directive Documentation        Most Recent Value   Type of Advance Directive  Living will   Pre-existing out of facility DNR order (yellow form or pink MOST form)     "MOST" Form in Place?       Family communication: Left message for son. Disposition Plan: Rehabilitation  Consultants:  Orthopedic surgery  Procedures:  Evacuation of hematoma right hip  Antibiotics:  IV Rocephin, by mouth clindamycin and rifampin.  Time spent: 25 minutes  Loletha Grayer  [facility]

## 2015-06-22 DIAGNOSIS — G20 Parkinson's disease: Secondary | ICD-10-CM

## 2015-06-22 MED ORDER — QUETIAPINE FUMARATE 25 MG PO TABS
75.0000 mg | ORAL_TABLET | Freq: Every day | ORAL | Status: DC
  Administered 2015-06-22 – 2015-06-23 (×2): 75 mg via ORAL
  Filled 2015-06-22 (×2): qty 3

## 2015-06-22 NOTE — Clinical Social Work Note (Signed)
Hazel with [facility] came by this afternoon and she states that they will take patient back. CSW is to call Onalee Hua on her direct cell: [phone] at time of discharge. Due to staffing issues at [facility], patient will not be able to discharge until Sunday. Dr. Leslye Peer has changed the dressing changes to every 48 hours rather than daily. When patient discharges Sunday, dressing will need to be changed prior to discharge and then [facility] will change dressings beginning on Tuesday. CSW contacted patient's daughter in law: Ms. Unk Lightning: [phone] and updated her and she was very happy that patient could return to [facility]. Ms. Unk Lightning is hiring a sitter to be with patient at [facility] for a few weeks to help her. Ms, Unk Lightning would like to be called at time of discharge. Shela Leff MSW,LCSWA [phone]

## 2015-06-22 NOTE — Consult Note (Signed)
CC: Parkinsons   HPI: Sabrina Duncan is an 74 y.o. female presents complaining of soreness in her right hip. She's had a hip replacement and multiple revisions due to bleeding hematoma at the surgical site. She's been informed that she is anemic and presents to the hospital for transfusion and further surgical repair. Pt is s/p irrigation and debridement of R hip.  Pt has hx of Parkinson's disease.  Pt is on comptan and sinemet.   PD for past 5 yrs. Pt does not think that symptoms have worsened while in hospital.    Past Medical History  Diagnosis Date  . Depressive disorder, not elsewhere classified   . Unspecified osteomyelitis, site unspecified   . Polymyalgia rheumatica   . Paralysis agitans   . Osteoarthrosis, unspecified whether generalized or localized, unspecified site   . Muscle weakness (generalized)   . Difficulty in walking(719.7)   . Parkinson disease   . PMR (polymyalgia rheumatica)   . Coronary atherosclerosis of unspecified type of vessel, native or graft     2007: LAD PCI with a BMS, 80% distal LCX stenosis treated medically. Most recent stress test in 04/2012 showed distal anterio/apical scar? with no ischemia, EF 53%.   [name] Unspecified essential hypertension   . Other and unspecified hyperlipidemia   . Thoracic ascending aortic aneurysm     4.0 cm  . Complication of anesthesia     2013- hallucinations- [facility]. (-spinal anesth. 2013- no problems   redo- hip- L)  . Esophageal reflux     pt. reports that its resolved   . Neuromuscular disorder     parkinson's - [facility] - neurologist   . Cancer     left breast cancer- lumpectomy, chemo, radiation, tamoxifen , SLNBx  . Anemia, unspecified     bld. transfusion- 02/2013    Past Surgical History  Procedure Laterality Date  . Knee surgery Left   . Varicose vein surgery      both legs   . Melanoma excision Right     elbow  . Knee surgery Left   . Hip replacement Bilateral   .  Foot surgery Right 2013  . Rotator cuff repair Right   . Cardiac catheterization  2007    [place], [place]; x1 stent  . Cardiac catheterization  9/14    [facility]  . Breast lumpectomy Left 1997  . Joint replacement      L-hipx5, R hipx2, Lkneex1,   . Tonsillectomy    . Cholecystectomy  2005    open repair - for gangernous   . Peripherally inserted central catheter insertion      for IV antibiotics related to repeated infection, post hip replacement  . Reverse shoulder arthroplasty Right 09/29/2013    Procedure: REVERSE SHOULDER ARTHROPLASTY;  Surgeon: Nita Sells, MD;  Location: [place];  Service: Orthopedics;  Laterality: Right;  Right reverse total shoulder  . Hematoma evacuation Right 05/11/2015    Procedure: EVACUATION HEMATOMA/RIGHT HIP HEMATOMA DRAINAGE WITH WOUND VAC;  Surgeon: Hessie Knows, MD;  Location: [facility];  Service: Orthopedics;  Laterality: Right;  . Incision and drainage hip Right 06/19/2015    Procedure: IRRIGATION AND DEBRIDEMENT HIP;  Surgeon: Hessie Knows, MD;  Location: [facility];  Service: Orthopedics;  Laterality: Right;  hemovac placement    Family History  Problem Relation Age of Onset  . Hypertension Mother     Social History:  reports that she quit smoking about 30 years ago. Her smoking  use included Cigarettes. She has a 5 pack-year smoking history. She does not have any smokeless tobacco history on file. She reports that she drinks alcohol. She reports that she does not use illicit drugs.  Allergies  Allergen Reactions  . Morphine And Related Other (See Comments)    Reaction:  Hallucinations   . Oxycodone Other (See Comments)    Reaction:  Unknown   . Sulfa Antibiotics Itching  . Tazobactam Other (See Comments)    Reaction:  Unknown   . Adhesive [Tape] Rash  . Isosorbide Rash    Medications: I have reviewed the patient's current medications.  ROS: History obtained from chart review  General ROS: negative for - chills, fatigue, fever, night  sweats, weight gain or weight loss Psychological ROS: negative for - behavioral disorder, hallucinations, memory difficulties, mood swings or suicidal ideation Ophthalmic ROS: negative for - blurry vision, double vision, eye pain or loss of vision ENT ROS: negative for - epistaxis, nasal discharge, oral lesions, sore throat, tinnitus or vertigo Allergy and Immunology ROS: negative for - hives or itchy/watery eyes Hematological and Lymphatic ROS: negative for - bleeding problems, bruising or swollen lymph nodes Endocrine ROS: negative for - galactorrhea, hair pattern changes, polydipsia/polyuria or temperature intolerance Respiratory ROS: negative for - cough, hemoptysis, shortness of breath or wheezing Cardiovascular ROS: negative for - chest pain, dyspnea on exertion, edema or irregular heartbeat Gastrointestinal ROS: negative for - abdominal pain, diarrhea, hematemesis, nausea/vomiting or stool incontinence Genito-Urinary ROS: negative for - dysuria, hematuria, incontinence or urinary frequency/urgency Musculoskeletal ROS: negative for - joint swelling or muscular weakness Neurological ROS: as noted in HPI Dermatological ROS: negative for rash and skin lesion changes  Physical Examination: Blood pressure 101/83, pulse 96, temperature 98.2 F (36.8 C), temperature source Oral, resp. rate 18, height 5\' 6"  (1.676 m), weight 48.535 kg (107 lb), SpO2 95 %.   Neurological Examination Mental Status: Alert, oriented, thought content appropriate.  Speech fluent without evidence of aphasia.  Able to follow 3 step commands without difficulty. Cranial Nerves: II: Discs flat bilaterally; Visual fields grossly normal, pupils equal, round, reactive to light and accommodation III,IV, VI: ptosis not present, extra-ocular motions intact bilaterally V,VII: smile symmetric, facial light touch sensation normal bilaterally VIII: hearing normal bilaterally IX,X: gag reflex present XI: bilateral shoulder  shrug XII: midline tongue extension Motor: Right : Upper extremity   4/5    Left:     Upper extremity   4/5  Lower extremity   4/5     Lower extremity   4/5 Tone and bulk:normal tone throughout; no atrophy noted Sensory: Pinprick and light touch intact throughout, bilaterally Deep Tendon Reflexes: 1+ and symmetric throughout Plantars: Right: downgoing   Left: downgoing Cerebellar: normal finger-to-nose, normal rapid alternating movements and normal heel-to-shin test Gait: not tested.        Laboratory Studies:   Basic Metabolic Panel:  Recent Labs Lab 06/17/15 0121 06/18/15 0442 06/19/15 0442  NA 128* 135 137  K 4.4 4.0 3.9  CL 94* 105 108  CO2 26 23 26   GLUCOSE 90 85 89  BUN 19 12 8   CREATININE 0.56 0.51 0.36*  CALCIUM 7.8* 7.5* 7.5*  MG  --  1.7 2.1    Liver Function Tests:  Recent Labs Lab 06/17/15 0121  AST 12*  ALT <5*  ALKPHOS 107  BILITOT 0.5  PROT 5.3*  ALBUMIN 2.0*   No results for input(s): LIPASE, AMYLASE in the last 168 hours. No results for input(s): AMMONIA in  the last 168 hours.  CBC:  Recent Labs Lab 06/17/15 1309 06/18/15 0442 06/19/15 0442 06/20/15 0438 06/21/15 0715  WBC 4.5 5.3 3.8 6.2 6.4  HGB 9.0* 7.8* 8.2* 9.1* 8.9*  HCT 27.0* 23.3* 25.2* 27.1* 26.3*  MCV 84.6 86.0 86.7 89.0 87.1  PLT 517* 415 351 378 322    Cardiac Enzymes: No results for input(s): CKTOTAL, CKMB, CKMBINDEX, TROPONINI in the last 168 hours.  BNP: Invalid input(s): POCBNP  CBG: No results for input(s): GLUCAP in the last 168 hours.  Microbiology: Results for orders placed or performed during the hospital encounter of 06/17/15  Urine culture     Status: None   Collection Time: 06/18/15  1:39 PM  Result Value Ref Range Status   Specimen Description URINE, RANDOM  Final   Special Requests NONE  Final   Culture   Final    MULTIPLE SPECIES PRESENT, SUGGEST RECOLLECTION IF CLINICALLY INDICATED   Report Status 06/21/2015 FINAL  Final  Urine culture      Status: None   Collection Time: 06/19/15  6:46 PM  Result Value Ref Range Status   Specimen Description URINE, CLEAN CATCH  Final   Special Requests NONE  Final   Culture NO GROWTH 1 DAY  Final   Report Status 06/21/2015 FINAL  Final  Wound culture     Status: None (Preliminary result)   Collection Time: 06/19/15  6:46 PM  Result Value Ref Range Status   Specimen Description HIP  Final   Special Requests Immunocompromised  Final   Gram Stain   Final    RARE WBC SEEN RARE GRAM NEGATIVE RODS RARE GRAM POSITIVE RODS RARE GRAM POSITIVE COCCI IN PAIRS IN CLUSTERS    Culture NO GROWTH <24 HRS  Final   Report Status PENDING  Incomplete  Anaerobic culture     Status: None (Preliminary result)   Collection Time: 06/20/15  1:06 AM  Result Value Ref Range Status   Specimen Description WOUND  Final   Special Requests NONE  Final   Culture HOLDING FOR POSSIBLE ANAEROBE  Final   Report Status PENDING  Incomplete  Anaerobic culture     Status: None (Preliminary result)   Collection Time: 06/20/15  2:00 AM  Result Value Ref Range Status   Specimen Description WOUND  Final   Special Requests NONE  Final   Culture HOLDING FOR POSSIBLE ANAEROBE  Final   Report Status PENDING  Incomplete  Wound culture     Status: None (Preliminary result)   Collection Time: 06/20/15  2:00 AM  Result Value Ref Range Status   Specimen Description HIP  Final   Special Requests NONE  Final   Gram Stain FEW WBC SEEN NO ORGANISMS SEEN   Final   Culture NO GROWTH <24 HRS  Final   Report Status PENDING  Incomplete    Coagulation Studies: No results for input(s): LABPROT, INR in the last 72 hours.  Urinalysis:  Recent Labs Lab 06/18/15 1339  COLORURINE AMBER*  LABSPEC 1.016  PHURINE 5.0  GLUCOSEU NEGATIVE  HGBUR 2+*  BILIRUBINUR NEGATIVE  KETONESUR TRACE*  PROTEINUR NEGATIVE  NITRITE NEGATIVE  LEUKOCYTESUR 2+*    Lipid Panel:  No results found for: CHOL, TRIG, HDL, CHOLHDL, VLDL,  LDLCALC  HgbA1C: No results found for: HGBA1C  Urine Drug Screen:  No results found for: LABOPIA, COCAINSCRNUR, LABBENZ, AMPHETMU, THCU, LABBARB  Alcohol Level: No results for input(s): ETH in the last 168 hours.  Other results: EKG: normal EKG, normal  sinus rhythm, unchanged from previous tracings.  Imaging: No results found.   Assessment/Plan:  74 y.o. female presents complaining of soreness in her right hip. She's had a hip replacement and multiple revisions due to bleeding hematoma at the surgical site. She's been informed that she is anemic and presents to the hospital for transfusion and further surgical repair. Pt is s/p irrigation and debridement of R hip.  Pt has hx of Parkinson's disease.  Pt is on comptan and sinemet.   PD for past 5 yrs. Pt does not think that symptoms have worsened while in hospital.    On examination, pt does not have increased tone, mild resting tremor and hypophonic speech seen. - Pt does follow up with Dr. Manuella Ghazi as out pt who pt should follow up post d/c - I will not change any of her home medications at this time.   Leotis Pain  06/22/2015, 1:49 PM

## 2015-06-22 NOTE — Progress Notes (Signed)
Physical Therapy Treatment Patient Details Name: Sabrina Duncan MRN: [id number] DOB: [birth year] Today's Date: 06/22/2015    History of Present Illness 74 yo female with onset of bleeding R hip after THA at another hospital in April 2016 per pt.  Received irrigation and debridement for R leg hematoma, with recent admit as well for same issue.  Transfused for anemia, UTI and history polymyalgia rheumatica.    PT Comments    Pt was seen for further transfer and there ex with amazing improvement in standing control for cleaning her up from [facility].  Tolerating up to 2 minutes with PT giving her mod assist to help control the activity.  Will focus on standing and there ex in preparation for SNF return.  Follow Up Recommendations  SNF     Equipment Recommendations  None recommended by PT    Recommendations for Other Services       Precautions / Restrictions Precautions Precautions: Fall (telemetry) Restrictions Weight Bearing Restrictions: No    Mobility  Bed Mobility Overal bed mobility: Needs Assistance Bed Mobility: Supine to Sit;Sit to Supine     Supine to sit: Mod assist Sit to supine: Mod assist   General bed mobility comments: pad to slide hips after her surgery to drain hematoma  Transfers Overall transfer level: Needs assistance Equipment used: 1 person hand held assist;2 person hand held assist Transfers: Sit to/from [facility] Sit to Stand: Max assist;Mod assist Stand pivot transfers: Mod assist       General transfer comment: Cannot initiate and tends to tense,   Ambulation/Gait             General Gait Details: unable    Stairs            Wheelchair Mobility    Modified Rankin (Stroke Patients Only)       Balance Overall balance assessment: Needs assistance Sitting-balance support: Feet supported Sitting balance-Leahy Scale: Fair   Postural control: Posterior lean Standing balance support: Bilateral upper  extremity supported Standing balance-Leahy Scale: Poor                      Cognition Arousal/Alertness: Awake/alert Behavior During Therapy: Flat affect Overall Cognitive Status: History of cognitive impairments - at baseline       Memory: Decreased recall of precautions;Decreased short-term memory              Exercises Total Joint Exercises Ankle Circles/Pumps: Both;10 reps;AAROM Quad Sets: Both;10 reps;AAROM Heel Slides: AAROM;Both;10 reps Hip ABduction/ADduction: AAROM;Both;10 reps Long Arc Quad: AAROM;Both;10 reps Marching in Standing: Seated;Left;10 reps    General Comments General comments (skin integrity, edema, etc.): BSC with better sitting control and improved use of UE's to support herself on chair.  Pt is not complaining of any transfer pain      Pertinent Vitals/Pain Pain Assessment: Faces Faces Pain Scale: Hurts a little bit Pain Location: R hip Pain Intervention(s): Limited activity within patient's tolerance;Premedicated before session    Home Living                      Prior Function            PT Goals (current goals can now be found in the care plan section) Acute Rehab PT Goals Patient Stated Goal: to get onto [facility] Progress towards PT goals: Progressing toward goals    Frequency  Min 2X/week    PT Plan Current plan remains appropriate    Co-evaluation  End of Session Equipment Utilized During Treatment: Gait belt Activity Tolerance: Patient tolerated treatment well;Patient limited by pain;Patient limited by fatigue Patient left: in bed;with call bell/phone within reach;with bed alarm set     Time: 1415-1438 PT Time Calculation (min) (ACUTE ONLY): 23 min  Charges:  $Therapeutic Exercise: 8-22 mins $Therapeutic Activity: 8-22 mins                    G Codes:      Ramond Dial 2015/07/17, 3:05 PM   Mee Hives, PT MS Acute Rehab Dept. Number: [facility] [phone] and [place] [phone]

## 2015-06-22 NOTE — Care Management (Signed)
Important Message  Patient Details  Name: Sabrina Duncan MRN: [id number] Date of Birth: 04-Mar-1941   Medicare Important Message Given:  Geralyn Flash notification given    Juliann Pulse A Allmond 06/22/2015, 11:50 AM

## 2015-06-22 NOTE — Progress Notes (Signed)
Neuro Consult- Nurse secretary called Neurologist (Dr. Macie Burows) on call twice to notify the consult. Awaiting for a return call.

## 2015-06-22 NOTE — Progress Notes (Signed)
   Subjective: 3 Days Post-Op Procedure(s) (LRB): IRRIGATION AND DEBRIDEMENT HIP (Right) Patient reports pain as mild.   Patient is well, and has had no acute complaints or problems We will start therapy today.  Plan is to go Skilled nursing facility after hospital stay.  Objective: Vital signs in last 24 hours: Temp:  [97.6 F (36.4 C)-98.3 F (36.8 C)] 98.2 F (36.8 C) (07/01 0833) Pulse Rate:  [96-110] 96 (07/01 0833) Resp:  [18] 18 (07/01 0833) BP: (95-108)/(51-83) 101/83 mmHg (07/01 0833) SpO2:  [82 %-97 %] 95 % (07/01 0833) Weight:  [47.265 kg (104 lb 3.2 oz)-48.535 kg (107 lb)] 48.535 kg (107 lb) (07/01 0705)  Intake/Output from previous day: 06/30 0701 - 07/01 0700 In: 240 [P.O.:240] Out: 760 [Urine:650; Drains:110] Intake/Output this shift:     Recent Labs  06/20/15 0438 06/21/15 0715  HGB 9.1* 8.9*    Recent Labs  06/20/15 0438 06/21/15 0715  WBC 6.2 6.4  RBC 3.05* 3.02*  HCT 27.1* 26.3*  PLT 378 322   No results for input(s): NA, K, CL, CO2, BUN, CREATININE, GLUCOSE, CALCIUM in the last 72 hours. No results for input(s): LABPT, INR in the last 72 hours.  EXAM General - Patient is Alert and NAD Extremity - Neurovascular intact Sensation intact distally Intact pulses distally No cellulitis present Dressing - dressing C/D/I and hemovac removed Motor Function - intact, moving foot and toes well on exam.   Past Medical History  Diagnosis Date  . Depressive disorder, not elsewhere classified   . Unspecified osteomyelitis, site unspecified   . Polymyalgia rheumatica   . Paralysis agitans   . Osteoarthrosis, unspecified whether generalized or localized, unspecified site   . Muscle weakness (generalized)   . Difficulty in walking(719.7)   . Parkinson disease   . PMR (polymyalgia rheumatica)   . Coronary atherosclerosis of unspecified type of vessel, native or graft     2007: LAD PCI with a BMS, 80% distal LCX stenosis treated medically. Most  recent stress test in 04/2012 showed distal anterio/apical scar? with no ischemia, EF 53%.   [name] Unspecified essential hypertension   . Other and unspecified hyperlipidemia   . Thoracic ascending aortic aneurysm     4.0 cm  . Complication of anesthesia     2013- hallucinations- [facility]. (-spinal anesth. 2013- no problems   redo- hip- L)  . Esophageal reflux     pt. reports that its resolved   . Neuromuscular disorder     parkinson's - [facility] - neurologist   . Cancer     left breast cancer- lumpectomy, chemo, radiation, tamoxifen , SLNBx  . Anemia, unspecified     bld. transfusion- 02/2013    Assessment/Plan:   3 Days Post-Op Procedure(s) (LRB): IRRIGATION AND DEBRIDEMENT HIP (Right) Principal Problem:   Anemia Active Problems:   Hyponatremia   Pressure ulcer  Estimated body mass index is 17.28 kg/(m^2) as calculated from the following:   Height as of this encounter: 5\' 6"  (1.676 m).   Weight as of this encounter: 48.535 kg (107 lb). Up with therapy  Dc hemovac today Follow up with [place] [facility] in 7 days for skin check Sutures out and apply steri strips 14 datys post op Continue with abx  DVT Prophylaxis - SCDs Weight-Bearing as tolerated to right leg D/C O2 and Pulse OX and try on Room Air  T. Rachelle Hora, PA-C [place] 06/22/2015, 8:58 AM

## 2015-06-22 NOTE — Progress Notes (Signed)
Patient ID: Sabrina Duncan, female   DOB: 1941-11-02, 74 y.o.   MRN: [id number]  [facility] PROGRESS NOTE  PCP: Adrian Prows.  HPI/Subjective: Patient was seen earlier this morning along with the orthopedic team. The patient does not complain of any pain in her right hip. She was answering questions better today than yesterday. She still does not know where she is. Offered no physical complaints.  Objective: Filed Vitals:   06/22/15 0833  BP: 101/83  Pulse: 96  Temp: 98.2 F (36.8 C)  Resp: 18    Intake/Output Summary (Last 24 hours) at 06/22/15 1421 Last data filed at 06/22/15 1300  Gross per 24 hour  Intake    240 ml  Output    760 ml  Net   -520 ml   Filed Weights   06/21/15 0455 06/22/15 0500 06/22/15 0705  Weight: 47.265 kg (104 lb 3.2 oz) 47.265 kg (104 lb 3.2 oz) 48.535 kg (107 lb)    ROS: Review of Systems  Constitutional: Negative for fever and chills.  Eyes: Negative for blurred vision.  Respiratory: Negative for cough and shortness of breath.   Cardiovascular: Negative for chest pain.  Gastrointestinal: Negative for nausea, vomiting, abdominal pain, diarrhea and constipation.  Genitourinary: Negative for dysuria.  Musculoskeletal: Negative for joint pain.  Neurological: Negative for dizziness and headaches.   Exam: Physical Exam  HENT:  Head: Normocephalic and atraumatic.  Right Ear: No drainage.  Left Ear: No drainage.  Nose: No mucosal edema.  Mouth/Throat: No oropharyngeal exudate or posterior oropharyngeal edema.  Eyes: Conjunctivae, EOM and lids are normal. Pupils are equal, round, and reactive to light.  Neck: No JVD present. Carotid bruit is not present. No edema present. No thyroid mass and no thyromegaly present.  Cardiovascular: S1 normal and S2 normal.  Exam reveals no gallop.   Murmur heard.  Systolic murmur is present with a grade of 2/6  Pulses:      Dorsalis pedis pulses are 2+ on the right side, and 2+ on  the left side.  Respiratory: No respiratory distress. She has no wheezes. She has no rhonchi. She has no rales.  GI: Soft. Bowel sounds are normal. There is no tenderness.  Musculoskeletal:       Right ankle: She exhibits no swelling.       Left ankle: She exhibits no swelling.  Lymphadenopathy:    She has no cervical adenopathy.  Neurological: She is alert. No cranial nerve deficit.  History of right ankle weakness and wears a brace. Patient is confused this morning and think she is an employee here.  Skin: Skin is warm. No rash noted. Nails show no clubbing.  Patient was seen while orthopedic remove the dressing and Hemovac. The surgical site is clean and dry and sutures are intact. No signs of infection on the skin.  Psychiatric: Her mood appears anxious.    Data Reviewed: Basic Metabolic Panel:  Recent Labs Lab 06/17/15 0121 06/18/15 0442 06/19/15 0442  NA 128* 135 137  K 4.4 4.0 3.9  CL 94* 105 108  CO2 26 23 26   GLUCOSE 90 85 89  BUN 19 12 8   CREATININE 0.56 0.51 0.36*  CALCIUM 7.8* 7.5* 7.5*  MG  --  1.7 2.1   Liver Function Tests:  Recent Labs Lab 06/17/15 0121  AST 12*  ALT <5*  ALKPHOS 107  BILITOT 0.5  PROT 5.3*  ALBUMIN 2.0*   CBC:  Recent Labs Lab 06/17/15 1309 06/18/15 0442 06/19/15  9833 06/20/15 0438 06/21/15 0715  WBC 4.5 5.3 3.8 6.2 6.4  HGB 9.0* 7.8* 8.2* 9.1* 8.9*  HCT 27.0* 23.3* 25.2* 27.1* 26.3*  MCV 84.6 86.0 86.7 89.0 87.1  PLT 517* 415 351 378 322    Scheduled Meds: . ARIPiprazole  5 mg Oral Daily  . atorvastatin  20 mg Oral QHS  . buPROPion  100 mg Oral Daily  . carbidopa-levodopa  2 tablet Oral QID  . carbidopa-levodopa  2 tablet Oral QHS   And  . entacapone  200 mg Oral QHS  . cholecalciferol  2,000 Units Oral Daily  . clindamycin  300 mg Oral BID  . collagenase   Topical Daily  . docusate sodium  100 mg Oral BID  . feeding supplement (ENSURE ENLIVE)  237 mL Oral TID WC  . feeding supplement (PRO-STAT SUGAR FREE  64)  30 mL Oral BID BM  . ferrous fumarate  1 tablet Oral BID  . metroNIDAZOLE  250 mg Oral 3 times per day  . QUEtiapine  50 mg Oral Daily  . QUEtiapine  75 mg Oral QHS  . rifampin  300 mg Oral BID  . rOPINIRole  2 mg Oral TID  . sodium chloride  3 mL Intravenous Q12H  . venlafaxine XR  150 mg Oral Q breakfast  . vitamin B-12  1,000 mcg Oral Daily   Continuous Infusions:    Assessment/Plan:  1. Acute post hemorrhagic anemia- likely from hematoma right hip. Hemovac removed today. Patient's hemoglobin is stable. Patient is on ferrous fumarate. 2. Acute delirium- this does seem a little bit better today but she still does not know where she is. I did have to reorient her. Continue her usual medications for anxiety and depression and Parkinson's. Family requested a neurology evaluation. 3. Urinary retention- patient able to use the bathroom. 4. Parkinson's disease- continue usual medications. 5. 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 Since the wound culture is holding for possible anaerobe I have her on by mouth Flagyl also at this point. 6. Hyperlipidemia unspecified continue atorvastatin. 7. Depression and anxiety continue psychiatric medications. 8. PT recommended rehabilitation. Family asked if she can go back to the place that she is from. The social worker will have the facility, to evaluate her today. If they do not except that the patient will likely need rehabilitation.  Code Status:     Code Status Orders        Start     Ordered   06/17/15 0758  Full code   Continuous     06/17/15 0758    Advance Directive Documentation        Most Recent Value   Type of Advance Directive  Living will   Pre-existing out of facility DNR order (yellow form or pink MOST form)     "MOST" Form in Place?       Disposition Plan: Rehabilitation  Consultants:  Orthopedic surgery  Procedures:  Evacuation of hematoma right hip  Time spent: 22  minutes  [place] [place] [facility]

## 2015-06-22 NOTE — Consult Note (Signed)
WOC wound follow up Wound type: Right hip.  Hemovc removed today.  Follow up on left elbow, sacral ulcers.  Measurement: Right hip dressing was completely saturated in pink tinged clear drainage.  Suture line is intact and periwound skin is intact.  Applied calcium alginate to suture line to absorb drainage (weeping) and  ABD pads with tape.  Wound bed: Intact suture line.  Sacral wound:  2 cm x 1.5 cm 100% thin yellow slough Right and left buttocks near gluteal fold 0.5 cm x 0.5 cm x 0.1  Drainage (amount, consistency, odor)Pink tinged weeping around suture lines.  No odor.   Periwound:INtact Sacrum, coccyx and upper buttocks are blanchable redness. Will continue enzymatic debrider. Left elbow 1 cm x 0.5 cm x 1 cm  Patient states she had surgery on this a year ago.  Will begin to fill dead space with Iodoform gauze.  Purulent drainage noted today for the first time.   Dressing procedure/placement/frequency:Cleanse left elbow with NS and pat gently dry.  Gently fill dead space with Iodoform packing strip.  COver with 4x4 gauze, kerlix and tape.  Change daily.   Cleanse sacrum with NS and pat gently dry.  Apply Santyl to open lesions.  Cover with NS moist dressing. Top with 4x4 gauze and ABD pad with tape. Change daily.  DO not use silicone foam.   Right hip:  Cleanse suture line with NS and pat gently dry. Apply calcium alginate rope to suture line.  Top with ABD pad and secure with tape. Change daily.  Will not follow at this time.  Please re-consult if needed.  Domenic Moras RN BSN [place] Pager [phone]

## 2015-06-23 NOTE — Progress Notes (Signed)
Per MD patient will likely D/C back to [facility] tomorrow (Sunday 06/24/15). CSW will call [facility] at [phone] at time of D/C. CSW contacted Wolfgang Phoenix at [facility] today and made her aware of above. MD is aware of above. CSW will continue to follow and assist as needed.   Blima Rich, [place] [phone]

## 2015-06-23 NOTE — Progress Notes (Signed)
Patient ID: Sabrina Duncan, female   DOB: 1941/03/24, 74 y.o.   MRN: [id number]  [facility] PROGRESS NOTE  PCP: Adrian Prows.  HPI/Subjective: Complains of pain over the sacrum, does have an ulcer in this area. Is depressed and anxious. No hip pain at this time. No chest pain or shortness of breath.  Objective: Filed Vitals:   06/23/15 0750  BP: 101/64  Pulse: 102  Temp: 98.1 F (36.7 C)  Resp: 18    Intake/Output Summary (Last 24 hours) at 06/23/15 1431 Last data filed at 06/23/15 0715  Gross per 24 hour  Intake    483 ml  Output    300 ml  Net    183 ml   Filed Weights   06/22/15 0500 06/22/15 0705 06/23/15 0341  Weight: 47.265 kg (104 lb 3.2 oz) 48.535 kg (107 lb) 40.824 kg (90 lb)    ROS: Review of Systems  Constitutional: Negative for fever and chills.  Eyes: Negative for blurred vision.  Respiratory: Negative for cough and shortness of breath.   Cardiovascular: Negative for chest pain.  Gastrointestinal: Negative for nausea, vomiting, abdominal pain, diarrhea and constipation.  Genitourinary: Negative for dysuria.  Musculoskeletal: Negative for joint pain.  Neurological: Negative for dizziness and headaches.   Exam: Physical Exam  Constitutional: She is oriented to person, place, and time. She appears well-developed.  HENT:  Head: Normocephalic and atraumatic.  Eyes: Pupils are equal, round, and reactive to light.  Neck: Normal range of motion. No thyromegaly present.  Cardiovascular: Normal rate and regular rhythm.   Murmur heard. Respiratory: Effort normal and breath sounds normal. No respiratory distress. She has no wheezes.  GI: Soft. Bowel sounds are normal. She exhibits no distension. There is no tenderness.  Musculoskeletal: She exhibits no edema.  Lymphadenopathy:    She has no cervical adenopathy.  Neurological: She is alert and oriented to person, place, and time.  Skin: Skin is warm and dry.    Data  Reviewed: Basic Metabolic Panel:  Recent Labs Lab 06/17/15 0121 06/18/15 0442 06/19/15 0442  NA 128* 135 137  K 4.4 4.0 3.9  CL 94* 105 108  CO2 26 23 26   GLUCOSE 90 85 89  BUN 19 12 8   CREATININE 0.56 0.51 0.36*  CALCIUM 7.8* 7.5* 7.5*  MG  --  1.7 2.1   Liver Function Tests:  Recent Labs Lab 06/17/15 0121  AST 12*  ALT <5*  ALKPHOS 107  BILITOT 0.5  PROT 5.3*  ALBUMIN 2.0*   CBC:  Recent Labs Lab 06/17/15 1309 06/18/15 0442 06/19/15 0442 06/20/15 0438 06/21/15 0715  WBC 4.5 5.3 3.8 6.2 6.4  HGB 9.0* 7.8* 8.2* 9.1* 8.9*  HCT 27.0* 23.3* 25.2* 27.1* 26.3*  MCV 84.6 86.0 86.7 89.0 87.1  PLT 517* 415 351 378 322    Scheduled Meds: . ARIPiprazole  5 mg Oral Daily  . atorvastatin  20 mg Oral QHS  . buPROPion  100 mg Oral Daily  . carbidopa-levodopa  2 tablet Oral QID  . carbidopa-levodopa  2 tablet Oral QHS   And  . entacapone  200 mg Oral QHS  . cholecalciferol  2,000 Units Oral Daily  . clindamycin  300 mg Oral BID  . collagenase   Topical Daily  . docusate sodium  100 mg Oral BID  . feeding supplement (ENSURE ENLIVE)  237 mL Oral TID WC  . feeding supplement (PRO-STAT SUGAR FREE 64)  30 mL Oral BID BM  . ferrous fumarate  1 tablet Oral BID  . metroNIDAZOLE  250 mg Oral 3 times per day  . QUEtiapine  50 mg Oral Daily  . QUEtiapine  75 mg Oral QHS  . rifampin  300 mg Oral BID  . rOPINIRole  2 mg Oral TID  . sodium chloride  3 mL Intravenous Q12H  . venlafaxine XR  150 mg Oral Q breakfast  . vitamin B-12  1,000 mcg Oral Daily   Continuous Infusions:    Assessment/Plan:  1. Acute post hemorrhagic anemia-  - likely from hematoma right hip. Hemovac removed 06/22/2015.  - hemoglobin is stable. continue ferrous fumarate.  2. Acute delirium- improved - continue home regimen for depression and Parkinson's - has been seen by neurology, no changes advised.  3. Urinary retention- patient able to use the bathroom. 4. Parkinson's disease- continue  usual medications. 5. 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 Continue flagyl. Follows with Dr. Ola Spurr.  6. Hyperlipidemia unspecified continue atorvastatin. 7. Depression and anxiety continue psychiatric medications. 8. PT recommended rehabilitation. Plan for DC to SNF tomorrow.   Code Status:     Code Status Orders        Start     Ordered   06/17/15 0758  Full code   Continuous     06/17/15 0758    Advance Directive Documentation        Most Recent Value   Type of Advance Directive  Living will   Pre-existing out of facility DNR order (yellow form or pink MOST form)     "MOST" Form in Place?       Disposition Plan: Rehabilitation  Consultants:  Orthopedic surgery  Procedures:  Evacuation of hematoma right hip  Time spent: 30 minutes  Myrtis Ser  [facility] [phone]

## 2015-06-23 NOTE — Progress Notes (Signed)
Subjective: 4 Days Post-Op Procedure(s) (LRB): IRRIGATION AND DEBRIDEMENT HIP (Right) Patient reports pain as mild.   Patient is well, but has had some minor complaints of pain stemming from her lumbar ulcer. Plan is to go Skilled nursing facility after hospital stay. Negative for chest pain and shortness of breath Fever: no Gastrointestinal:Negative for nausea and vomiting  Objective: Vital signs in last 24 hours: Temp:  [97.7 F (36.5 C)-98.1 F (36.7 C)] 98.1 F (36.7 C) (07/02 0750) Pulse Rate:  [88-104] 102 (07/02 0750) Resp:  [17-18] 18 (07/02 0750) BP: (83-109)/(58-70) 101/64 mmHg (07/02 0750) SpO2:  [83 %-97 %] 97 % (07/02 0750) Weight:  [40.824 kg (90 lb)] 40.824 kg (90 lb) (07/02 0341)  Intake/Output from previous day:  Intake/Output Summary (Last 24 hours) at 06/23/15 1000 Last data filed at 06/23/15 0715  Gross per 24 hour  Intake    483 ml  Output    900 ml  Net   -417 ml    Intake/Output this shift:    Labs:  Recent Labs  06/21/15 0715  HGB 8.9*    Recent Labs  06/21/15 0715  WBC 6.4  RBC 3.02*  HCT 26.3*  PLT 322   No results for input(s): NA, K, CL, CO2, BUN, CREATININE, GLUCOSE, CALCIUM in the last 72 hours. No results for input(s): LABPT, INR in the last 72 hours.   EXAM General - Patient is Alert, Appropriate and Lacking  She is much more coherent that yesterday.   Extremity - Neurologically intact Intact pulses distally Dorsiflexion/Plantar flexion intact Incision: moderate drainage No cellulitis present Dressing/Incision - blood tinged drainage.  Dressing was changed today by me. Motor Function - intact, moving foot and toes well on exam.  Past Medical History  Diagnosis Date  . Depressive disorder, not elsewhere classified   . Unspecified osteomyelitis, site unspecified   . Polymyalgia rheumatica   . Paralysis agitans   . Osteoarthrosis, unspecified whether generalized or localized, unspecified site   . Muscle weakness  (generalized)   . Difficulty in walking(719.7)   . Parkinson disease   . PMR (polymyalgia rheumatica)   . Coronary atherosclerosis of unspecified type of vessel, native or graft     2007: LAD PCI with a BMS, 80% distal LCX stenosis treated medically. Most recent stress test in 04/2012 showed distal anterio/apical scar? with no ischemia, EF 53%.   [name] Unspecified essential hypertension   . Other and unspecified hyperlipidemia   . Thoracic ascending aortic aneurysm     4.0 cm  . Complication of anesthesia     2013- hallucinations- [facility]. (-spinal anesth. 2013- no problems   redo- hip- L)  . Esophageal reflux     pt. reports that its resolved   . Neuromuscular disorder     parkinson's - [facility] - neurologist   . Cancer     left breast cancer- lumpectomy, chemo, radiation, tamoxifen , SLNBx  . Anemia, unspecified     bld. transfusion- 02/2013    Assessment/Plan: 4 Days Post-Op Procedure(s) (LRB): IRRIGATION AND DEBRIDEMENT HIP (Right) Principal Problem:   Anemia Active Problems:   Hyponatremia   Pressure ulcer  Estimated body mass index is 14.53 kg/(m^2) as calculated from the following:   Height as of this encounter: 5\' 6"  (1.676 m).   Weight as of this encounter: 40.824 kg (90 lb). Advance diet Up with therapy  Plan is for discharge to SNF tomorrow. Follow-up with [place] ortho in 7 days for skin check  Sutures out and apply steri-strips 14 days post-op  Gram stain of the hip shows few WBC, few Gram - rods and rare gram + rods.  Still pending anaerobic culture. Continue ABX.  Placed pillow underneath patient's right buttock to alleviate pressure on both her right hip as well as her sacral ulcer.  DVT Prophylaxis - Foot Pumps and TED hose Weight-Bearing as tolerated to right leg  J. Cameron Proud, PA-C [facility] 06/23/2015, 10:00 AM

## 2015-06-24 LAB — WOUND CULTURE: CULTURE: NO GROWTH

## 2015-06-24 LAB — CBC
HCT: 25.5 % — ABNORMAL LOW (ref 35.0–47.0)
Hemoglobin: 8.3 g/dL — ABNORMAL LOW (ref 12.0–16.0)
MCH: 28.4 pg (ref 26.0–34.0)
MCHC: 32.4 g/dL (ref 32.0–36.0)
MCV: 87.6 fL (ref 80.0–100.0)
Platelets: 361 10*3/uL (ref 150–440)
RBC: 2.91 MIL/uL — ABNORMAL LOW (ref 3.80–5.20)
RDW: 18.7 % — AB (ref 11.5–14.5)
WBC: 6.2 10*3/uL (ref 3.6–11.0)

## 2015-06-24 MED ORDER — SODIUM CHLORIDE 0.9 % IV SOLN
INTRAVENOUS | Status: DC
  Administered 2015-06-24: 01:00:00 via INTRAVENOUS

## 2015-06-24 MED ORDER — COLLAGENASE 250 UNIT/GM EX OINT
TOPICAL_OINTMENT | Freq: Every day | CUTANEOUS | Status: DC
Start: 2015-06-24 — End: 2015-07-10

## 2015-06-24 MED ORDER — METRONIDAZOLE 250 MG PO TABS: 250.0000 mg | ORAL_TABLET | Freq: Three times a day (TID) | ORAL | Status: AC

## 2015-06-24 NOTE — Discharge Summary (Addendum)
[facility] [facility]   PATIENT NAME: Sabrina Duncan    MR#:  [id number]  DATE OF BIRTH:  1941-01-27  DATE OF ADMISSION:  06/17/2015 ADMITTING PHYSICIAN: Loletha Grayer, MD  DATE OF DISCHARGE: 06/24/2015  PRIMARY CARE PHYSICIAN: SPARKS,JEFFREY D, MD    ADMISSION DIAGNOSIS:  Thigh hematoma, right, subsequent encounter [S70.11XD] Symptomatic anemia [D64.9]  DISCHARGE DIAGNOSIS:  Principal Problem:   Anemia Active Problems:   Hyponatremia   Pressure ulcer   SECONDARY DIAGNOSIS:   Past Medical History  Diagnosis Date  . Depressive disorder, not elsewhere classified   . Unspecified osteomyelitis, site unspecified   . Polymyalgia rheumatica   . Paralysis agitans   . Osteoarthrosis, unspecified whether generalized or localized, unspecified site   . Muscle weakness (generalized)   . Difficulty in walking(719.7)   . Parkinson disease   . PMR (polymyalgia rheumatica)   . Coronary atherosclerosis of unspecified type of vessel, native or graft     2007: LAD PCI with a BMS, 80% distal LCX stenosis treated medically. Most recent stress test in 04/2012 showed distal anterio/apical scar? with no ischemia, EF 53%.   [name] Unspecified essential hypertension   . Other and unspecified hyperlipidemia   . Thoracic ascending aortic aneurysm     4.0 cm  . Complication of anesthesia     2013- hallucinations- [facility] (-spinal anesth. 2013- no problems   redo- hip- L)  . Esophageal reflux     pt. reports that its resolved   . Neuromuscular disorder     parkinson's - [facility] - neurologist   . Cancer     left breast cancer- lumpectomy, chemo, radiation, tamoxifen , SLNBx  . Anemia, unspecified     bld. transfusion- 02/2013    HOSPITAL COURSE:   1. Acute post hemorrhagic anemia with chronic iron deficiency anemia- due to continued drainage from right hip hematoma. She is 5 days postop of irrigation and  debridement of the right hip by Dr. Rudene Christians.  Wound VAC removed 06/22/2015. Hemoglobin has been fairly stable. Continue iron supplement. She will follow up with orthopedics in one week. For the wound the suture line needs to be cleaned with normal saline and patted dry. Apply calcium alginate rope to suture line. Top with ABD pad and secure with tape. Change daily.  2. Acute delirium- Brief period of delirium during hospitalization now improved. May have been due to parkinson's disease, verses dementia in new environment.  Family reports that she often is disoriented in new environments. Continue home regimen for depression and Parkinson's. Seen by neurology, no changes advised. She will follow up with her outpatient neurologist Dr. Manuella Ghazi. She was seen by psychiatry inpatient during the last admission and she declined to see them again during this admission. Prior to this admission she was seen by a psychiatrist at [facility].  Will need close monitoring of multiple medications.  3. Urinary retention- resolved.  4. Parkinson's disease- continue Cinamet-entacapone. Follow up with Dr. Manuella Ghazi, neurology.  5. 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 Flagyl added to cover possible anaerobe, culture pending. Will need to follow up with Dr. Ola Spurr.   6. Hyperlipidemia unspecified continue atorvastatin.  7. Depression and anxiety continue psychiatric medications. As discussed above.  8. Disposition: Discharge to ALF at [facility].  Needs continued PT and nursing care.  9. Stage 4 sacral ulcer: continue care per wound care instructions. Cleanse sacrum with normal  saline and pat gently dry. Apply Santyl T open lesions. Cover with normal saline moist dressing. Top with 4 x 4 gauze and ABD pad with tape. Change daily. Do not use silicone foam.  10. Disposition: Being discharged back to assisted living facility. She needs a home health evaluation once she  arrives there.  DISCHARGE CONDITIONS:   Fair  CONSULTS OBTAINED:  Treatment Team:  Hessie Knows, MD Leotis Pain, MD  DRUG ALLERGIES:   Allergies  Allergen Reactions  . Morphine And Related Other (See Comments)    Reaction:  Hallucinations   . Oxycodone Other (See Comments)    Reaction:  Unknown   . Sulfa Antibiotics Itching  . Tazobactam Other (See Comments)    Reaction:  Unknown   . Adhesive [Tape] Rash  . Isosorbide Rash    DISCHARGE MEDICATIONS:   Current Discharge Medication List    START taking these medications   Details  collagenase (SANTYL) ointment Apply topically daily. Qty: 15 g, Refills: 0    metroNIDAZOLE (FLAGYL) 250 MG tablet Take 1 tablet (250 mg total) by mouth every 8 (eight) hours. Qty: 30 tablet, Refills: 0      CONTINUE these medications which have NOT CHANGED   Details  acetaminophen (TYLENOL) 325 MG tablet Take 2 tablets (650 mg total) by mouth every 6 (six) hours as needed for mild pain (or Fever >/= 101). Qty: 30 tablet, Refills: 0    Amino Acids-Protein Hydrolys (FEEDING SUPPLEMENT, PRO-STAT SUGAR FREE 64,) LIQD Take 30 mLs by mouth 2 (two) times daily between meals.    ARIPiprazole (ABILIFY) 5 MG tablet Take 1 tablet (5 mg total) by mouth daily. Qty: 30 tablet, Refills: 0    atorvastatin (LIPITOR) 20 MG tablet Take 20 mg by mouth at bedtime.     carbidopa-levodopa (SINEMET CR) 50-200 MG per tablet Take 2 tablets by mouth 4 (four) times daily.    carbidopa-levodopa-entacapone (STALEVO) 50-200-200 MG per tablet Take 1 tablet by mouth at bedtime.     Cholecalciferol (VITAMIN D3) 1000 UNITS CAPS Take 2 capsules by mouth daily.     clindamycin (CLEOCIN) 300 MG capsule Take 1 capsule (300 mg total) by mouth 2 (two) times daily. Qty: 60 capsule, Refills: 3   Associated Diagnoses: Prosthetic hip infection, sequela    docusate sodium 100 MG CAPS Take 100 mg by mouth 2 (two) times daily. Qty: 30 capsule, Refills: 0    LORazepam  (ATIVAN) 0.5 MG tablet Take 0.5 mg by mouth every 8 (eight) hours as needed for anxiety.    polyethylene glycol powder (GLYCOLAX/MIRALAX) powder Take 17 g by mouth daily as needed for mild constipation or moderate constipation.    !! QUEtiapine (SEROQUEL) 25 MG tablet Take 75 mg by mouth at bedtime.     !! QUEtiapine (SEROQUEL) 50 MG tablet Take 50 mg by mouth daily.     rifampin (RIFADIN) 300 MG capsule Take 300 mg by mouth 2 (two) times daily.    rOPINIRole (REQUIP) 2 MG tablet Take 2 mg by mouth 3 (three) times daily.    senna (SENOKOT) 8.6 MG TABS tablet Take 1 tablet (8.6 mg total) by mouth daily as needed for mild constipation. Qty: 120 each, Refills: 0    traMADol (ULTRAM) 50 MG tablet Take 1 tablet (50 mg total) by mouth every 6 (six) hours as needed for moderate pain. Qty: 30 tablet, Refills: 0    venlafaxine XR (EFFEXOR-XR) 150 MG 24 hr capsule Take 150 mg by mouth daily with breakfast.  vitamin B-12 (CYANOCOBALAMIN) 1000 MCG tablet Take 1,000 mcg by mouth daily.     buPROPion (WELLBUTRIN) 100 MG tablet Take 100 mg by mouth daily.    ferrous fumarate (HEMOCYTE - 106 MG FE) 325 (106 FE) MG TABS tablet Take 1 tablet (106 mg of iron total) by mouth 2 (two) times daily. Qty: 30 each, Refills: 0     !! - Potential duplicate medications found. Please discuss with provider.    STOP taking these medications     metoprolol (LOPRESSOR) 50 MG tablet          DISCHARGE INSTRUCTIONS:     DIET:  Regular diet with protein suppliment  DISCHARGE CONDITION:  Fair  ACTIVITY:  Activity as tolerated, per PT recommendations  OXYGEN:  Home Oxygen: No.   Oxygen Delivery: room air  DISCHARGE LOCATION:  nursing home   If you experience worsening of your admission symptoms, develop shortness of breath, life threatening emergency, suicidal or homicidal thoughts you must seek medical attention immediately by calling 911 or calling your MD immediately  if symptoms less  severe.  You Must read complete instructions/literature along with all the possible adverse reactions/side effects for all the Medicines you take and that have been prescribed to you. Take any new Medicines after you have completely understood and accpet all the possible adverse reactions/side effects.   Please note  You were cared for by a hospitalist during your hospital stay. If you have any questions about your discharge medications or the care you received while you were in the hospital after you are discharged, you can call the unit and asked to speak with the hospitalist on call if the hospitalist that took care of you is not available. Once you are discharged, your primary care physician will handle any further medical issues. Please note that NO REFILLS for any discharge medications will be authorized once you are discharged, as it is imperative that you return to your primary care physician (or establish a relationship with a primary care physician if you do not have one) for your aftercare needs so that they can reassess your need for medications and monitor your lab values.  Today   CHIEF COMPLAINT:   Chief Complaint  Patient presents with  . Anemia    HISTORY OF PRESENT ILLNESS:  Patient presents emergency department complaining of soreness in her right hip. She's had a hip replacement and multiple revisions due to bleeding hematoma at the surgical site. She's been informed that she is anemic and presents to the hospital for transfusion and further surgical repair. Patient denies chest pain or shortness of breath. Her condition was discussed with the orthopedic surgeon on call who recommended admission to the internal medicine service for medical management.  VITAL SIGNS:  Blood pressure 99/54, pulse 100, temperature 97.8 F (36.6 C), temperature source Oral, resp. rate 18, height 5\' 6"  (1.676 m), weight 40.824 kg (90 lb), SpO2 95 %.  I/O:    Intake/Output Summary (Last 24  hours) at 06/24/15 0945 Last data filed at 06/24/15 0734  Gross per 24 hour  Intake    551 ml  Output    650 ml  Net    -99 ml    PHYSICAL EXAMINATION:  GENERAL:  74 y.o.-year-old patient lying in the bed with no acute distress. Anxious. Thin. EYES: Pupils equal, round, reactive to light and accommodation. No scleral icterus. Extraocular muscles intact.  HEENT: Head atraumatic, normocephalic. Oropharynx and nasopharynx clear.  NECK:  Supple, no jugular  venous distention. No thyroid enlargement, no tenderness.  LUNGS: Normal breath sounds bilaterally, no wheezing, rales,rhonchi or crepitation. No use of accessory muscles of respiration.  CARDIOVASCULAR: S1, S2 normal. No murmurs, rubs, or gallops.  ABDOMEN: Soft, non-tender, non-distended. Bowel sounds present. No organomegaly or mass.  EXTREMITIES: No pedal edema, cyanosis, or clubbing. Right hip dressing clean and dry. Muscle wasting all estremities NEUROLOGIC: Cranial nerves II through XII are intact. Muscle strength 4+/5 in all extremities. Sensation intact. Gait not checked. Developing contractures of forearm muscles PSYCHIATRIC: The patient is alert and oriented x 3.  Anxious. SKIN: right hip incision bandaged, stage 4 sacral ulcer  DATA REVIEW:   CBC  Recent Labs Lab 06/24/15 0424  WBC 6.2  HGB 8.3*  HCT 25.5*  PLT 361    Chemistries   Recent Labs Lab 06/19/15 0442  NA 137  K 3.9  CL 108  CO2 26  GLUCOSE 89  BUN 8  CREATININE 0.36*  CALCIUM 7.5*  MG 2.1    Cardiac Enzymes No results for input(s): TROPONINI in the last 168 hours.  Microbiology Results  Results for orders placed or performed during the hospital encounter of 06/17/15  Urine culture     Status: None   Collection Time: 06/18/15  1:39 PM  Result Value Ref Range Status   Specimen Description URINE, RANDOM  Final   Special Requests NONE  Final   Culture   Final    MULTIPLE SPECIES PRESENT, SUGGEST RECOLLECTION IF CLINICALLY INDICATED    Report Status 06/21/2015 FINAL  Final  Urine culture     Status: None   Collection Time: 06/19/15  6:46 PM  Result Value Ref Range Status   Specimen Description URINE, CLEAN CATCH  Final   Special Requests NONE  Final   Culture NO GROWTH 1 DAY  Final   Report Status 06/21/2015 FINAL  Final  Wound culture     Status: None (Preliminary result)   Collection Time: 06/19/15  6:46 PM  Result Value Ref Range Status   Specimen Description HIP  Final   Special Requests Immunocompromised  Final   Gram Stain   Final    FEW WBC SEEN FEW GRAM NEGATIVE RODS RARE GRAM POSITIVE RODS RARE GRAM POSITIVE COCCI IN PAIRS IN CLUSTERS    Culture RARE GROWTH COAGULASE NEGATIVE STAPHYLOCOCCUS  Final   Report Status PENDING  Incomplete  Anaerobic culture     Status: None (Preliminary result)   Collection Time: 06/20/15  1:06 AM  Result Value Ref Range Status   Specimen Description WOUND  Final   Special Requests NONE  Final   Culture HOLDING FOR POSSIBLE ANAEROBE  Final   Report Status PENDING  Incomplete  Anaerobic culture     Status: None (Preliminary result)   Collection Time: 06/20/15  2:00 AM  Result Value Ref Range Status   Specimen Description WOUND  Final   Special Requests NONE  Final   Culture HOLDING FOR POSSIBLE ANAEROBE  Final   Report Status PENDING  Incomplete  Wound culture     Status: None (Preliminary result)   Collection Time: 06/20/15  2:00 AM  Result Value Ref Range Status   Specimen Description HIP  Final   Special Requests NONE  Final   Gram Stain   Final    FEW WBC SEEN FEW GRAM NEGATIVE RODS RARE GRAM POSITIVE RODS    Culture NO GROWTH 2 DAYS  Final   Report Status PENDING  Incomplete    RADIOLOGY:  No results found.  EKG:   Orders placed or performed during the hospital encounter of 06/17/15  . ED EKG  . ED EKG  . EKG 12-Lead  . EKG 12-Lead      Management plans discussed with the patient, family and they are in agreement.  CODE STATUS:     Code  Status Orders        Start     Ordered   06/17/15 0758  Full code   Continuous     06/17/15 0758    Advance Directive Documentation        Most Recent Value   Type of Advance Directive  Living will   Pre-existing out of facility DNR order (yellow form or pink MOST form)     "MOST" Form in Place?        TOTAL TIME TAKING CARE OF THIS PATIENT: 45 minutes.   Care discussed with patient's daughter Loletha Carrow prior to discharge.  Myrtis Ser M.D on 06/24/2015 at 9:45 AM  Between 7am to 6pm - Pager - [phone]  After 6pm go to [email or web address] - password [email or web address] [place] [place] [facility]  Office  [phone]  CC: Primary care physician; Idelle Crouch, MD

## 2015-06-24 NOTE — Progress Notes (Signed)
Subjective: 5 Days Post-Op Procedure(s) (LRB): IRRIGATION AND DEBRIDEMENT HIP (Right) Patient reports pain as mild.   Patient is well, and has had no acute complaints or problems Plan is to go Skilled nursing facility after hospital stay. Negative for chest pain and shortness of breath Fever: no Gastrointestinal:Negative for nausea and vomiting  Objective: Vital signs in last 24 hours: Temp:  [97.8 F (36.6 C)-98.1 F (36.7 C)] 97.8 F (36.6 C) (07/03 0734) Pulse Rate:  [100-110] 100 (07/03 0734) Resp:  [18-20] 18 (07/03 0734) BP: (92-107)/(41-54) 99/54 mmHg (07/03 0734) SpO2:  [95 %-100 %] 95 % (07/03 0734)  Intake/Output from previous day:  Intake/Output Summary (Last 24 hours) at 06/24/15 0919 Last data filed at 06/24/15 0734  Gross per 24 hour  Intake    551 ml  Output    650 ml  Net    -99 ml    Intake/Output this shift:    Labs:  Recent Labs  06/24/15 0424  HGB 8.3*    Recent Labs  06/24/15 0424  WBC 6.2  RBC 2.91*  HCT 25.5*  PLT 361   No results for input(s): NA, K, CL, CO2, BUN, CREATININE, GLUCOSE, CALCIUM in the last 72 hours. No results for input(s): LABPT, INR in the last 72 hours.   EXAM General - Patient is Alert and Appropriate Extremity - Neurovascular intact Sensation intact distally Dorsiflexion/Plantar flexion intact Incision: scant drainage No cellulitis present Dressing/Incision - blood tinged drainage.  Dressing changed today Motor Function - intact, moving foot and toes well on exam.  Past Medical History  Diagnosis Date  . Depressive disorder, not elsewhere classified   . Unspecified osteomyelitis, site unspecified   . Polymyalgia rheumatica   . Paralysis agitans   . Osteoarthrosis, unspecified whether generalized or localized, unspecified site   . Muscle weakness (generalized)   . Difficulty in walking(719.7)   . Parkinson disease   . PMR (polymyalgia rheumatica)   . Coronary atherosclerosis of unspecified type of  vessel, native or graft     2007: LAD PCI with a BMS, 80% distal LCX stenosis treated medically. Most recent stress test in 04/2012 showed distal anterio/apical scar? with no ischemia, EF 53%.   [name] Unspecified essential hypertension   . Other and unspecified hyperlipidemia   . Thoracic ascending aortic aneurysm     4.0 cm  . Complication of anesthesia     2013- hallucinations- [facility]. (-spinal anesth. 2013- no problems   redo- hip- L)  . Esophageal reflux     pt. reports that its resolved   . Neuromuscular disorder     parkinson's - [facility] - neurologist   . Cancer     left breast cancer- lumpectomy, chemo, radiation, tamoxifen , SLNBx  . Anemia, unspecified     bld. transfusion- 02/2013    Assessment/Plan: 5 Days Post-Op Procedure(s) (LRB): IRRIGATION AND DEBRIDEMENT HIP (Right) Principal Problem:   Anemia Active Problems:   Hyponatremia   Pressure ulcer  Estimated body mass index is 14.53 kg/(m^2) as calculated from the following:   Height as of this encounter: 5\' 6"  (1.676 m).   Weight as of this encounter: 40.824 kg (90 lb). Advance diet Discharge to SNF  Patient to be d/c by medicine service today. Dressing change applied today. Will f/u with [facility] in 7 days for skin check with Dr. Wells Guiles Sutures out and apply steri strips 14 days post-op. Continue ABX  DVT Prophylaxis - SCDs Weight-Bearing as tolerated to  left leg  J. Cameron Proud, PA-C [facility]opa[facility] 06/24/2015, 9:19 AM

## 2015-06-24 NOTE — Progress Notes (Signed)
Pt A and O x 3. VSS. Pt tolerating diet well. Pt is not up and ambulating. Pt has several wounds and skin tears that have been dressed.Pt using bedpan to void and for urinating. No complaints of pain or nausea. Pt had Iv removed and left via EMS to facility.

## 2015-06-24 NOTE — Clinical Social Work Note (Signed)
CSW notified RN, pt, pt's family Ms. Merchel and facility [facility]) that pt would DC today via EMS back to their facility.  CSW signing off

## 2015-06-24 NOTE — Discharge Instructions (Signed)
NSTRUCTIONS AFTER Surgery  o Remove items at home which could result in a fall. This includes throw rugs or furniture in walking pathways o ICE to the affected joint every three hours while awake for 30 minutes at a time, for at least the first 3-5 days, and then as needed for pain and swelling.  Continue to use ice for pain and swelling. You may notice swelling that will progress down to the foot and ankle.  This is normal after surgery.  Elevate your leg when you are not up walking on it.   o Continue to use the breathing machine you got in the hospital (incentive spirometer) which will help keep your temperature down.  It is common for your temperature to cycle up and down following surgery, especially at night when you are not up moving around and exerting yourself.  The breathing machine keeps your lungs expanded and your temperature down.  DIET:  As you were doing prior to hospitalization, we recommend a well-balanced diet.  DRESSING / WOUND CARE / SHOWERING  Keep the dressing as dry as possible.  If you see any discharge or drainage, change the dressing.  Do not get the dressing wet.  ACTIVITY o Increase activity slowly as tolerated, but follow the weight bearing instructions below.   o No driving for 6 weeks or until further direction given by your physician.  You cannot drive while taking narcotics.  o No lifting or carrying greater than 10 lbs. until further directed by your surgeon. o Avoid periods of inactivity such as sitting longer than an hour when not asleep. This helps prevent blood clots.  o You may return to work once you are authorized by your doctor.   WEIGHT BEARING  Weight-bear as tolerated to your left leg.  CONSTIPATION  Constipation is defined medically as fewer than three stools per week and severe constipation as less than one stool per week.  Even if you have a regular bowel pattern at home, your normal regimen is likely to be disrupted due to multiple reasons  following surgery.  Combination of anesthesia, postoperative narcotics, change in appetite and fluid intake all can affect your bowels.   YOU MUST use at least one of the following options; they are listed in order of increasing strength to get the job done.  They are all available over the counter, and you may need to use some, POSSIBLY even all of these options:    Drink plenty of fluids (prune juice may be helpful) and high fiber foods Colace 100 mg by mouth twice a day  Senokot for constipation as directed and as needed Dulcolax (bisacodyl), take with full glass of water  Miralax (polyethylene glycol) once or twice a day as needed.  If you have tried all these things and are unable to have a bowel movement in the first 3-4 days after surgery call either your surgeon or your primary doctor.    If you experience loose stools or diarrhea, hold the medications until you stool forms back up.  If your symptoms do not get better within 1 week or if they get worse, check with your doctor.  If you experience "the worst abdominal pain ever" or develop nausea or vomiting, please contact the office immediately for further recommendations for treatment.   ITCHING:  If you experience itching with your medications, try taking only a single pain pill, or even half a pain pill at a time.  You can also use Benadryl over the  counter for itching or also to help with sleep.   TED HOSE STOCKINGS:  Use stockings on both legs until for at least 2 weeks or as directed by physician office. They may be removed at night for sleeping.  MEDICATIONS:  See your medication summary on the After Visit Summary that nursing will review with you.  You may have some home medications which will be placed on hold until you complete the course of blood thinner medication.  It is important for you to complete the blood thinner medication as prescribed.  PRECAUTIONS:  If you experience chest pain or shortness of breath - call 911  immediately for transfer to the hospital emergency department.   If you develop a fever greater that 101 F, purulent drainage from wound, increased redness or drainage from wound, foul odor from the wound/dressing, or calf pain - CONTACT YOUR SURGEON.                                                   FOLLOW-UP APPOINTMENTS:  Follow-up with [facility] in 7 days with Dr. Rudene Christians for a skin check.  Sutures will be removed 14 days after surgery.  MAKE SURE YOU:   Understand these instructions.   Get help right away if you are not doing well or get worse.    Thank you for letting us be a part of your medical care team.  It is a privilege we respect greatly.  We hope these instructions will help you stay on track for a fast and full recovery!

## 2015-06-25 LAB — ANAEROBIC CULTURE

## 2015-06-27 ENCOUNTER — Emergency Department: Payer: Medicare Other

## 2015-06-27 ENCOUNTER — Encounter: Payer: Self-pay | Admitting: Emergency Medicine

## 2015-06-27 ENCOUNTER — Emergency Department
Admission: EM | Admit: 2015-06-27 | Discharge: 2015-06-27 | Disposition: A | Discharge status: Home / Self Care | Payer: Medicare Other | Source: Home / Self Care | Attending: Student | Admitting: Student

## 2015-06-27 DIAGNOSIS — R64 Cachexia: Secondary | ICD-10-CM | POA: Diagnosis present

## 2015-06-27 DIAGNOSIS — Y831 Surgical operation with implant of artificial internal device as the cause of abnormal reaction of the patient, or of later complication, without mention of misadventure at the time of the procedure: Secondary | ICD-10-CM

## 2015-06-27 DIAGNOSIS — D62 Acute posthemorrhagic anemia: Secondary | ICD-10-CM | POA: Diagnosis present

## 2015-06-27 DIAGNOSIS — Z87891 Personal history of nicotine dependence: Secondary | ICD-10-CM

## 2015-06-27 DIAGNOSIS — M25551 Pain in right hip: Secondary | ICD-10-CM | POA: Insufficient documentation

## 2015-06-27 DIAGNOSIS — I959 Hypotension, unspecified: Secondary | ICD-10-CM | POA: Diagnosis present

## 2015-06-27 DIAGNOSIS — Z885 Allergy status to narcotic agent status: Secondary | ICD-10-CM

## 2015-06-27 DIAGNOSIS — M1990 Unspecified osteoarthritis, unspecified site: Secondary | ICD-10-CM | POA: Diagnosis present

## 2015-06-27 DIAGNOSIS — L89150 Pressure ulcer of sacral region, unstageable: Secondary | ICD-10-CM | POA: Diagnosis present

## 2015-06-27 DIAGNOSIS — I10 Essential (primary) hypertension: Secondary | ICD-10-CM | POA: Insufficient documentation

## 2015-06-27 DIAGNOSIS — M96830 Postprocedural hemorrhage and hematoma of a musculoskeletal structure following a musculoskeletal system procedure: Principal | ICD-10-CM | POA: Diagnosis present

## 2015-06-27 DIAGNOSIS — Z882 Allergy status to sulfonamides status: Secondary | ICD-10-CM

## 2015-06-27 DIAGNOSIS — Z96641 Presence of right artificial hip joint: Secondary | ICD-10-CM

## 2015-06-27 DIAGNOSIS — Z7982 Long term (current) use of aspirin: Secondary | ICD-10-CM

## 2015-06-27 DIAGNOSIS — T8189XA Other complications of procedures, not elsewhere classified, initial encounter: Secondary | ICD-10-CM | POA: Insufficient documentation

## 2015-06-27 DIAGNOSIS — Z79899 Other long term (current) drug therapy: Secondary | ICD-10-CM

## 2015-06-27 DIAGNOSIS — T148XXA Other injury of unspecified body region, initial encounter: Secondary | ICD-10-CM

## 2015-06-27 DIAGNOSIS — R569 Unspecified convulsions: Secondary | ICD-10-CM | POA: Diagnosis present

## 2015-06-27 DIAGNOSIS — E46 Unspecified protein-calorie malnutrition: Secondary | ICD-10-CM | POA: Diagnosis present

## 2015-06-27 DIAGNOSIS — E785 Hyperlipidemia, unspecified: Secondary | ICD-10-CM | POA: Diagnosis present

## 2015-06-27 DIAGNOSIS — M353 Polymyalgia rheumatica: Secondary | ICD-10-CM | POA: Diagnosis present

## 2015-06-27 DIAGNOSIS — F333 Major depressive disorder, recurrent, severe with psychotic symptoms: Secondary | ICD-10-CM | POA: Diagnosis present

## 2015-06-27 DIAGNOSIS — A419 Sepsis, unspecified organism: Secondary | ICD-10-CM | POA: Diagnosis not present

## 2015-06-27 DIAGNOSIS — R339 Retention of urine, unspecified: Secondary | ICD-10-CM | POA: Diagnosis present

## 2015-06-27 DIAGNOSIS — Z792 Long term (current) use of antibiotics: Secondary | ICD-10-CM

## 2015-06-27 DIAGNOSIS — L24A9 Irritant contact dermatitis due friction or contact with other specified body fluids: Secondary | ICD-10-CM

## 2015-06-27 DIAGNOSIS — Z79891 Long term (current) use of opiate analgesic: Secondary | ICD-10-CM

## 2015-06-27 DIAGNOSIS — F411 Generalized anxiety disorder: Secondary | ICD-10-CM | POA: Diagnosis present

## 2015-06-27 DIAGNOSIS — N390 Urinary tract infection, site not specified: Secondary | ICD-10-CM | POA: Diagnosis not present

## 2015-06-27 DIAGNOSIS — S7001XS Contusion of right hip, sequela: Secondary | ICD-10-CM

## 2015-06-27 DIAGNOSIS — I2510 Atherosclerotic heart disease of native coronary artery without angina pectoris: Secondary | ICD-10-CM | POA: Diagnosis present

## 2015-06-27 DIAGNOSIS — N828 Other female genital tract fistulae: Secondary | ICD-10-CM | POA: Diagnosis present

## 2015-06-27 DIAGNOSIS — Z888 Allergy status to other drugs, medicaments and biological substances status: Secondary | ICD-10-CM

## 2015-06-27 DIAGNOSIS — X58XXXS Exposure to other specified factors, sequela: Secondary | ICD-10-CM

## 2015-06-27 DIAGNOSIS — G20 Parkinson's disease: Secondary | ICD-10-CM | POA: Diagnosis present

## 2015-06-27 LAB — BASIC METABOLIC PANEL
Anion gap: 6 (ref 5–15)
BUN: 16 mg/dL (ref 6–20)
CALCIUM: 7.6 mg/dL — AB (ref 8.9–10.3)
CO2: 28 mmol/L (ref 22–32)
Chloride: 103 mmol/L (ref 101–111)
Creatinine, Ser: 0.5 mg/dL (ref 0.44–1.00)
GLUCOSE: 87 mg/dL (ref 65–99)
Potassium: 4 mmol/L (ref 3.5–5.1)
SODIUM: 137 mmol/L (ref 135–145)

## 2015-06-27 LAB — CBC WITH DIFFERENTIAL/PLATELET
Basophils Absolute: 0 10*3/uL (ref 0–0.1)
Basophils Relative: 1 %
EOS ABS: 0.2 10*3/uL (ref 0–0.7)
EOS PCT: 4 %
HEMATOCRIT: 24.2 % — AB (ref 35.0–47.0)
HEMOGLOBIN: 7.7 g/dL — AB (ref 12.0–16.0)
Lymphocytes Relative: 11 %
Lymphs Abs: 0.6 10*3/uL — ABNORMAL LOW (ref 1.0–3.6)
MCH: 28.2 pg (ref 26.0–34.0)
MCHC: 31.9 g/dL — ABNORMAL LOW (ref 32.0–36.0)
MCV: 88.3 fL (ref 80.0–100.0)
MONOS PCT: 8 %
Monocytes Absolute: 0.4 10*3/uL (ref 0.2–0.9)
Neutro Abs: 4.2 10*3/uL (ref 1.4–6.5)
Neutrophils Relative %: 76 %
Platelets: 383 10*3/uL (ref 150–440)
RBC: 2.75 MIL/uL — ABNORMAL LOW (ref 3.80–5.20)
RDW: 19.9 % — ABNORMAL HIGH (ref 11.5–14.5)
WBC: 5.4 10*3/uL (ref 3.6–11.0)

## 2015-06-27 LAB — PROTIME-INR
INR: 1.18
Prothrombin Time: 15.2 seconds — ABNORMAL HIGH (ref 11.4–15.0)

## 2015-06-27 LAB — SEDIMENTATION RATE: Sed Rate: 126 mm/hr — ABNORMAL HIGH (ref 0–30)

## 2015-06-27 MED ORDER — LORAZEPAM 1 MG PO TABS
1.0000 mg | ORAL_TABLET | Freq: Once | ORAL | Status: AC
  Administered 2015-06-27: 1 mg via ORAL

## 2015-06-27 MED ORDER — LORAZEPAM 1 MG PO TABS
ORAL_TABLET | ORAL | Status: AC
  Administered 2015-06-27: 1 mg via ORAL
  Filled 2015-06-27: qty 1

## 2015-06-27 NOTE — ED Notes (Addendum)
Pt to mri. In last few minutes, since her daughter in law called, pt has become anxious, tearing up and asking to be admitted. She has decided that the MD told her that her surgery is botched and that she will need to be admitted. This is not at all what the patient has been told. The patient was told that the EDP was consulting with her surgeon and that we are continuing to investigate what is going on with her hip. Pt is asking also to go back to the rehab she is in, because she doesn't like [place] or this place. Pt is also forgetting things that happened earlier and saying she is afraid of MRI. Pt given ativan PO for anxiety and sent to MRI after agreeing to go.

## 2015-06-27 NOTE — ED Notes (Signed)
Pt O2 sats in mid 80's. Repositioned pt and applied different O2 sensor with no change. Pt placed on 2L O2; current sat at 98%

## 2015-06-27 NOTE — ED Notes (Signed)
Pt via ems from [facility] with bleeding from surgical site. Pt had hip surgery in April with some revision in June. Festus reports that her bandage is being changed every hour. Assessment shows serosanguinous drainage but no havy bleeding. Pt alert & oriented with warm, dry skin. NAD noted.

## 2015-06-27 NOTE — ED Provider Notes (Signed)
-----------------------------------------   5:55 PM on 06/27/2015 -----------------------------------------  Care was assumed from Dr. Joni Fears at approximately 3 PM pending MRI which shows chronic/stable changes. Case was discussed extensively with Dr. Rudene Christians by Dr. Joni Fears. Dr. Rudene Christians recommended continued dressing changes, follow-up. I discussed this with the patient. Will dc back to [facility].  Joanne Gavel, MD 06/27/15 2259

## 2015-06-27 NOTE — ED Notes (Signed)
Pt discharged home after caregiver's verbalizing understanding of discharge instructions; copy sent to assisted living center. nad noted.

## 2015-06-27 NOTE — ED Provider Notes (Addendum)
[facility] Emergency Department Provider Note  ____________________________________________  Time seen: 12:00 PM on arrival by EMS  I have reviewed the triage vital signs and the nursing notes.   HISTORY  Chief Complaint Post-op Problem    HPI Sabrina Duncan is a 74 y.o. female who sent to the ED from [facility] due to bleeding from her surgical wound. She has recently had a total right hip replacement that has been complicated by persistent bleeding from the wound causing episodes of anemia and hypotension for which she has had hospitalizations in transfusions. This is been managed frequently in clinic as well by surgery including wound vacs and repeat suturing.  No fever chills chest pain shortness of breath nausea vomiting diarrhea or syncope.      Past Medical History  Diagnosis Date  . Depressive disorder, not elsewhere classified   . Unspecified osteomyelitis, site unspecified   . Polymyalgia rheumatica   . Paralysis agitans   . Osteoarthrosis, unspecified whether generalized or localized, unspecified site   . Muscle weakness (generalized)   . Difficulty in walking(719.7)   . Parkinson disease   . PMR (polymyalgia rheumatica)   . Coronary atherosclerosis of unspecified type of vessel, native or graft     2007: LAD PCI with a BMS, 80% distal LCX stenosis treated medically. Most recent stress test in 04/2012 showed distal anterio/apical scar? with no ischemia, EF 53%.   [name] Unspecified essential hypertension   . Other and unspecified hyperlipidemia   . Thoracic ascending aortic aneurysm     4.0 cm  . Complication of anesthesia     2013- hallucinations- [facility]. (-spinal anesth. 2013- no problems   redo- hip- L)  . Esophageal reflux     pt. reports that its resolved   . Neuromuscular disorder     parkinson's - [facility] - neurologist   . Cancer     left breast cancer- lumpectomy, chemo, radiation, tamoxifen  , SLNBx  . Anemia, unspecified     bld. transfusion- 02/2013    Patient Active Problem List   Diagnosis Date Noted  . Pressure ulcer 06/18/2015  . Hyponatremia 06/17/2015  . Arthritis 06/13/2015  . Benign breast lumps 06/13/2015  . Clinical depression 06/13/2015  . Hemorrhoid 06/13/2015  . Protein-calorie malnutrition, severe 05/28/2015  . Anemia 05/27/2015  . Acute blood loss anemia 05/14/2015  . Hematoma 05/14/2015  . Parkinson's disease 05/14/2015  . Generalized anxiety disorder 05/14/2015  . Coronary atherosclerosis   . Essential hypertension   . Other and unspecified hyperlipidemia   . Thoracic ascending aortic aneurysm     Past Surgical History  Procedure Laterality Date  . Knee surgery Left   . Varicose vein surgery      both legs   . Melanoma excision Right     elbow  . Knee surgery Left   . Hip replacement Bilateral   . Foot surgery Right 2013  . Rotator cuff repair Right   . Cardiac catheterization  2007    [place], [place]; x1 stent  . Cardiac catheterization  9/14    [facility]  . Breast lumpectomy Left 1997  . Joint replacement      L-hipx5, R hipx2, Lkneex1,   . Tonsillectomy    . Cholecystectomy  2005    open repair - for gangernous   . Peripherally inserted central catheter insertion      for IV antibiotics related to repeated infection, post hip replacement  . Reverse shoulder  arthroplasty Right 09/29/2013    Procedure: REVERSE SHOULDER ARTHROPLASTY;  Surgeon: Nita Sells, MD;  Location: [place];  Service: Orthopedics;  Laterality: Right;  Right reverse total shoulder  . Hematoma evacuation Right 05/11/2015    Procedure: EVACUATION HEMATOMA/RIGHT HIP HEMATOMA DRAINAGE WITH WOUND VAC;  Surgeon: Hessie Knows, MD;  Location: [facility];  Service: Orthopedics;  Laterality: Right;  . Incision and drainage hip Right 06/19/2015    Procedure: IRRIGATION AND DEBRIDEMENT HIP;  Surgeon: Hessie Knows, MD;  Location: [facility];  Service: Orthopedics;  Laterality:  Right;  hemovac placement    Current Outpatient Rx  Name  Route  Sig  Dispense  Refill  . acetaminophen (TYLENOL) 325 MG tablet   Oral   Take 2 tablets (650 mg total) by mouth every 6 (six) hours as needed for mild pain (or Fever >/= 101).   30 tablet   0   . Amino Acids-Protein Hydrolys (FEEDING SUPPLEMENT, PRO-STAT SUGAR FREE 64,) LIQD   Oral   Take 30 mLs by mouth 2 (two) times daily between meals.         . ARIPiprazole (ABILIFY) 5 MG tablet   Oral   Take 1 tablet (5 mg total) by mouth daily.   30 tablet   0   . atorvastatin (LIPITOR) 20 MG tablet   Oral   Take 20 mg by mouth at bedtime.          [name] buPROPion (WELLBUTRIN) 100 MG tablet   Oral   Take 100 mg by mouth daily.         . carbidopa-levodopa (SINEMET CR) 50-200 MG per tablet   Oral   Take 2 tablets by mouth 4 (four) times daily.         . carbidopa-levodopa-entacapone (STALEVO) 50-200-200 MG per tablet   Oral   Take 1 tablet by mouth at bedtime.          . Cholecalciferol (VITAMIN D3) 1000 UNITS CAPS   Oral   Take 2 capsules by mouth daily.          . clindamycin (CLEOCIN) 300 MG capsule   Oral   Take 1 capsule (300 mg total) by mouth 2 (two) times daily.   60 capsule   3   . collagenase (SANTYL) ointment   Topical   Apply topically daily.   15 g   0   . docusate sodium 100 MG CAPS   Oral   Take 100 mg by mouth 2 (two) times daily.   30 capsule   0   . ferrous fumarate (HEMOCYTE - 106 MG FE) 325 (106 FE) MG TABS tablet   Oral   Take 1 tablet (106 mg of iron total) by mouth 2 (two) times daily. Patient not taking: Reported on 06/13/2015   30 each   0   . LORazepam (ATIVAN) 0.5 MG tablet   Oral   Take 0.5 mg by mouth every 8 (eight) hours as needed for anxiety.         . metroNIDAZOLE (FLAGYL) 250 MG tablet   Oral   Take 1 tablet (250 mg total) by mouth every 8 (eight) hours.   30 tablet   0   . polyethylene glycol powder (GLYCOLAX/MIRALAX) powder   Oral   Take 17 g  by mouth daily as needed for mild constipation or moderate constipation.         . QUEtiapine (SEROQUEL) 25 MG tablet   Oral   Take 75 mg  by mouth at bedtime.          [name] QUEtiapine (SEROQUEL) 50 MG tablet   Oral   Take 50 mg by mouth daily.          . rifampin (RIFADIN) 300 MG capsule   Oral   Take 300 mg by mouth 2 (two) times daily.         [name] rOPINIRole (REQUIP) 2 MG tablet   Oral   Take 2 mg by mouth 3 (three) times daily.         [name] senna (SENOKOT) 8.6 MG TABS tablet   Oral   Take 1 tablet (8.6 mg total) by mouth daily as needed for mild constipation. Patient taking differently: Take 1 tablet by mouth daily as needed for mild constipation.    120 each   0   . traMADol (ULTRAM) 50 MG tablet   Oral   Take 1 tablet (50 mg total) by mouth every 6 (six) hours as needed for moderate pain.   30 tablet   0   . venlafaxine XR (EFFEXOR-XR) 150 MG 24 hr capsule   Oral   Take 150 mg by mouth daily with breakfast.          . vitamin B-12 (CYANOCOBALAMIN) 1000 MCG tablet   Oral   Take 1,000 mcg by mouth daily.            Allergies Morphine and related; Oxycodone; Sulfa antibiotics; Tazobactam; Adhesive; and Isosorbide  Family History  Problem Relation Age of Onset  . Hypertension Mother     Social History History  Substance Use Topics  . Smoking status: Former Smoker -- 0.50 packs/day for 10 years    Types: Cigarettes    Quit date: 09/26/1984  . Smokeless tobacco: Not on file  . Alcohol Use: Yes     Comment: rarely    Review of Systems  Constitutional: No fever or chills. No weight changes Eyes:No blurry vision or double vision.  ENT: No sore throat. Cardiovascular: No chest pain. Respiratory: No dyspnea or cough. Gastrointestinal: Negative for abdominal pain, vomiting and diarrhea.  No BRBPR or melena. Genitourinary: Negative for dysuria, urinary retention, bloody urine, or difficulty urinating. Musculoskeletal: Right hip wound drainage, or if  pain. Skin: Negative for rash. Neurological: Negative for headaches, focal weakness or numbness. Psychiatric:No anxiety or depression.   Endocrine:No hot/cold intolerance, changes in energy, or sleep difficulty.  10-point ROS otherwise negative.  ____________________________________________   PHYSICAL EXAM:  VITAL SIGNS: ED Triage Vitals  Enc Vitals Group     BP 06/27/15 1207 105/54 mmHg     Pulse Rate 06/27/15 1207 73     Resp 06/27/15 1207 17     Temp 06/27/15 1207 97.7 F (36.5 C)     Temp Source 06/27/15 1207 Oral     SpO2 06/27/15 1207 98 %     Weight 06/27/15 1207 113 lb 12.1 oz (51.6 kg)     Height 06/27/15 1207 5' 6"  (1.676 m)     Head Cir --      [facility] --      Pain Score --      Pain Loc --      Pain Edu? --      Excl. in [place]? --      Constitutional: Alert and oriented. Well appearing and in no distress. Eyes: No scleral icterus. No conjunctival pallor. PERRL. EOMI ENT   Head: Normocephalic and atraumatic.   Nose: No congestion/rhinnorhea. No septal hematoma  Mouth/Throat: MMM, no pharyngeal erythema. No peritonsillar mass. No uvula shift.   Neck: No stridor. No SubQ emphysema. No meningismus. Hematological/Lymphatic/Immunilogical: No cervical lymphadenopathy. Cardiovascular: RRR. Normal and symmetric distal pulses are present in all extremities. No murmurs, rubs, or gallops. Respiratory: Normal respiratory effort without tachypnea nor retractions. Breath sounds are clear and equal bilaterally. No wheezes/rales/rhonchi. Gastrointestinal: Soft and nontender. No distention. There is no CVA tenderness.  No rebound, rigidity, or guarding. Genitourinary: deferred Musculoskeletal: Nontender with normal range of motion in all extremities. No apparent joint effusions.  No lower extremity tenderness.  No edema. There is a approximately 6 cm surgical incision wound in the right lateral thigh that has been closed with sutures. It does not appear to be  healing together, and there is serosanguineous drainage from the wound. There is no induration erythema or tenderness or warmth. The drainage is not purulent. No fluctuance. Neurologic:   Normal speech and language.  CN 2-10 normal. Motor grossly intact. No pronator drift.  Normal gait. No gross focal neurologic deficits are appreciated.  Skin:  Skin is warm, dry and intact. No rash noted.  No petechiae, purpura, or bullae. Pale skin Psychiatric: Mood and affect are normal. Speech and behavior are normal. Patient exhibits appropriate insight and judgment.  ____________________________________________    LABS (pertinent positives/negatives) (all labs ordered are listed, but only abnormal results are displayed) Labs Reviewed  BASIC METABOLIC PANEL - Abnormal; Notable for the following:    Calcium 7.6 (*)    All other components within normal limits  CBC WITH DIFFERENTIAL/PLATELET - Abnormal; Notable for the following:    RBC 2.75 (*)    Hemoglobin 7.7 (*)    HCT 24.2 (*)    MCHC 31.9 (*)    RDW 19.9 (*)    Lymphs Abs 0.6 (*)    All other components within normal limits  PROTIME-INR - Abnormal; Notable for the following:    Prothrombin Time 15.2 (*)    All other components within normal limits  SEDIMENTATION RATE - Abnormal; Notable for the following:    Sed Rate 126 (*)    All other components within normal limits   ____________________________________________   EKG    ____________________________________________    RADIOLOGY  X-ray right hip reveals prosthesis in good position. There is some bony erosion of the proximal femur about the hardware, suggestive of either resorption or possible osteomyelitis  ____________________________________________   PROCEDURES  ____________________________________________   INITIAL IMPRESSION / ASSESSMENT AND PLAN / ED COURSE  Pertinent labs & imaging results that were available during my care of the patient were reviewed by  me and considered in my medical decision making (see chart for details).  Patient presents with chronic wound drainage that is serosanguineous. Vital signs are normal. No evidence of septic arthritis necrotizing fasciitis Cellulitis or abscess. Patient is nontoxic and nonseptic. We'll check x-ray and labs. ----------------------------------------- 3:15 PM on 06/27/2015 -----------------------------------------   ESR markedly elevated. That combined with the x-ray is suggestive of possible osteomyelitis as the source of these chronic issues and the poor wound healing and persistent drainage. This may also be due to a chronic seroma. Dr. Rudene Christians from orthopedics has been paged but has not yet called back. We'll proceed with MRI of the right hip to evaluate for osteomyelitis while waiting for orthopedic consultation. Care of the patient is signed out to oncoming physician Dr. Edd Fabian will follow-up for disposition.  ____________________________________________   FINAL CLINICAL IMPRESSION(S) / ED DIAGNOSES  Final diagnoses:  Wound drainage  Hip pain, acute, right      Carrie Mew, MD 06/27/15 1516  ----------------------------------------- 3:33 PM on 06/27/2015 -----------------------------------------  Discussed with Dr. Rudene Christians. He notes that the wound is known to communicate with the joint space and that the drainage is synovial fluid. He reports this is likely to be a chronically on healing wound and that it should just be managed with dressing changes and continued follow-up in orthopedic clinic. Pending MRI, the patient is suitable for discharge home and outpatient follow-up. If MRI cannot be obtained today, there is low suspicion for osteomyelitis based on Dr. Theodore Demark extensive knowledge of this patient's wound and issues with her hip, so it could be deferred to a later outpatient date.  Carrie Mew, MD 06/27/15 1534

## 2015-06-28 ENCOUNTER — Inpatient Hospital Stay
Admission: EM | Admit: 2015-06-28 | Discharge: 2015-07-04 | DRG: 919 | Disposition: A | Discharge status: Other Acute Inpatient Hospital | Payer: Medicare Other | Attending: Internal Medicine | Admitting: Internal Medicine

## 2015-06-28 ENCOUNTER — Encounter: Payer: Self-pay | Admitting: Medical Oncology

## 2015-06-28 DIAGNOSIS — F05 Delirium due to known physiological condition: Secondary | ICD-10-CM

## 2015-06-28 DIAGNOSIS — R4182 Altered mental status, unspecified: Secondary | ICD-10-CM

## 2015-06-28 DIAGNOSIS — D62 Acute posthemorrhagic anemia: Secondary | ICD-10-CM

## 2015-06-28 DIAGNOSIS — R0602 Shortness of breath: Secondary | ICD-10-CM

## 2015-06-28 DIAGNOSIS — E785 Hyperlipidemia, unspecified: Secondary | ICD-10-CM | POA: Diagnosis present

## 2015-06-28 DIAGNOSIS — T8189XD Other complications of procedures, not elsewhere classified, subsequent encounter: Secondary | ICD-10-CM

## 2015-06-28 DIAGNOSIS — F333 Major depressive disorder, recurrent, severe with psychotic symptoms: Secondary | ICD-10-CM

## 2015-06-28 DIAGNOSIS — R410 Disorientation, unspecified: Secondary | ICD-10-CM

## 2015-06-28 DIAGNOSIS — G20 Parkinson's disease: Secondary | ICD-10-CM | POA: Diagnosis present

## 2015-06-28 DIAGNOSIS — I2510 Atherosclerotic heart disease of native coronary artery without angina pectoris: Secondary | ICD-10-CM | POA: Diagnosis present

## 2015-06-28 DIAGNOSIS — I10 Essential (primary) hypertension: Secondary | ICD-10-CM | POA: Diagnosis present

## 2015-06-28 DIAGNOSIS — R4189 Other symptoms and signs involving cognitive functions and awareness: Secondary | ICD-10-CM

## 2015-06-28 DIAGNOSIS — F411 Generalized anxiety disorder: Secondary | ICD-10-CM | POA: Diagnosis present

## 2015-06-28 DIAGNOSIS — D649 Anemia, unspecified: Secondary | ICD-10-CM | POA: Diagnosis present

## 2015-06-28 DIAGNOSIS — T8189XA Other complications of procedures, not elsewhere classified, initial encounter: Secondary | ICD-10-CM | POA: Diagnosis present

## 2015-06-28 LAB — CBC WITH DIFFERENTIAL/PLATELET
BASOS PCT: 1 %
Basophils Absolute: 0.1 10*3/uL (ref 0–0.1)
Eosinophils Absolute: 0.1 10*3/uL (ref 0–0.7)
Eosinophils Relative: 2 %
HCT: 23.4 % — ABNORMAL LOW (ref 35.0–47.0)
Hemoglobin: 7.3 g/dL — ABNORMAL LOW (ref 12.0–16.0)
Lymphocytes Relative: 9 %
Lymphs Abs: 0.8 10*3/uL — ABNORMAL LOW (ref 1.0–3.6)
MCH: 27.9 pg (ref 26.0–34.0)
MCHC: 31.4 g/dL — AB (ref 32.0–36.0)
MCV: 88.9 fL (ref 80.0–100.0)
Monocytes Absolute: 0.6 10*3/uL (ref 0.2–0.9)
Monocytes Relative: 7 %
NEUTROS ABS: 7.1 10*3/uL — AB (ref 1.4–6.5)
NEUTROS PCT: 81 %
Platelets: 436 10*3/uL (ref 150–440)
RBC: 2.63 MIL/uL — AB (ref 3.80–5.20)
RDW: 20.4 % — ABNORMAL HIGH (ref 11.5–14.5)
WBC: 8.6 10*3/uL (ref 3.6–11.0)

## 2015-06-28 MED ORDER — METRONIDAZOLE 500 MG PO TABS
250.0000 mg | ORAL_TABLET | Freq: Three times a day (TID) | ORAL | Status: DC
  Administered 2015-06-28 – 2015-07-04 (×17): 250 mg via ORAL
  Filled 2015-06-28 (×6): qty 1
  Filled 2015-06-28: qty 2
  Filled 2015-06-28 (×10): qty 1

## 2015-06-28 MED ORDER — TRAMADOL HCL 50 MG PO TABS
50.0000 mg | ORAL_TABLET | Freq: Four times a day (QID) | ORAL | Status: DC | PRN
  Administered 2015-06-28 – 2015-07-01 (×5): 50 mg via ORAL
  Filled 2015-06-28 (×5): qty 1

## 2015-06-28 MED ORDER — VENLAFAXINE HCL ER 75 MG PO CP24
150.0000 mg | ORAL_CAPSULE | Freq: Two times a day (BID) | ORAL | Status: DC
  Administered 2015-06-28 – 2015-07-02 (×8): 150 mg via ORAL
  Filled 2015-06-28 (×8): qty 2

## 2015-06-28 MED ORDER — ASPIRIN 81 MG PO CHEW: 81.0000 mg | CHEWABLE_TABLET | Freq: Every day | ORAL | Status: DC

## 2015-06-28 MED ORDER — SENNOSIDES-DOCUSATE SODIUM 8.6-50 MG PO TABS
1.0000 | ORAL_TABLET | Freq: Every evening | ORAL | Status: DC | PRN
  Filled 2015-06-28: qty 1

## 2015-06-28 MED ORDER — ARIPIPRAZOLE 5 MG PO TABS
5.0000 mg | ORAL_TABLET | Freq: Two times a day (BID) | ORAL | Status: DC
  Administered 2015-06-28 – 2015-07-02 (×8): 5 mg via ORAL
  Filled 2015-06-28 (×8): qty 1

## 2015-06-28 MED ORDER — ROPINIROLE HCL 1 MG PO TABS
2.0000 mg | ORAL_TABLET | Freq: Three times a day (TID) | ORAL | Status: DC
  Administered 2015-06-28 – 2015-07-03 (×15): 2 mg via ORAL
  Filled 2015-06-28 (×4): qty 2
  Filled 2015-06-28: qty 1
  Filled 2015-06-28 (×12): qty 2

## 2015-06-28 MED ORDER — ATORVASTATIN CALCIUM 20 MG PO TABS
20.0000 mg | ORAL_TABLET | Freq: Every day | ORAL | Status: DC
  Administered 2015-06-28 – 2015-07-03 (×6): 20 mg via ORAL
  Filled 2015-06-28 (×6): qty 1

## 2015-06-28 MED ORDER — CARBIDOPA-LEVODOPA ER 50-200 MG PO TBCR
2.0000 | EXTENDED_RELEASE_TABLET | Freq: Four times a day (QID) | ORAL | Status: DC
  Administered 2015-06-28 – 2015-07-04 (×21): 2 via ORAL
  Filled 2015-06-28 (×22): qty 2

## 2015-06-28 MED ORDER — ASPIRIN 81 MG PO CHEW
81.0000 mg | CHEWABLE_TABLET | Freq: Every day | ORAL | Status: DC
  Administered 2015-07-01 – 2015-07-04 (×3): 81 mg via ORAL
  Filled 2015-06-28 (×5): qty 1

## 2015-06-28 MED ORDER — CLINDAMYCIN HCL 150 MG PO CAPS
300.0000 mg | ORAL_CAPSULE | Freq: Two times a day (BID) | ORAL | Status: DC
  Administered 2015-06-28 – 2015-07-04 (×12): 300 mg via ORAL
  Filled 2015-06-28 (×12): qty 2

## 2015-06-28 MED ORDER — ACETAMINOPHEN 325 MG PO TABS
650.0000 mg | ORAL_TABLET | Freq: Four times a day (QID) | ORAL | Status: DC | PRN
  Administered 2015-07-02: 650 mg via ORAL
  Filled 2015-06-28: qty 2

## 2015-06-28 MED ORDER — METOPROLOL TARTRATE 50 MG PO TABS
50.0000 mg | ORAL_TABLET | Freq: Two times a day (BID) | ORAL | Status: DC
  Administered 2015-06-29 – 2015-07-03 (×5): 50 mg via ORAL
  Filled 2015-06-28 (×9): qty 1

## 2015-06-28 MED ORDER — RIFAMPIN 300 MG PO CAPS
300.0000 mg | ORAL_CAPSULE | Freq: Two times a day (BID) | ORAL | Status: DC
  Administered 2015-06-28 – 2015-07-04 (×12): 300 mg via ORAL
  Filled 2015-06-28 (×12): qty 1

## 2015-06-28 NOTE — ED Notes (Addendum)
Pt cleaned and changed in clean underwear/clothing change and applied pressure dressing to left elbow and sacral areas. Pt tolerated well. No further needs at this time. Will continue to monitor.

## 2015-06-28 NOTE — Care Management (Signed)
Plan for Dr. Rudene Christians to place wound vac.  Wound vac from [employer] to be delivered to bed side.  If patient is admitted to hospital, hospital wound vac must be used NOT home wound vac.  If patient stays in ED awaiting placement, then home wound vac may be used.  Please call Ricky from [facility] with questions directly related to wound VAC [phone].  Marya Amsler Agricultural consultant, and patient RN Becky Sax made aware

## 2015-06-28 NOTE — Clinical Social Work Note (Signed)
Clinical Social Work Assessment  Patient Details  Name: Sabrina Duncan MRN: [id number] Date of Birth: 06-05-1941  Date of referral:  06/28/15               Reason for consult:  Facility Placement                Permission sought to share information with:  Family Supports Permission granted to share information::  Yes, Verbal Permission Granted  Name::     Narda Rutherford  Agency::     Relationship::  daughter in Financial trader Information:  [id number]  Housing/Transportation Living arrangements for the past 2 months:  [place] of Information:    Patient Interpreter Needed:  None Criminal Activity/Legal Involvement Pertinent to Current Situation/Hospitalization:  No - Comment as needed Significant Relationships:  Adult Children Lives with:  Facility Resident Do you feel safe going back to the place where you live?  Yes Need for family participation in patient care:  Yes (Comment)  Care giving concerns:  Patient will have a wound vac procedure and needing short term stay with SNF for care.     Social Worker assessment / plan:  CSW met with the patient at bedside to complete this assessment.  Patient presents as oriented x3, calm, and cooperative.  Patient was able to answer questions but requested this writer coordinate with her daughter in law.  CSW spoke with Sabrina Duncan(daughter in law) [phone] about potential placement.  She reports being aware of the need for short term placement and would appreciate no referrals to the following facilities [facility] and [facility].  She requested placement with [facility] affiliated facilities.  CSW informed the daughter in law the referral was sent to multiple facilities and no guarantee can be made.    Employment status:  Disabled (Comment on whether or not currently receiving Disability) Insurance information:  Medicare PT Recommendations:    Information / Referral to community resources:  Acute Rehab  Patient/Family's  Response to care:  Agrees with recommendations  Patient/Family's Understanding of and Emotional Response to Diagnosis, Current Treatment, and Prognosis:  Agrees with recommendations  Emotional Assessment Appearance:  Appears stated age Attitude/Demeanor/Rapport:  Other (calm and cooperative) Affect (typically observed):  Pleasant Orientation:  Oriented to Self, Oriented to Place, Oriented to Situation Alcohol / Substance use:  Never Used Psych involvement (Current and /or in the community):  No (Comment)  Discharge Needs  Concerns to be addressed:  Discharge Planning Concerns Readmission within the last 30 days:  Yes Current discharge risk:  Physical Impairment Barriers to Discharge:  Other (Needing Wound Vac Care)   Edmund Hilda, LCSW 06/28/2015, 6:06 PM

## 2015-06-28 NOTE — Discharge Instructions (Signed)

## 2015-06-28 NOTE — ED Notes (Signed)
Wound vac is in place. Wound vac placed by Dr. Rudene Christians. Patient tolerated procedure well.

## 2015-06-28 NOTE — Progress Notes (Signed)
CSW spoke with Maudie Mercury with [facility] and they are not able to accept this patient at this time due to a lack of bed availability.  CSW called and left message with [facility] but no response at this time.     CSW met with the family at the room to update them on the progress with placement and the follow up in the morning by Casimer Lanius, CSW.  Family is aware and would like to be kept updated.      Chesley Noon, MSW, LCSW, [place] Clinical Social Worker [phone]

## 2015-06-28 NOTE — ED Provider Notes (Addendum)
[facility] Emergency Department Provider Note  ____________________________________________  Time seen: 10:15 AM  I have reviewed the triage vital signs and the nursing notes.   HISTORY  Chief Complaint Post-op Problem    HPI Sabrina Duncan is a 74 y.o. 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 There is concern from [facility] that she was having severe bleeding from the wound. Her caregiver showed me photos on her cell phone which shows a thin serosanguineous fluid drainage that is soaking Chux and wound pads. The patient has no new complaints such as chest pain shortness of breath dizziness or passing out.  Patient was seen in the ED yesterday by myself and had an extensive evaluation including labs and x-ray and MRI of the hip which did not reveal any additional concerns. They were advised to follow up in clinic with Dr. Rudene Christians for continued evaluation and treatment of the chronic wound. They come back today in part because the patient is very frustrated and feels she cannot live this way. They report thatthey spoke with Dr. Rudene Christians this morning and he plans to place a wound VAC today.  No new pain or fever.   Past Medical History  Diagnosis Date  . Depressive disorder, not elsewhere classified   . Unspecified osteomyelitis, site unspecified   . Polymyalgia rheumatica   . Paralysis agitans   . Osteoarthrosis, unspecified whether generalized or localized, unspecified site   . Muscle weakness (generalized)   . Difficulty in walking(719.7)   . Parkinson disease   . PMR (polymyalgia rheumatica)   . Coronary atherosclerosis of unspecified type of vessel, native or graft     2007: LAD PCI with a BMS, 80% distal LCX stenosis treated medically. Most recent stress test in 04/2012 showed distal anterio/apical scar? with no ischemia, EF 53%.   [name] Unspecified essential hypertension    . Other and unspecified hyperlipidemia   . Thoracic ascending aortic aneurysm     4.0 cm  . Complication of anesthesia     2013- hallucinations- [facility]. (-spinal anesth. 2013- no problems   redo- hip- L)  . Esophageal reflux     pt. reports that its resolved   . Neuromuscular disorder     parkinson's - [facility] - neurologist   . Cancer     left breast cancer- lumpectomy, chemo, radiation, tamoxifen , SLNBx  . Anemia, unspecified     bld. transfusion- 02/2013    Patient Active Problem List   Diagnosis Date Noted  . Pressure ulcer 06/18/2015  . Hyponatremia 06/17/2015  . Arthritis 06/13/2015  . Benign breast lumps 06/13/2015  . Clinical depression 06/13/2015  . Hemorrhoid 06/13/2015  . Protein-calorie malnutrition, severe 05/28/2015  . Anemia 05/27/2015  . Acute blood loss anemia 05/14/2015  . Hematoma 05/14/2015  . Parkinson's disease 05/14/2015  . Generalized anxiety disorder 05/14/2015  . Coronary atherosclerosis   . Essential hypertension   . Other and unspecified hyperlipidemia   . Thoracic ascending aortic aneurysm     Past Surgical History  Procedure Laterality Date  . Knee surgery Left   . Varicose vein surgery      both legs   . Melanoma excision Right     elbow  . Knee surgery Left   . Hip replacement Bilateral   . Foot surgery Right 2013  . Rotator cuff repair Right   . Cardiac catheterization  2007  [place], [place]; x1 stent  . Cardiac catheterization  9/14    [facility]  . Breast lumpectomy Left 1997  . Joint replacement      L-hipx5, R hipx2, Lkneex1,   . Tonsillectomy    . Cholecystectomy  2005    open repair - for gangernous   . Peripherally inserted central catheter insertion      for IV antibiotics related to repeated infection, post hip replacement  . Reverse shoulder arthroplasty Right 09/29/2013    Procedure: REVERSE SHOULDER ARTHROPLASTY;  Surgeon: Nita Sells, MD;  Location: [facility];  Service: Orthopedics;   Laterality: Right;  Right reverse total shoulder  . Hematoma evacuation Right 05/11/2015    Procedure: EVACUATION HEMATOMA/RIGHT HIP HEMATOMA DRAINAGE WITH WOUND VAC;  Surgeon: Hessie Knows, MD;  Location: [facility];  Service: Orthopedics;  Laterality: Right;  . Incision and drainage hip Right 06/19/2015    Procedure: IRRIGATION AND DEBRIDEMENT HIP;  Surgeon: Hessie Knows, MD;  Location: [facility];  Service: Orthopedics;  Laterality: Right;  hemovac placement    Current Outpatient Rx  Name  Route  Sig  Dispense  Refill  . ARIPiprazole (ABILIFY) 5 MG tablet   Oral   Take 1 tablet (5 mg total) by mouth daily. Patient taking differently: Take 5 mg by mouth 2 (two) times daily.    30 tablet   0   . aspirin 81 MG chewable tablet   Oral   Chew 81 mg by mouth daily.         [name] atorvastatin (LIPITOR) 20 MG tablet   Oral   Take 20 mg by mouth at bedtime.          . carbidopa-levodopa (SINEMET CR) 50-200 MG per tablet   Oral   Take 2 tablets by mouth 4 (four) times daily.         . carbidopa-levodopa-entacapone (STALEVO) 50-200-200 MG per tablet   Oral   Take 1 tablet by mouth every evening.          . Cholecalciferol (RA VITAMIN D-3) 2000 UNITS CAPS   Oral   Take 1 capsule by mouth daily.         . clindamycin (CLEOCIN) 300 MG capsule   Oral   Take 300 mg by mouth 2 (two) times daily.         [name] docusate sodium 100 MG CAPS   Oral   Take 100 mg by mouth 2 (two) times daily.   30 capsule   0   . Ferrous Fumarate (FERROCITE) 324 (106 FE) MG TABS   Oral   Take 1 tablet by mouth 2 (two) times daily.         . metoprolol (LOPRESSOR) 50 MG tablet   Oral   Take 50 mg by mouth 2 (two) times daily.         . metroNIDAZOLE (FLAGYL) 250 MG tablet   Oral   Take 1 tablet (250 mg total) by mouth every 8 (eight) hours.   30 tablet   0   . rifampin (RIFADIN) 300 MG capsule   Oral   Take 300 mg by mouth 2 (two) times daily.         [name] rOPINIRole (REQUIP) 2 MG tablet    Oral   Take 2 mg by mouth 3 (three) times daily.         [name] venlafaxine XR (EFFEXOR-XR) 150 MG 24 hr capsule   Oral   Take 150 mg by mouth 2 (two)  times daily.          . vitamin B-12 (CYANOCOBALAMIN) 1000 MCG tablet   Oral   Take 1,000 mcg by mouth daily.          [name] acetaminophen (TYLENOL) 325 MG tablet   Oral   Take 2 tablets (650 mg total) by mouth every 6 (six) hours as needed for mild pain (or Fever >/= 101).   30 tablet   0   . collagenase (SANTYL) ointment   Topical   Apply topically daily. Patient not taking: Reported on 06/27/2015   15 g   0   . ferrous fumarate (HEMOCYTE - 106 MG FE) 325 (106 FE) MG TABS tablet   Oral   Take 1 tablet (106 mg of iron total) by mouth 2 (two) times daily. Patient not taking: Reported on 06/13/2015   30 each   0   . senna (SENOKOT) 8.6 MG TABS tablet   Oral   Take 1 tablet (8.6 mg total) by mouth daily as needed for mild constipation. Patient not taking: Reported on 06/27/2015   120 each   0   . traMADol (ULTRAM) 50 MG tablet   Oral   Take 1 tablet (50 mg total) by mouth every 6 (six) hours as needed for moderate pain. Patient taking differently: Take 50 mg by mouth 3 (three) times daily as needed for moderate pain.    30 tablet   0     Allergies Morphine and related; Oxycodone; Sulfa antibiotics; Tazobactam; Adhesive; and Isosorbide  Family History  Problem Relation Age of Onset  . Hypertension Mother     Social History History  Substance Use Topics  . Smoking status: Former Smoker -- 0.50 packs/day for 10 years    Types: Cigarettes    Quit date: 09/26/1984  . Smokeless tobacco: Not on file  . Alcohol Use: Yes     Comment: rarely    Review of Systems  Constitutional: No fever or chills. No weight changes Eyes:No blurry vision or double vision.  ENT: No sore throat. Cardiovascular: No chest pain. Respiratory: No dyspnea or cough. Gastrointestinal: Negative for abdominal pain, vomiting and diarrhea.  No  BRBPR or melena. Genitourinary: Negative for dysuria, urinary retention, bloody urine, or difficulty urinating. Musculoskeletal: Negative for back pain. No joint swelling or pain. Chronic nonhealing draining wound Skin: Negative for rash. Neurological: Negative for headaches, focal weakness or numbness. Psychiatric:No anxiety or depression.   Endocrine:No hot/cold intolerance, changes in energy, or sleep difficulty.  10-point ROS otherwise negative.  ____________________________________________   PHYSICAL EXAM:  VITAL SIGNS: ED Triage Vitals  Enc Vitals Group     BP 06/28/15 1014 115/67 mmHg     Pulse Rate 06/28/15 1014 102     Resp 06/28/15 1014 21     Temp 06/28/15 1014 97.5 F (36.4 C)     Temp Source 06/28/15 1014 Oral     SpO2 06/28/15 1014 100 %     Weight 06/28/15 1014 122 lb (55.339 kg)     Height 06/28/15 1014 5\' 6"  (1.676 m)     Head Cir --      Peak Flow --      Pain Score 06/28/15 1015 6     Pain Loc --      Pain Edu? --      Excl. in [place]? --      Constitutional: Alert and oriented. Well appearing and in no distress. Eyes: No scleral icterus. No conjunctival pallor. PERRL. EOMI  ENT   Head: Normocephalic and atraumatic.   Nose: No congestion/rhinnorhea. No septal hematoma   Mouth/Throat: MMM, no pharyngeal erythema. No peritonsillar mass. No uvula shift.   Neck: No stridor. No SubQ emphysema. No meningismus. Hematological/Lymphatic/Immunilogical: No cervical lymphadenopathy. Cardiovascular: RRR. Normal and symmetric distal pulses are present in all extremities. No murmurs, rubs, or gallops. Respiratory: Normal respiratory effort without tachypnea nor retractions. Breath sounds are clear and equal bilaterally. No wheezes/rales/rhonchi. Gastrointestinal: Soft and nontender. No distention. There is no CVA tenderness.  No rebound, rigidity, or guarding. Genitourinary: deferred Musculoskeletal: Nontender with normal range of motion in all  extremities. No joint effusions.  No lower extremity tenderness.  No edema. There is again noted a approximately 6 cm linear surgical incision on the right lateral thigh. The wound edges are well approximated by sutures but nonhealing. There is persistent serosanguineous drainage from the wound. There is no evidence of acute hemorrhage or frank bleeding. No evidence of inflammation. Neurologic:   Normal speech and language.  CN 2-10 normal. Motor grossly intact. No pronator drift.  Normal gait. No gross focal neurologic deficits are appreciated.  Skin:  Skin is warm, dry and intact. No rash noted.  No petechiae, purpura, or bullae. Psychiatric: Mood and affect are normal. Speech and behavior are normal. Patient exhibits appropriate insight and judgment.  ____________________________________________    LABS (pertinent positives/negatives) (all labs ordered are listed, but only abnormal results are displayed) Labs Reviewed - No data to display ____________________________________________   EKG    ____________________________________________    RADIOLOGY    ____________________________________________   PROCEDURES  ____________________________________________   INITIAL IMPRESSION / ASSESSMENT AND PLAN / ED COURSE  Pertinent labs & imaging results that were available during my care of the patient were reviewed by me and considered in my medical decision making (see chart for details).  I discussed the patient's course extensively with the patient and her caregiver. The patient insists on being admitted to the hospital because she is frustrated and refuses to accept this chronic nonhealing wound. I also discussed the patient's care extensively with [facility] nursing administrator [place], including educating her on the nature of this drainage and what to expect in the future per my conversations with Dr. Rudene Christians yesterday and review of the records. I also discussed the patient's  case with Dr. Rudene Christians again today, and he will evaluate her when he is available for possible wound VAC placement today.  ____________________________________________   FINAL CLINICAL IMPRESSION(S) / ED DIAGNOSES  Final diagnoses:  Nonhealing surgical wound, subsequent encounter      Carrie Mew, MD 06/28/15 1205  Further discussion with case management and [facility] has recently issued that [facility] will be unable to care for the patient once the wound VAC is placed. Social work has been consult and is proceeding with nursing home placement for now. We are still awaiting Dr. Theodore Demark availability for evaluation and wound VAC placement in the ED. The patient is medically stable and suitable for discharge as soon as wound VAC is placed and nursing home arrangements can be made. Care is signed out to the oncoming physician Dr. Robet Leu to follow-up on these issues  Carrie Mew, MD 06/28/15 1455

## 2015-06-28 NOTE — Clinical Social Work Note (Signed)
CSW on unit received call from Mrs. Merchel, patient's daughter in law, [phone]. Mrs. Merchel stated that patient was back in the emergency room and that she is to understand that patient will now need a wound vac. Mrs. Merchel was concerned at this because [facility] is not able to accept patient back with a wound vac. CSW contacted Colletta [place], the RN CM in the ED to notify her as her current note states patient set up with wound vac and [place]. Shela Leff MSW,LCSWA [phone]

## 2015-06-28 NOTE — Progress Notes (Signed)
Spoke with Dr. Jannifer Franklin regarding blood pressure of 100/51. Md order to hold metoprolol.

## 2015-06-28 NOTE — H&P (Signed)
[facility] [place] NAME: Sabrina Duncan    MR#:  [id number]  DATE OF BIRTH:  [birth year]  DATE OF ADMISSION:  06/28/2015  PRIMARY CARE PHYSICIAN: SPARKS,JEFFREY D, MD   REQUESTING/REFERRING PHYSICIAN: [place]  CHIEF COMPLAINT:   Chief Complaint  Patient presents with  . Post-op Problem    HISTORY OF PRESENT ILLNESS:  Sabrina Duncan  is a 74 y.o. female who presents with repeated admission for acute blood loss anemia from a right hip hematoma related to multiple right hip surgeries and delayed wound healing. Patient is followed by orthopedic surgery for this issue and has had wound VAC placed several times has had to have multiple admissions due to the same. She had a wound VAC placed again today in the ED by orthopedic surgeon. Given that her hemoglobin is borderline towards requiring a transfusion she will be admitted for observation tonight, hospitalists were called for the same. She is currently on oral antibiotics, and denies any infectious symptoms at this time.  PAST MEDICAL HISTORY:   Past Medical History  Diagnosis Date  . Depressive disorder, not elsewhere classified   . Unspecified osteomyelitis, site unspecified   . Polymyalgia rheumatica   . Paralysis agitans   . Osteoarthrosis, unspecified whether generalized or localized, unspecified site   . Muscle weakness (generalized)   . Difficulty in walking(719.7)   . Parkinson disease   . PMR (polymyalgia rheumatica)   . Coronary atherosclerosis of unspecified type of vessel, native or graft     2007: LAD PCI with a BMS, 80% distal LCX stenosis treated medically. Most recent stress test in 04/2012 showed distal anterio/apical scar? with no ischemia, EF 53%.   [name] Unspecified essential hypertension   . Other and unspecified hyperlipidemia   . Thoracic ascending aortic aneurysm     4.0 cm  . Complication of anesthesia     2013- hallucinations- [facility]. (-spinal anesth. 2013- no problems   redo- hip- L)  . Esophageal reflux     pt. reports that its resolved   . Neuromuscular disorder     parkinson's - [facility] - neurologist   . Cancer     left breast cancer- lumpectomy, chemo, radiation, tamoxifen , SLNBx  . Anemia, unspecified     bld. transfusion- 02/2013    PAST SURGICAL HISTORY:   Past Surgical History  Procedure Laterality Date  . Knee surgery Left   . Varicose vein surgery      both legs   . Melanoma excision Right     elbow  . Knee surgery Left   . Hip replacement Bilateral   . Foot surgery Right 2013  . Rotator cuff repair Right   . Cardiac catheterization  2007    [place], [place]; x1 stent  . Cardiac catheterization  9/14    [facility]  . Breast lumpectomy Left 1997  . Joint replacement      L-hipx5, R hipx2, Lkneex1,   . Tonsillectomy    . Cholecystectomy  2005    open repair - for gangernous   . Peripherally inserted central catheter insertion      for IV antibiotics related to repeated infection, post hip replacement  . Reverse shoulder arthroplasty Right 09/29/2013    Procedure: REVERSE SHOULDER ARTHROPLASTY;  Surgeon: Nita Sells, MD;  Location: [facility];  Service: Orthopedics;  Laterality: Right;  Right reverse total shoulder  . Hematoma evacuation Right 05/11/2015    Procedure:  EVACUATION HEMATOMA/RIGHT HIP HEMATOMA DRAINAGE WITH WOUND VAC;  Surgeon: Hessie Knows, MD;  Location: [facility];  Service: Orthopedics;  Laterality: Right;  . Incision and drainage hip Right 06/19/2015    Procedure: IRRIGATION AND DEBRIDEMENT HIP;  Surgeon: Hessie Knows, MD;  Location: [facility];  Service: Orthopedics;  Laterality: Right;  hemovac placement    SOCIAL HISTORY:   History  Substance Use Topics  . Smoking status: Former Smoker -- 0.50 packs/day for 10 years    Types: Cigarettes    Quit date: 09/26/1984  . Smokeless tobacco: Not on file  . Alcohol Use: Yes     Comment: rarely    FAMILY HISTORY:   Family  History  Problem Relation Age of Onset  . Hypertension Mother     DRUG ALLERGIES:   Allergies  Allergen Reactions  . Morphine And Related Other (See Comments)    Reaction:  Hallucinations   . Oxycodone Other (See Comments)    Reaction:  Unknown   . Sulfa Antibiotics Itching  . Tazobactam Other (See Comments)    Reaction:  Unknown   . Adhesive [Tape] Rash  . Isosorbide Rash    MEDICATIONS AT HOME:   Prior to Admission medications   Medication Sig Start Date End Date Taking? Authorizing Provider  ARIPiprazole (ABILIFY) 5 MG tablet Take 1 tablet (5 mg total) by mouth daily. Patient taking differently: Take 5 mg by mouth 2 (two) times daily.  05/14/15  Yes Aldean Jewett, MD  aspirin 81 MG chewable tablet Chew 81 mg by mouth daily.   Yes Historical Provider, MD  atorvastatin (LIPITOR) 20 MG tablet Take 20 mg by mouth at bedtime.    Yes Historical Provider, MD  carbidopa-levodopa (SINEMET CR) 50-200 MG per tablet Take 2 tablets by mouth 4 (four) times daily.   Yes Historical Provider, MD  carbidopa-levodopa-entacapone (STALEVO) 50-200-200 MG per tablet Take 1 tablet by mouth every evening.    Yes Historical Provider, MD  Cholecalciferol (RA VITAMIN D-3) 2000 UNITS CAPS Take 1 capsule by mouth daily.   Yes Historical Provider, MD  clindamycin (CLEOCIN) 300 MG capsule Take 300 mg by mouth 2 (two) times daily.   Yes Historical Provider, MD  docusate sodium 100 MG CAPS Take 100 mg by mouth 2 (two) times daily. 10/03/13  Yes Tania Ade, MD  Ferrous Fumarate (FERROCITE) 324 (106 FE) MG TABS Take 1 tablet by mouth 2 (two) times daily.   Yes Historical Provider, MD  metoprolol (LOPRESSOR) 50 MG tablet Take 50 mg by mouth 2 (two) times daily.   Yes Historical Provider, MD  metroNIDAZOLE (FLAGYL) 250 MG tablet Take 1 tablet (250 mg total) by mouth every 8 (eight) hours. 06/24/15 07/04/15 Yes Aldean Jewett, MD  rifampin (RIFADIN) 300 MG capsule Take 300 mg by mouth 2 (two) times daily.    Yes Historical Provider, MD  rOPINIRole (REQUIP) 2 MG tablet Take 2 mg by mouth 3 (three) times daily.   Yes Historical Provider, MD  venlafaxine XR (EFFEXOR-XR) 150 MG 24 hr capsule Take 150 mg by mouth 2 (two) times daily.    Yes Historical Provider, MD  vitamin B-12 (CYANOCOBALAMIN) 1000 MCG tablet Take 1,000 mcg by mouth daily.    Yes Historical Provider, MD  acetaminophen (TYLENOL) 325 MG tablet Take 2 tablets (650 mg total) by mouth every 6 (six) hours as needed for mild pain (or Fever >/= 101). 05/14/15   Aldean Jewett, MD  collagenase (SANTYL) ointment Apply topically daily. Patient not  taking: Reported on 06/27/2015 06/24/15   Aldean Jewett, MD  ferrous fumarate (HEMOCYTE - 106 MG FE) 325 (106 FE) MG TABS tablet Take 1 tablet (106 mg of iron total) by mouth 2 (two) times daily. Patient not taking: Reported on 06/13/2015 05/28/15   Hillary Bow, MD  senna (SENOKOT) 8.6 MG TABS tablet Take 1 tablet (8.6 mg total) by mouth daily as needed for mild constipation. Patient not taking: Reported on 06/27/2015 05/14/15   Aldean Jewett, MD  traMADol (ULTRAM) 50 MG tablet Take 1 tablet (50 mg total) by mouth every 6 (six) hours as needed for moderate pain. Patient taking differently: Take 50 mg by mouth 3 (three) times daily as needed for moderate pain.  05/14/15   Duanne Guess, PA-C    REVIEW OF SYSTEMS:  Review of Systems  Constitutional: Negative for fever, chills, weight loss and malaise/fatigue.  HENT: Negative for ear pain, hearing loss and tinnitus.   Eyes: Negative for blurred vision, double vision, pain and redness.  Respiratory: Negative for cough, hemoptysis and shortness of breath.   Cardiovascular: Negative for chest pain, palpitations, orthopnea and leg swelling.  Gastrointestinal: Negative for nausea, vomiting, abdominal pain, diarrhea and constipation.  Genitourinary: Negative for dysuria, frequency and hematuria.  Musculoskeletal: Positive for joint pain (right hip).  Negative for back pain and neck pain.  Skin:       Bleeding and drainage from surgical incision right lateral thigh. No acne, rash, or lesions  Neurological: Negative for dizziness, tremors, focal weakness and weakness.  Endo/Heme/Allergies: Negative for polydipsia. Does not bruise/bleed easily.  Psychiatric/Behavioral: Negative for depression. The patient is not nervous/anxious and does not have insomnia.      VITAL SIGNS:   Filed Vitals:   06/28/15 1730 06/28/15 1800 06/28/15 1830 06/28/15 2000  BP: 101/60 106/57 116/69 126/76  Pulse: 102 107 115 111  Temp:      TempSrc:      Resp: 21 23 23 24   Height:      Weight:      SpO2: 99% 100% 99% 97%   Wt Readings from Last 3 Encounters:  06/28/15 55.339 kg (122 lb)  06/27/15 51.6 kg (113 lb 12.1 oz)  06/27/15 51.256 kg (113 lb)    PHYSICAL EXAMINATION:  Physical Exam  Constitutional: She is oriented to person, place, and time. She appears well-developed. No distress.  HENT:  Head: Normocephalic and atraumatic.  Mouth/Throat: Oropharynx is clear and moist.  Eyes: Conjunctivae and EOM are normal. Pupils are equal, round, and reactive to light. No scleral icterus.  Neck: Normal range of motion. Neck supple. No JVD present. No thyromegaly present.  Cardiovascular: Normal rate, regular rhythm and intact distal pulses.  Exam reveals no gallop and no friction rub.   No murmur heard. Respiratory: Effort normal and breath sounds normal. No respiratory distress. She has no wheezes. She has no rales.  GI: Soft. Bowel sounds are normal. She exhibits no distension. There is no tenderness.  Musculoskeletal: Normal range of motion. She exhibits no edema.  Right thigh/hip wound VAC in place. No arthritis, no gout  Lymphadenopathy:    She has no cervical adenopathy.  Neurological: She is alert and oriented to person, place, and time. No cranial nerve deficit.  No dysarthria, no aphasia  Skin: Skin is warm and dry. No rash noted. No erythema.   Psychiatric: She has a normal mood and affect. Her behavior is normal. Judgment and thought content normal.    LABORATORY PANEL:  CBC  Recent Labs Lab 06/28/15 1735  WBC 8.6  HGB 7.3*  HCT 23.4*  PLT 436   ------------------------------------------------------------------------------------------------------------------  Chemistries   Recent Labs Lab 06/27/15 1330  NA 137  K 4.0  CL 103  CO2 28  GLUCOSE 87  BUN 16  CREATININE 0.50  CALCIUM 7.6*   ------------------------------------------------------------------------------------------------------------------  Cardiac Enzymes No results for input(s): TROPONINI in the last 168 hours. ------------------------------------------------------------------------------------------------------------------  RADIOLOGY:  Mr Hip Right Wo Contrast  06/27/2015   CLINICAL DATA:  Total right hip replacement with persistent bleeding from the wound, anemia, and hypotension with transfusions.  EXAM: MR OF THE RIGHT HIP WITHOUT CONTRAST  TECHNIQUE: Multiplanar, multisequence MR imaging was performed. No intravenous contrast was administered. Metal artifact reduction protocol sequencing was performed.  COMPARISON:  Multiple exams, including 05/09/2015  FINDINGS: Greater than normal metal artifact along the right hip due to the lateral cage, cerclage wires, and hip prosthesis. This results in more artifact than is often encountered.  We do observe a a 13.1 by approximately 4.5 by 7.6 cm fluid collection along the posterolateral trochanteric margin of the right hip prosthesis appearing to extend towards the skin along the wound. This has high inversion recovery and low T1 signal characteristics and may reflect blood products. Infection/abscess not readily excluded.  Proximal hamstring tendons intact. The degree of artifact precludes assessment of the bone of the right hip. There is deformity of the right inferior pubic ramus from an old fracture as  well as indistinctness of the quadrilateral plate from known prior acetabular injury.  The uterus, urinary bladder, and visualized bowel appear unremarkable.  Prominent atrophy of the right gluteal musculature.  IMPRESSION: 1. Persistent fluid collection lateral to the projected trochanteric region of the right hip. This probably drains to the wound based on continuity with the scan. The lateral cage, cerclage, and total hip prosthesis generate more than the normal amount of susceptibility artifact resulting in a considerable degree of metal artifact obscuration of surrounding structures despite the use of metal artifact reduction sequences. 2. Prominent atrophy of the right gluteal musculature. 3. Deformity of the right inferior pubic ramus with known deformity of the right acetabulum. Bony structures could be better assessed by CT, if clinically warranted.   Electronically Signed   By: Van Clines M.D.   On: 06/27/2015 17:38   Dg Hip Unilat With Pelvis 2-3 Views Right  06/27/2015   CLINICAL DATA:  RIGHT hip surgery in April and June, drainage from RIGHT hip wound  EXAM: RIGHT HIP (WITH PELVIS) 2-3 VIEWS  COMPARISON:  06/19/2015  FINDINGS: Diffuse osseous demineralization.  BILATERAL hip prostheses.  Deformity of proximal LEFT femur and greater trochanter.  Multiple cerclage wires and lateral plate at proximal RIGHT femur.  Several displaced bone fragments are noted cranial to the RIGHT greater trochanter.  Questionable osseous lucency involving the greater trochanter and at the proximal margin of the medial femur at the intertrochanteric region.  Unable to exclude osteomyelitis with this appearance.  Old fracture deformities of the pubic rami noted.  No acute pelvic abnormalities seen.  IMPRESSION: BILATERAL hip prostheses.  Questionable bone destruction/lucency involving the proximal RIGHT femur versus resorption secondary to recent surgery; unable to exclude osteomyelitis with this appearance.   Significant osseous demineralization with old healed pubic rami fractures.   Electronically Signed   By: Lavonia Dana M.D.   On: 06/27/2015 13:01    EKG:   Orders placed or performed during the hospital encounter of 06/17/15  . ED EKG  .  ED EKG  . EKG 12-Lead  . EKG 12-Lead  . EKG    IMPRESSION AND PLAN:  Principal Problem:   Delayed surgical wound healing - this is largely due to the development initially of a hematoma. Patient initially had a hip replacement surgery which had recalled heartburn and have revision surgery for removal of the same. Afterwards she began developing complications. She had a hematoma that Recurring and needed drainage, and this led to the development of a need for more surgeries in the delayed wound healing. He also has significant malnutrition which also complicates her wound healing. Orthopedic surgery is following her actively for this. Active Problems:   Anemia - she is artery requiring several transfusions, she has continued drainage and bleeding from this wound site her hemoglobin tends to drop. It is down to 7.3 this time, which is borderline on the threshold for needing transfusion. We'll monitor this again in the morning, and transfuse her as needed.   Coronary atherosclerosis - continue home medications   Essential hypertension - controlled, continue home meds   HLD (hyperlipidemia) - stable, continue home meds for this   Parkinson's disease - stable, continue home medications for this   Generalized anxiety disorder - stable, continue home medications  All the records are reviewed and case discussed with ED provider. Management plans discussed with the patient and/or family.  DVT PROPHYLAXIS: mechanical to left leg only as right leg has wound VAC in place, and bleeding prohibits pharmacologic prophylaxis.  ADMISSION STATUS: Observation  CODE STATUS: full   TOTAL TIME TAKING CARE OF THIS PATIENT:  40 minutes.    Rhen Kawecki Roc[place]le 06/28/2015,  8:39 PM  [facility]  Office  [phone]  CC: Primary care physician; Idelle Crouch, MD

## 2015-06-28 NOTE — ED Notes (Signed)
Pt was seen here yesterday for same problem- had rt hip replacement in April since then has been having drainage. Pt from [facility].

## 2015-06-28 NOTE — Care Management (Signed)
Order form for wound vac sent to [facility].  Provided patient with choices of of [facility] agencies.  Services set up with [facility].

## 2015-06-28 NOTE — ED Provider Notes (Signed)
-----------------------------------------   5:56 PM on 06/28/2015 -----------------------------------------  Claudette Head, social work, is still awaiting response is on numerous referrals for skilled nursing facility placement. At this time of day she states we are unlikely to receive any acceptances and will have to await responses in the morning. Patient and her daughter-in-law, Narda Rutherford, have been updated by Thomas Hoff, case management.    HgB on 6/29-9.1, 7/3-8.3, 7/6-7.7; Pt states she's had a total of 8units of blood & thinks the last transfusions were in June.    ----------------------------------------- 7:40 PM on 06/28/2015 -----------------------------------------  HgB today 7.3.  As it appears pt's blood count is continuing to drop, it is reasonable to admit her for observation.  She remains weak and unable to care for herself. I have discussed her case with Dr. Curt Bears, [facility] who will admit. I updated patient and her caregiver at the bedside who will update the family.  Ponciano Ort, MD 06/28/15 [phone]

## 2015-06-28 NOTE — Care Management (Signed)
Writer was contacted by [facility] to notify that patient has established home health services wit[facility].  Writer spoke with Corliss Blacker from [facility], and set up home health services for wound vac through them

## 2015-06-29 DIAGNOSIS — Z7982 Long term (current) use of aspirin: Secondary | ICD-10-CM | POA: Diagnosis not present

## 2015-06-29 DIAGNOSIS — F333 Major depressive disorder, recurrent, severe with psychotic symptoms: Secondary | ICD-10-CM | POA: Diagnosis not present

## 2015-06-29 DIAGNOSIS — A419 Sepsis, unspecified organism: Secondary | ICD-10-CM | POA: Diagnosis not present

## 2015-06-29 DIAGNOSIS — E46 Unspecified protein-calorie malnutrition: Secondary | ICD-10-CM | POA: Diagnosis present

## 2015-06-29 DIAGNOSIS — M353 Polymyalgia rheumatica: Secondary | ICD-10-CM | POA: Diagnosis present

## 2015-06-29 DIAGNOSIS — D62 Acute posthemorrhagic anemia: Secondary | ICD-10-CM | POA: Diagnosis present

## 2015-06-29 DIAGNOSIS — Z888 Allergy status to other drugs, medicaments and biological substances status: Secondary | ICD-10-CM | POA: Diagnosis not present

## 2015-06-29 DIAGNOSIS — T8189XA Other complications of procedures, not elsewhere classified, initial encounter: Secondary | ICD-10-CM | POA: Diagnosis not present

## 2015-06-29 DIAGNOSIS — Z792 Long term (current) use of antibiotics: Secondary | ICD-10-CM | POA: Diagnosis not present

## 2015-06-29 DIAGNOSIS — F411 Generalized anxiety disorder: Secondary | ICD-10-CM | POA: Diagnosis present

## 2015-06-29 DIAGNOSIS — N390 Urinary tract infection, site not specified: Secondary | ICD-10-CM | POA: Diagnosis not present

## 2015-06-29 DIAGNOSIS — X58XXXS Exposure to other specified factors, sequela: Secondary | ICD-10-CM | POA: Diagnosis not present

## 2015-06-29 DIAGNOSIS — E785 Hyperlipidemia, unspecified: Secondary | ICD-10-CM | POA: Diagnosis present

## 2015-06-29 DIAGNOSIS — Z885 Allergy status to narcotic agent status: Secondary | ICD-10-CM | POA: Diagnosis not present

## 2015-06-29 DIAGNOSIS — I959 Hypotension, unspecified: Secondary | ICD-10-CM | POA: Diagnosis present

## 2015-06-29 DIAGNOSIS — L89150 Pressure ulcer of sacral region, unstageable: Secondary | ICD-10-CM | POA: Diagnosis present

## 2015-06-29 DIAGNOSIS — R569 Unspecified convulsions: Secondary | ICD-10-CM | POA: Diagnosis present

## 2015-06-29 DIAGNOSIS — M96830 Postprocedural hemorrhage and hematoma of a musculoskeletal structure following a musculoskeletal system procedure: Secondary | ICD-10-CM | POA: Diagnosis present

## 2015-06-29 DIAGNOSIS — Z882 Allergy status to sulfonamides status: Secondary | ICD-10-CM | POA: Diagnosis not present

## 2015-06-29 DIAGNOSIS — M1990 Unspecified osteoarthritis, unspecified site: Secondary | ICD-10-CM | POA: Diagnosis present

## 2015-06-29 DIAGNOSIS — Z79899 Other long term (current) drug therapy: Secondary | ICD-10-CM | POA: Diagnosis not present

## 2015-06-29 DIAGNOSIS — I2510 Atherosclerotic heart disease of native coronary artery without angina pectoris: Secondary | ICD-10-CM | POA: Diagnosis present

## 2015-06-29 DIAGNOSIS — I10 Essential (primary) hypertension: Secondary | ICD-10-CM | POA: Diagnosis present

## 2015-06-29 DIAGNOSIS — S7001XS Contusion of right hip, sequela: Secondary | ICD-10-CM | POA: Diagnosis not present

## 2015-06-29 DIAGNOSIS — Z79891 Long term (current) use of opiate analgesic: Secondary | ICD-10-CM | POA: Diagnosis not present

## 2015-06-29 DIAGNOSIS — R339 Retention of urine, unspecified: Secondary | ICD-10-CM | POA: Diagnosis present

## 2015-06-29 DIAGNOSIS — Z87891 Personal history of nicotine dependence: Secondary | ICD-10-CM | POA: Diagnosis not present

## 2015-06-29 DIAGNOSIS — G20 Parkinson's disease: Secondary | ICD-10-CM | POA: Diagnosis not present

## 2015-06-29 DIAGNOSIS — R64 Cachexia: Secondary | ICD-10-CM | POA: Diagnosis present

## 2015-06-29 DIAGNOSIS — N828 Other female genital tract fistulae: Secondary | ICD-10-CM | POA: Diagnosis present

## 2015-06-29 LAB — BASIC METABOLIC PANEL
ANION GAP: 7 (ref 5–15)
BUN: 14 mg/dL (ref 6–20)
CHLORIDE: 104 mmol/L (ref 101–111)
CO2: 27 mmol/L (ref 22–32)
Calcium: 7.7 mg/dL — ABNORMAL LOW (ref 8.9–10.3)
Creatinine, Ser: 0.4 mg/dL — ABNORMAL LOW (ref 0.44–1.00)
GFR calc non Af Amer: >60 mL/min (ref >60)
GLUCOSE: 84 mg/dL (ref 65–99)
Potassium: 4 mmol/L (ref 3.5–5.1)
Sodium: 138 mmol/L (ref 135–145)

## 2015-06-29 LAB — CBC
HCT: 22.4 % — ABNORMAL LOW (ref 35.0–47.0)
HEMOGLOBIN: 7.2 g/dL — AB (ref 12.0–16.0)
MCH: 28.5 pg (ref 26.0–34.0)
MCHC: 32 g/dL (ref 32.0–36.0)
MCV: 89.2 fL (ref 80.0–100.0)
Platelets: 420 10*3/uL (ref 150–440)
RBC: 2.51 MIL/uL — AB (ref 3.80–5.20)
RDW: 20.7 % — AB (ref 11.5–14.5)
WBC: 5.4 10*3/uL (ref 3.6–11.0)

## 2015-06-29 LAB — PREPARE RBC (CROSSMATCH)

## 2015-06-29 MED ORDER — ENSURE ENLIVE PO LIQD
237.0000 mL | Freq: Three times a day (TID) | ORAL | Status: DC
  Administered 2015-06-29 – 2015-07-04 (×12): 237 mL via ORAL

## 2015-06-29 MED ORDER — SODIUM CHLORIDE 0.9 % IV SOLN: Freq: Once | INTRAVENOUS | Status: DC

## 2015-06-29 MED ORDER — SODIUM CHLORIDE 0.9 % IV SOLN
Freq: Once | INTRAVENOUS | Status: AC
  Administered 2015-06-29: 17:00:00 via INTRAVENOUS

## 2015-06-29 MED ORDER — FERROUS FUMARATE 325 (106 FE) MG PO TABS
1.0000 | ORAL_TABLET | Freq: Two times a day (BID) | ORAL | Status: DC
  Administered 2015-06-29 – 2015-06-30 (×3): 106 mg via ORAL
  Administered 2015-07-01: 1 via ORAL
  Administered 2015-07-01 – 2015-07-04 (×5): 106 mg via ORAL
  Filled 2015-06-29 (×10): qty 1

## 2015-06-29 NOTE — Progress Notes (Signed)
Pt. With decreased urine output. Bladder scan showing greater than 400. Spoke with Dr. Reece Levy. In and out cath x1.

## 2015-06-29 NOTE — Progress Notes (Signed)
Have left message with patient's POA, son Legrand Como. Need informed consent signed for blood transfusion.

## 2015-06-29 NOTE — Progress Notes (Signed)
Marisa Severin, son & poa, returned call & gave verbal telephone consent for blood transfusion. Dava Isley second nurse verification.

## 2015-06-29 NOTE — Progress Notes (Signed)
Pt will be going to [facility] upon discharge. VSS this shift, no complaints. Continue to assess.

## 2015-06-29 NOTE — Care Management (Signed)
This patient is from [facility] and open to [facility] which has been confirmed by Corliss Blacker with [facility]. Medicare Observation.

## 2015-06-29 NOTE — Progress Notes (Signed)
[facility] NAME: Sabrina Duncan    MR#:  [id number]  DATE OF BIRTH:  Nov 10, 1941  SUBJECTIVE:  CHIEF COMPLAINT:  Patient is admitted with the delayed surgical wound healing, and  Hematoma Resting comfortably with no complaints  REVIEW OF SYSTEMS:  CONSTITUTIONAL: No fever, fatigue, reporting weakness.  EYES: No blurred or double vision.  EARS, NOSE, AND THROAT: No tinnitus or ear pain.  RESPIRATORY: No cough, shortness of breath, wheezing or hemoptysis.  CARDIOVASCULAR: No chest pain, orthopnea, edema.  GASTROINTESTINAL: No nausea, vomiting, diarrhea or abdominal pain.  GENITOURINARY: No dysuria, hematuria.  ENDOCRINE: No polyuria, nocturia,  HEMATOLOGY: No anemia, easy bruising or bleeding SKIN: No rash or lesion. MUSCULOSKELETAL: No joint pain or arthritis. She has chronic nonhealing wound NEUROLOGIC: No tingling, numbness, weakness.  PSYCHIATRY: No anxiety or depression.   DRUG ALLERGIES:   Allergies  Allergen Reactions  . Morphine And Related Other (See Comments)    Reaction:  Hallucinations   . Oxycodone Other (See Comments)    Reaction:  Unknown   . Sulfa Antibiotics Itching  . Tazobactam Other (See Comments)    Reaction:  Unknown   . Adhesive [Tape] Rash  . Isosorbide Rash    VITALS:  Blood pressure 116/62, pulse 105, temperature 98.4 F (36.9 C), temperature source Oral, resp. rate 14, height 5\' 6"  (1.676 m), weight 55.339 kg (122 lb), SpO2 97 %.  PHYSICAL EXAMINATION:  GENERAL:  74 y.o.-year-old patient lying in the bed with no acute distress.  EYES: Pupils equal, round, reactive to light and accommodation. No scleral icterus. Extraocular muscles intact.  HEENT: Head atraumatic, normocephalic. Oropharynx and nasopharynx clear.  NECK:  Supple, no jugular venous distention. No thyroid enlargement, no tenderness.  LUNGS: Normal breath sounds bilaterally, no wheezing, rales,rhonchi or crepitation. No  use of accessory muscles of respiration.  CARDIOVASCULAR: S1, S2 normal. No murmurs, rubs, or gallops.  ABDOMEN: Soft, nontender, nondistended. Bowel sounds present. No organomegaly or mass.  EXTREMITIES: No pedal edema, cyanosis, or clubbing.  Right lower extremity with wound VAC on the nonhealing wound NEUROLOGIC: Cranial nerves II through XII are intact. Muscle strength 5/5 in all extremities. Sensation intact. Gait not checked.  PSYCHIATRIC: The patient is alert and oriented x 3.  SKIN: No obvious rash, lesion, or ulcer. Positive pressure ulcer   LABORATORY PANEL:   CBC  Recent Labs Lab 06/29/15 0436  WBC 5.4  HGB 7.2*  HCT 22.4*  PLT 420   ------------------------------------------------------------------------------------------------------------------  Chemistries   Recent Labs Lab 06/29/15 0436  NA 138  K 4.0  CL 104  CO2 27  GLUCOSE 84  BUN 14  CREATININE 0.40*  CALCIUM 7.7*   ------------------------------------------------------------------------------------------------------------------  Cardiac Enzymes No results for input(s): TROPONINI in the last 168 hours. ------------------------------------------------------------------------------------------------------------------  RADIOLOGY:  Mr Hip Right Wo Contrast  06/27/2015   CLINICAL DATA:  Total right hip replacement with persistent bleeding from the wound, anemia, and hypotension with transfusions.  EXAM: MR OF THE RIGHT HIP WITHOUT CONTRAST  TECHNIQUE: Multiplanar, multisequence MR imaging was performed. No intravenous contrast was administered. Metal artifact reduction protocol sequencing was performed.  COMPARISON:  Multiple exams, including 05/09/2015  FINDINGS: Greater than normal metal artifact along the right hip due to the lateral cage, cerclage wires, and hip prosthesis. This results in more artifact than is often encountered.  We do observe a a 13.1 by approximately 4.5 by 7.6 cm fluid collection  along the posterolateral trochanteric margin of the  right hip prosthesis appearing to extend towards the skin along the wound. This has high inversion recovery and low T1 signal characteristics and may reflect blood products. Infection/abscess not readily excluded.  Proximal hamstring tendons intact. The degree of artifact precludes assessment of the bone of the right hip. There is deformity of the right inferior pubic ramus from an old fracture as well as indistinctness of the quadrilateral plate from known prior acetabular injury.  The uterus, urinary bladder, and visualized bowel appear unremarkable.  Prominent atrophy of the right gluteal musculature.  IMPRESSION: 1. Persistent fluid collection lateral to the projected trochanteric region of the right hip. This probably drains to the wound based on continuity with the scan. The lateral cage, cerclage, and total hip prosthesis generate more than the normal amount of susceptibility artifact resulting in a considerable degree of metal artifact obscuration of surrounding structures despite the use of metal artifact reduction sequences. 2. Prominent atrophy of the right gluteal musculature. 3. Deformity of the right inferior pubic ramus with known deformity of the right acetabulum. Bony structures could be better assessed by CT, if clinically warranted.   Electronically Signed   By: Van Clines M.D.   On: 06/27/2015 17:38    EKG:   Orders placed or performed during the hospital encounter of 06/17/15  . ED EKG  . ED EKG  . EKG 12-Lead  . EKG 12-Lead  . EKG    ASSESSMENT AND PLAN:    # Delayed surgical wound healing - this is largely due to the development of a hematoma. Patient initially had a hip replacement surgery  and have revision surgery for removal of the same. Afterwards she began developing complications. She had a hematoma that Recurring and needed drainage, and this led to the development of a need for more surgeries in the delayed  wound healing. He also has significant malnutrition which also complicates her wound healing. Consult Orthopedic surgery is pending , continue wound VAC  #  Anemia - she is artery requiring several transfusions, she has continued drainage and bleeding from this wound site her hemoglobin tends to drop. It is down to 7.3 this time, which is borderline on the threshold for needing transfusion. We'll monitor this again in the morning, and transfuse her as needed.  # Coronary atherosclerosis - continue home medications  # Essential hypertension - controlled, continue home meds   #HLD (hyperlipidemia) - stable, continue home meds for this   #Parkinson's disease - stable, continue home medications for this  # Pressure ulcer-reposition patient every 2-3 hours, wound care   #Generalized anxiety disorder - stable, continue home medications      All the records are reviewed and case discussed with Care Management/Social Workerr. Management plans discussed with the patient, family and they are in agreement.  CODE STATUS: FULL  TOTAL TIME TAKING CARE OF THIS PATIENT: 35 minutes.   POSSIBLE D/C IN 2-3 DAYS, DEPENDING ON CLINICAL CONDITION.   Nicholes Mango M.D on 06/29/2015 at 2:33 PM  Between 7am to 6pm - Pager - [phone] After 6pm go to [email or web address] - password [email or web address] [facility]  Office  [phone]  CC: Primary care physician; Idelle Crouch, MD

## 2015-06-29 NOTE — Progress Notes (Signed)
PT WITH HEMOGLOBIN 7.2. .. PT NOT ON HOME IRON. MD REPORTS SHE WILL ORDER BLOOD FOR POSTOP ANEMIA  -  1 UNIT AND REORDER HOME DOSE OF IRON. SPOKE WITH DR Margaretmary Eddy

## 2015-06-29 NOTE — Progress Notes (Signed)
Problem with system has delayed blood admin. Wait to hear from John in blood lab.

## 2015-06-29 NOTE — Clinical Social Work Note (Signed)
Patient's daughter, Deborra Medina, requested CSW to call her. Jocelyn Lamer stated that she was the "daughter and sister in law on duty" this weekend and that she has been in touch with Gwenith Spitz and will be communicating to the family any updates. CSW extended the bed offers to [place] and after Jocelyn Lamer spoke with Gordy Clement called me back to accept the [place] [facility] bed offer. CSW has informed Marden Noble at [facility] and he is aware and is aware of the need for a wound vac. CSW contacted the physician to inquire if patient would be discharged over the weekend and the physician stated she was waiting on the surgical consult to see the plan of care. CSW informed her that if patient discharged over the weekend, that [facility] would need the orders today. The physician informed CSW that due to not knowing the plan of care, she could not provide the discharge summary today. Thus, patient is expected to remain over weekend with possible discharge on Monday, depending on interventions. CSW has updated Surveyor, minerals at [facility]. Shela Leff MSW,LCSWA [phone]

## 2015-06-29 NOTE — Progress Notes (Signed)
Initial Nutrition Assessment  DOCUMENTATION CODES:  Severe malnutrition in context of acute illness/injury  INTERVENTION:  Meals and Snacks: Cater to patient preferences Medical Food Supplement Therapy: will recommend Ensure Enlive TID for added nutrition; each Ensure Enlive (each supplement provides 350kcal and 20 grams of protein)  Coordination of Care: will request an accurate weight   NUTRITION DIAGNOSIS:  Inadequate oral intake related to acute illness as evidenced by per patient/family report.  GOAL:  Patient will meet greater than or equal to 90% of their needs  MONITOR:   (Energy Intake, Electrolyte and Renal Profile, Digestive system, Skin)  REASON FOR ASSESSMENT:  Malnutrition Screening Tool    ASSESSMENT:  Pt admitted with acute blood loss s/p recent right hip hematoma with multiple wound vac placements. PMHx:  Past Medical History  Diagnosis Date  . Depressive disorder, not elsewhere classified   . Unspecified osteomyelitis, site unspecified   . Polymyalgia rheumatica   . Paralysis agitans   . Osteoarthrosis, unspecified whether generalized or localized, unspecified site   . Muscle weakness (generalized)   . Difficulty in walking(719.7)   . Parkinson disease   . PMR (polymyalgia rheumatica)   . Coronary atherosclerosis of unspecified type of vessel, native or graft     2007: LAD PCI with a BMS, 80% distal LCX stenosis treated medically. Most recent stress test in 04/2012 showed distal anterio/apical scar? with no ischemia, EF 53%.   [name] Unspecified essential hypertension   . Other and unspecified hyperlipidemia   . Thoracic ascending aortic aneurysm     4.0 cm  . Complication of anesthesia     2013- hallucinations- [facility] (-spinal anesth. 2013- no problems   redo- hip- L)  . Esophageal reflux     pt. reports that its resolved   . Neuromuscular disorder     parkinson's - [facility] - neurologist   . Cancer     left breast  cancer- lumpectomy, chemo, radiation, tamoxifen , SLNBx  . Anemia, unspecified     bld. transfusion- 02/2013    Diet Order:  Diet Heart Room service appropriate?: Yes; Fluid consistency:: Thin  Current Nutrition: Pt reports eating very well today, much better than usual. Pt reports eating eggs this am at breakfast and all of her tuna at lunch. RD notes per I/O chart pt ate only 50% of breakfast tray.   Food/Nutrition-Related History: Pt reports poor appetite for the past couple of weeks PTA. Pt reports having 3 meals per day at facility but does not eat a lot from meals. Pt reports the past week eating <50%. RD also notes on last admission intake 36% of meals on average which was last week, and another encounter 3 weeks ago when pt reported poor po intake.   Medications: flagyl  Electrolyte/Renal Profile and Glucose Profile:   Recent Labs Lab 06/27/15 1330 06/29/15 0436  NA 137 138  K 4.0 4.0  CL 103 104  CO2 28 27  BUN 16 14  CREATININE 0.50 0.40*  CALCIUM 7.6* 7.7*  GLUCOSE 87 84   Protein Profile: No results for input(s): ALBUMIN in the last 168 hours.  Nutritional Anemia Profile:  CBC Latest Ref Rng 06/29/2015 06/28/2015 06/27/2015  WBC 3.6 - 11.0 K/uL 5.4 8.6 5.4  Hemoglobin 12.0 - 16.0 g/dL 7.2(L) 7.3(L) 7.7(L)  Hematocrit 35.0 - 47.0 % 22.4(L) 23.4(L) 24.2(L)  Platelets 150 - 440 K/uL 420 436 383    Gastrointestinal Profile: Last BM: 7/7   Nutrition-Focused Physical Exam Findings:  Nutrition-Focused physical exam completed. Findings are mild/moderate fat depletion, severe muscle depletion of hand, clavicle and acromion process with mild/moderate depletion of temple muscle, and no edema.    Weight Change: Pt with weight loss PTA. 12% weight loss from May-June 2016. Pt with stated current weight of 122lbs, but RD notes weight of 113lbs in the ED on 7/6, which would be additional 7% weight loss from last month. Anthropometrics:   Height:  Ht Readings from Last 1  Encounters:  06/28/15 5\' 6"  (1.676 m)    Weight:  Wt Readings from Last 1 Encounters:  06/28/15 122 lb (55.339 kg)    Ideal Body Weight:   59kg  Wt Readings from Last 10 Encounters:  06/28/15 122 lb (55.339 kg)  06/27/15 113 lb 12.1 oz (51.6 kg)  06/27/15 113 lb (51.256 kg)  06/23/15 90 lb (40.824 kg)  06/13/15 122 lb (55.339 kg)  05/28/15 127 lb 3.2 oz (57.698 kg)  05/09/15 138 lb (62.596 kg)  10/02/14 143 lb 8 oz (65.091 kg)  02/13/14 149 lb 12 oz (67.926 kg)  09/26/13 154 lb (69.854 kg)    BMI:  Body mass index is 19.7 kg/(m^2).  Estimated Nutritional Needs:  Kcal:  1474-1742kcals, BEE: 1116kcals, TEE: (IF 1.1-1.3)(AF 1.2) using IBW of 59kg  Protein:  71-89g protein (1.2-1.5g/kg) using IBW of 59kg  Fluid:  1475-1746mL of fluid (25-44mL/kG) using IBW of 59kg  Skin:   Stage III sacral ulcer, stage II coccyx ulcer, stage I knee and heel ulcers, stage III elbow ulcer  Diet Order:  Diet Heart Room service appropriate?: Yes; Fluid consistency:: Thin  EDUCATION NEEDS:  No education needs identified at this time   [place], RD, LDN Pager [phone]

## 2015-06-29 NOTE — Clinical Social Work Note (Signed)
CSW has left a message for Sabrina Duncan to extend bed offers. The following have offered beds to patient: [facility], [facility], [facility], and [facility]. Sabrina Duncan is considering. Patient will require wound vac. Shela Leff MSW,LCSWA [phone]

## 2015-06-30 DIAGNOSIS — G20 Parkinson's disease: Secondary | ICD-10-CM

## 2015-06-30 DIAGNOSIS — T8189XA Other complications of procedures, not elsewhere classified, initial encounter: Secondary | ICD-10-CM

## 2015-06-30 LAB — CBC
HEMATOCRIT: 27.3 % — AB (ref 35.0–47.0)
HEMOGLOBIN: 9.3 g/dL — AB (ref 12.0–16.0)
MCH: 29.7 pg (ref 26.0–34.0)
MCHC: 34.2 g/dL (ref 32.0–36.0)
MCV: 86.9 fL (ref 80.0–100.0)
Platelets: 413 10*3/uL (ref 150–440)
RBC: 3.14 MIL/uL — ABNORMAL LOW (ref 3.80–5.20)
RDW: 19.3 % — ABNORMAL HIGH (ref 11.5–14.5)
WBC: 7.6 10*3/uL (ref 3.6–11.0)

## 2015-06-30 LAB — BASIC METABOLIC PANEL
ANION GAP: 6 (ref 5–15)
BUN: 11 mg/dL (ref 6–20)
CALCIUM: 7.9 mg/dL — AB (ref 8.9–10.3)
CHLORIDE: 105 mmol/L (ref 101–111)
CO2: 27 mmol/L (ref 22–32)
Creatinine, Ser: 0.37 mg/dL — ABNORMAL LOW (ref 0.44–1.00)
GFR calc Af Amer: >60 mL/min (ref >60)
GFR calc non Af Amer: >60 mL/min (ref >60)
GLUCOSE: 101 mg/dL — AB (ref 65–99)
POTASSIUM: 4.1 mmol/L (ref 3.5–5.1)
Sodium: 138 mmol/L (ref 135–145)

## 2015-06-30 LAB — OCCULT BLOOD X 1 CARD TO LAB, STOOL: Fecal Occult Bld: POSITIVE — AB

## 2015-06-30 MED ORDER — QUETIAPINE FUMARATE 25 MG PO TABS
75.0000 mg | ORAL_TABLET | Freq: Every day | ORAL | Status: DC
  Administered 2015-06-30 – 2015-07-01 (×2): 75 mg via ORAL
  Filled 2015-06-30 (×2): qty 3

## 2015-06-30 NOTE — Consult Note (Signed)
CC: hx of Parkinson's disease.   HPI: Sabrina Duncan is an 74 y.o. female presents with repeated admission for acute blood loss anemia from a right hip hematoma related to multiple right hip surgeries and delayed wound healing. Neurology asked to see pt due to hx of PD as well as worsening mobility in her upper extremities. Pt did have recent surgery with devices that produced fragments of cobalt.     Past Medical History  Diagnosis Date  . Depressive disorder, not elsewhere classified   . Unspecified osteomyelitis, site unspecified   . Polymyalgia rheumatica   . Paralysis agitans   . Osteoarthrosis, unspecified whether generalized or localized, unspecified site   . Muscle weakness (generalized)   . Difficulty in walking(719.7)   . Parkinson disease   . PMR (polymyalgia rheumatica)   . Coronary atherosclerosis of unspecified type of vessel, native or graft     2007: LAD PCI with a BMS, 80% distal LCX stenosis treated medically. Most recent stress test in 04/2012 showed distal anterio/apical scar? with no ischemia, EF 53%.   Sabrina Duncan Unspecified essential hypertension   . Other and unspecified hyperlipidemia   . Thoracic ascending aortic aneurysm     4.0 cm  . Complication of anesthesia     2013- hallucinations- [facility]. (-spinal anesth. 2013- no problems   redo- hip- L)  . Esophageal reflux     pt. reports that its resolved   . Neuromuscular disorder     parkinson's - [facility] - neurologist   . Cancer     left breast cancer- lumpectomy, chemo, radiation, tamoxifen , SLNBx  . Anemia, unspecified     bld. transfusion- 02/2013    Past Surgical History  Procedure Laterality Date  . Knee surgery Left   . Varicose vein surgery      both legs   . Melanoma excision Right     elbow  . Knee surgery Left   . Hip replacement Bilateral   . Foot surgery Right 2013  . Rotator cuff repair Right   . Cardiac catheterization  2007    [place], [place]; x1 stent   . Cardiac catheterization  9/14    [facility]  . Breast lumpectomy Left 1997  . Joint replacement      L-hipx5, R hipx2, Lkneex1,   . Tonsillectomy    . Cholecystectomy  2005    open repair - for gangernous   . Peripherally inserted central catheter insertion      for IV antibiotics related to repeated infection, post hip replacement  . Reverse shoulder arthroplasty Right 09/29/2013    Procedure: REVERSE SHOULDER ARTHROPLASTY;  Surgeon: Nita Sells, MD;  Location: [place];  Service: Orthopedics;  Laterality: Right;  Right reverse total shoulder  . Hematoma evacuation Right 05/11/2015    Procedure: EVACUATION HEMATOMA/RIGHT HIP HEMATOMA DRAINAGE WITH WOUND VAC;  Surgeon: Hessie Knows, MD;  Location: [facility];  Service: Orthopedics;  Laterality: Right;  . Incision and drainage hip Right 06/19/2015    Procedure: IRRIGATION AND DEBRIDEMENT HIP;  Surgeon: Hessie Knows, MD;  Location: [facility];  Service: Orthopedics;  Laterality: Right;  hemovac placement    Family History  Problem Relation Age of Onset  . Hypertension Mother     Social History:  reports that she quit smoking about 30 years ago. Her smoking use included Cigarettes. She has a 5 pack-year smoking history. She does not have any smokeless tobacco history on file. She reports that she drinks  alcohol. She reports that she does not use illicit drugs.  Allergies  Allergen Reactions  . Morphine And Related Other (See Comments)    Reaction:  Hallucinations   . Oxycodone Other (See Comments)    Reaction:  Unknown   . Sulfa Antibiotics Itching  . Tazobactam Other (See Comments)    Reaction:  Unknown   . Adhesive [Tape] Rash  . Isosorbide Rash    Medications: I have reviewed the patient's current medications.  ROS: History obtained from the patient  General ROS: negative for - chills, fatigue, fever, night sweats, weight gain or weight loss Psychological ROS: negative for - behavioral disorder, hallucinations, memory  difficulties, mood swings or suicidal ideation Ophthalmic ROS: negative for - blurry vision, double vision, eye pain or loss of vision ENT ROS: negative for - epistaxis, nasal discharge, oral lesions, sore throat, tinnitus or vertigo Allergy and Immunology ROS: negative for - hives or itchy/watery eyes Hematological and Lymphatic ROS: negative for - bleeding problems, bruising or swollen lymph nodes Endocrine ROS: negative for - galactorrhea, hair pattern changes, polydipsia/polyuria or temperature intolerance Respiratory ROS: negative for - cough, hemoptysis, shortness of breath or wheezing Cardiovascular ROS: negative for - chest pain, dyspnea on exertion, edema or irregular heartbeat Gastrointestinal ROS: negative for - abdominal pain, diarrhea, hematemesis, nausea/vomiting or stool incontinence Genito-Urinary ROS: negative for - dysuria, hematuria, incontinence or urinary frequency/urgency Musculoskeletal ROS: negative for - joint swelling or muscular weakness Neurological ROS: as noted in HPI Dermatological ROS: negative for rash and skin lesion changes  Physical Examination: Blood pressure 102/54, pulse 96, temperature 99.3 F (37.4 C), temperature source Oral, resp. rate 18, height 5\' 6"  (1.676 m), weight 53.524 kg (118 lb), SpO2 100 %.    Mental Status: Alert, oriented, thought content appropriate.  Speech fluent without evidence of aphasia.  Able to follow 3 step commands without difficulty. Cranial Nerves: II: Discs flat bilaterally; Visual fields grossly normal, pupils equal, round, reactive to light and accommodation III,IV, VI: ptosis not present, extra-ocular motions intact bilaterally V,VII: smile symmetric, facial light touch sensation normal bilaterally VIII: hearing normal bilaterally IX,X: gag reflex present XI: bilateral shoulder shrug XII: midline tongue extension Motor: Right : Upper extremity   4+/5    Left:     Upper extremity   4+/5  Lower extremity    4+/5     Lower extremity   4+/5 Tone and bulk:normal tone throughout; no atrophy noted Sensory: Pinprick and light touch intact throughout, bilaterally Deep Tendon Reflexes: 1+ and symmetric throughout Plantars: Right: downgoing   Left: downgoing Cerebellar: normal finger-to-nose, normal rapid alternating movements and normal heel-to-shin test Gait: not tested      Laboratory Studies:   Basic Metabolic Panel:  Recent Labs Lab 06/27/15 1330 06/29/15 0436 06/30/15 0355  NA 137 138 138  K 4.0 4.0 4.1  CL 103 104 105  CO2 28 27 27   GLUCOSE 87 84 101*  BUN 16 14 11   CREATININE 0.50 0.40* 0.37*  CALCIUM 7.6* 7.7* 7.9*    Liver Function Tests: No results for input(s): AST, ALT, ALKPHOS, BILITOT, PROT, ALBUMIN in the last 168 hours. No results for input(s): LIPASE, AMYLASE in the last 168 hours. No results for input(s): AMMONIA in the last 168 hours.  CBC:  Recent Labs Lab 06/24/15 0424 06/27/15 1330 06/28/15 1735 06/29/15 0436 06/30/15 0355  WBC 6.2 5.4 8.6 5.4 7.6  NEUTROABS  --  4.2 7.1*  --   --   HGB 8.3* 7.7* 7.3* 7.2*  9.3*  HCT 25.5* 24.2* 23.4* 22.4* 27.3*  MCV 87.6 88.3 88.9 89.2 86.9  PLT 361 383 436 420 413    Cardiac Enzymes: No results for input(s): CKTOTAL, CKMB, CKMBINDEX, TROPONINI in the last 168 hours.  BNP: Invalid input(s): POCBNP  CBG: No results for input(s): GLUCAP in the last 168 hours.  Microbiology: Results for orders placed or performed during the hospital encounter of 06/17/15  Urine culture     Status: None   Collection Time: 06/18/15  1:39 PM  Result Value Ref Range Status   Specimen Description URINE, RANDOM  Final   Special Requests NONE  Final   Culture   Final    MULTIPLE SPECIES PRESENT, SUGGEST RECOLLECTION IF CLINICALLY INDICATED   Report Status 06/21/2015 FINAL  Final  Urine culture     Status: None   Collection Time: 06/19/15  6:46 PM  Result Value Ref Range Status   Specimen Description URINE, CLEAN CATCH   Final   Special Requests NONE  Final   Culture NO GROWTH 1 DAY  Final   Report Status 06/21/2015 FINAL  Final  Wound culture     Status: None   Collection Time: 06/19/15  6:46 PM  Result Value Ref Range Status   Specimen Description HIP  Final   Special Requests Immunocompromised  Final   Gram Stain   Final    FEW WBC SEEN FEW GRAM NEGATIVE RODS RARE GRAM POSITIVE RODS RARE GRAM POSITIVE COCCI IN PAIRS IN CLUSTERS    Culture RARE GROWTH COAGULASE NEGATIVE STAPHYLOCOCCUS  Final   Report Status 06/24/2015 FINAL  Final  Anaerobic culture     Status: None   Collection Time: 06/20/15  1:06 AM  Result Value Ref Range Status   Specimen Description WOUND  Final   Special Requests NONE  Final   Culture   Final    MODERATE GROWTH PREVOTELLA BIVIA BETA LACTAMASE POSITIVE    Report Status 06/25/2015 FINAL  Final   Organism ID, Bacteria PREVOTELLA BIVIA  Final  Anaerobic culture     Status: None   Collection Time: 06/20/15  2:00 AM  Result Value Ref Range Status   Specimen Description WOUND  Final   Special Requests NONE  Final   Culture   Final    MODERATE GROWTH PREVOTELLA BIVIA BETA LACTAMASE POSITIVE    Report Status 06/25/2015 FINAL  Final   Organism ID, Bacteria PREVOTELLA BIVIA  Final  Wound culture     Status: None   Collection Time: 06/20/15  2:00 AM  Result Value Ref Range Status   Specimen Description HIP  Final   Special Requests NONE  Final   Gram Stain   Final    FEW WBC SEEN FEW GRAM NEGATIVE RODS RARE GRAM POSITIVE RODS    Culture NO GROWTH 3 DAYS  Final   Report Status 06/24/2015 FINAL  Final    Coagulation Studies: No results for input(s): LABPROT, INR in the last 72 hours.  Urinalysis: No results for input(s): COLORURINE, LABSPEC, PHURINE, GLUCOSEU, HGBUR, BILIRUBINUR, KETONESUR, PROTEINUR, UROBILINOGEN, NITRITE, LEUKOCYTESUR in the last 168 hours.  Invalid input(s): APPERANCEUR  Lipid Panel:  No results found for: CHOL, TRIG, HDL, CHOLHDL, VLDL,  LDLCALC  HgbA1C: No results found for: HGBA1C  Urine Drug Screen:  No results found for: LABOPIA, COCAINSCRNUR, LABBENZ, AMPHETMU, THCU, LABBARB  Alcohol Level: No results for input(s): ETH in the last 168 hours.  Other results: EKG: normal EKG, normal sinus rhythm, unchanged from previous tracings.  Imaging: No results found. Assessment/Plan:  74 y.o. female presents with repeated admission for acute blood loss anemia from a right hip hematoma related to multiple right hip surgeries and delayed wound healing. Neurology asked to see pt due to hx of PD as well as worsening mobility in her upper extremities. Pt did have recent surgery with devices that produced fragments of cobalt.   Pt does have slight increase in stiffness, minimal rigidity.  Common side effects of Cobalt include Fatigue,  Nausea, Vertigo,  Tinnitus, Visual impairment,  Headaches, Anxiety and irritability I would not change her current anti parkinsonian medications and would follow up with Dr. Manuella Ghazi as out pt If further questions arise, pt can be followed up as out pt in movement d/o clinic.  Leotis Pain   06/30/2015, 5:31 PM

## 2015-06-30 NOTE — Progress Notes (Signed)
[place] at [place] NAME: Sabrina Duncan    MR#:  [id number]  DATE OF BIRTH:  04-Jun-1941  SUBJECTIVE:  CHIEF COMPLAINT:  Patient is admitted with the delayed surgical wound healing, and  Hematoma Received PRBC,hb 9.3   REVIEW OF SYSTEMS:  CONSTITUTIONAL: No fever, fatigue, reporting weakness.  EYES: No blurred or double vision.  EARS, NOSE, AND THROAT: No tinnitus or ear pain.  RESPIRATORY: No cough, shortness of breath, wheezing or hemoptysis.  CARDIOVASCULAR: No chest pain, orthopnea, edema.  GASTROINTESTINAL: No nausea, vomiting, diarrhea or abdominal pain.  GENITOURINARY: No dysuria, hematuria.  ENDOCRINE: No polyuria, nocturia,  HEMATOLOGY: No anemia, easy bruising or bleeding SKIN: No rash or lesion. Multiple ulcers, sacral pressure ulcer MUSCULOSKELETAL: No joint pain or arthritis. She has chronic nonhealing wound NEUROLOGIC: No tingling, numbness, weakness.  PSYCHIATRY: No anxiety or depression.   DRUG ALLERGIES:   Allergies  Allergen Reactions  . Morphine And Related Other (See Comments)    Reaction:  Hallucinations   . Oxycodone Other (See Comments)    Reaction:  Unknown   . Sulfa Antibiotics Itching  . Tazobactam Other (See Comments)    Reaction:  Unknown   . Adhesive [Tape] Rash  . Isosorbide Rash    VITALS:  Blood pressure 115/64, pulse 89, temperature 98 F (36.7 C), temperature source Oral, resp. rate 18, height 5\' 6"  (1.676 m), weight 59.421 kg (131 lb), SpO2 96 %.  PHYSICAL EXAMINATION:  GENERAL:  74 y.o.-year-old patient lying in the bed with no acute distress.  EYES: Pupils equal, round, reactive to light and accommodation. No scleral icterus. Extraocular muscles intact.  HEENT: Head atraumatic, normocephalic. Oropharynx and nasopharynx clear.  NECK:  Supple, no jugular venous distention. No thyroid enlargement, no tenderness.  LUNGS: Normal breath sounds bilaterally, no wheezing, rales,rhonchi or  crepitation. No use of accessory muscles of respiration.  CARDIOVASCULAR: S1, S2 normal. No murmurs, rubs, or gallops.  ABDOMEN: Soft, nontender, nondistended. Bowel sounds present. No organomegaly or mass.  EXTREMITIES: No pedal edema, cyanosis, or clubbing.  Right lower extremity with wound VAC on the nonhealing wound NEUROLOGIC: Cranial nerves II through XII are intact. Muscle strength 5/5 in all extremities. Sensation intact. Gait not checked.  PSYCHIATRIC: The patient is alert and oriented x 3.  SKIN: No obvious rash, lesion. Positive pressure ulcer   LABORATORY PANEL:   CBC  Recent Labs Lab 06/30/15 0355  WBC 7.6  HGB 9.3*  HCT 27.3*  PLT 413   ------------------------------------------------------------------------------------------------------------------  Chemistries   Recent Labs Lab 06/30/15 0355  NA 138  K 4.1  CL 105  CO2 27  GLUCOSE 101*  BUN 11  CREATININE 0.37*  CALCIUM 7.9*   ------------------------------------------------------------------------------------------------------------------  Cardiac Enzymes No results for input(s): TROPONINI in the last 168 hours. ------------------------------------------------------------------------------------------------------------------  RADIOLOGY:  No results found.  EKG:   Orders placed or performed during the hospital encounter of 06/17/15  . ED EKG  . ED EKG  . EKG 12-Lead  . EKG 12-Lead  . EKG    ASSESSMENT AND PLAN:    # Delayed surgical wound healing - this is largely due to the development of a hematoma. Patient initially had a hip replacement surgery  and have revision surgery for removal of the same followed by complications with delayed wound healing. He also has significant malnutrition which also complicates her wound healing. Consult Orthopedic surgery is pending , continue wound VAC, abx  #  Anemia - she is artery  requiring several transfusions, she has continued drainage and bleeding  from this wound site her hemoglobin tends to drop. It is down to 7.3 --> 9.3, check FOBT. We'll monitor this again in the morning, and transfuse her as needed.  # Coronary atherosclerosis - continue home medications  # Essential hypertension - controlled, continue home meds   #HLD (hyperlipidemia) - stable, continue home meds for this   #Parkinson's disease/ Cobalt metallosis  - worsening, neuro consult ,continue home medications  # Pressure ulcer-reposition patient every 2-3 hours, wound care consult   #Generalized anxiety disorder - stable, continue home medications   # Hallucinations- seroquel resumed    All the records are reviewed and case discussed with Care Management/Social Workerr. Management plans discussed with the patient, family and they are in agreement.  CODE STATUS: FULL  TOTAL TIME TAKING CARE OF THIS PATIENT: 35 minutes.   POSSIBLE D/C IN 2-3 DAYS, DEPENDING ON CLINICAL CONDITION.   Nicholes Mango M.D on 06/30/2015 at 12:51 PM  Between 7am to 6pm - Pager - [phone] After 6pm go to [email or web address] - password [email or web address] [facility]  Office  [phone]  CC: Primary care physician; Idelle Crouch, MD

## 2015-06-30 NOTE — Progress Notes (Signed)
Patient's hgb is 9.3 today. Seroquel added to bedtime med regime. Patient has a specialty bed to help with the pain of her pressure ulcers.  VSS this shift. Patient's appetite seems to be improving; she is attempting to eat more of her meals.  Family has round the clock sitters for her. Plan so far is to go to [facility] on Monday.

## 2015-07-01 NOTE — Progress Notes (Signed)
[place] at [place] NAME: Sabrina Duncan    MR#:  [id number]  DATE OF BIRTH:  07-08-1941  SUBJECTIVE:  CHIEF COMPLAINT:  Patient is admitted with the delayed surgical wound healing, and  Hematoma Received PRBC,hb 9.3, no new complaints, pleasant confusion  REVIEW OF SYSTEMS:  CONSTITUTIONAL: No fever, fatigue, reporting weakness.  EYES: No blurred or double vision.  EARS, NOSE, AND THROAT: No tinnitus or ear pain.  RESPIRATORY: No cough, shortness of breath, wheezing or hemoptysis.  CARDIOVASCULAR: No chest pain, orthopnea, edema.  GASTROINTESTINAL: No nausea, vomiting, diarrhea or abdominal pain.  GENITOURINARY: No dysuria, hematuria.  ENDOCRINE: No polyuria, nocturia,  HEMATOLOGY: No anemia, easy bruising or bleeding SKIN: No rash or lesion. Multiple ulcers, sacral pressure ulcer MUSCULOSKELETAL: No joint pain or arthritis. She has chronic nonhealing wound NEUROLOGIC: No tingling, numbness, weakness.  PSYCHIATRY: No anxiety or depression.   DRUG ALLERGIES:   Allergies  Allergen Reactions  . Morphine And Related Other (See Comments)    Reaction:  Hallucinations   . Oxycodone Other (See Comments)    Reaction:  Unknown   . Sulfa Antibiotics Itching  . Tazobactam Other (See Comments)    Reaction:  Unknown   . Adhesive [Tape] Rash  . Isosorbide Rash    VITALS:  Blood pressure 103/54, pulse 92, temperature 98 F (36.7 C), temperature source Oral, resp. rate 18, height 5\' 6"  (1.676 m), weight 50.349 kg (111 lb), SpO2 97 %.  PHYSICAL EXAMINATION:  GENERAL:  74 y.o.-year-old patient lying in the bed with no acute distress.  EYES: Pupils equal, round, reactive to light and accommodation. No scleral icterus. Extraocular muscles intact.  HEENT: Head atraumatic, normocephalic. Oropharynx and nasopharynx clear.  NECK:  Supple, no jugular venous distention. No thyroid enlargement, no tenderness.  LUNGS: Normal breath sounds  bilaterally, no wheezing, rales,rhonchi or crepitation. No use of accessory muscles of respiration.  CARDIOVASCULAR: S1, S2 normal. No murmurs, rubs, or gallops.  ABDOMEN: Soft, nontender, nondistended. Bowel sounds present. No organomegaly or mass.  EXTREMITIES: No pedal edema, cyanosis, or clubbing.  Right lower extremity with wound VAC on the nonhealing wound NEUROLOGIC: Cranial nerves II through XII are intact. Muscle strength 5/5 in all extremities. Sensation intact. Gait not checked.  PSYCHIATRIC: The patient is alert and oriented x 3.  SKIN: No obvious rash, lesion. Positive pressure ulcer   LABORATORY PANEL:   CBC  Recent Labs Lab 06/30/15 0355  WBC 7.6  HGB 9.3*  HCT 27.3*  PLT 413   ------------------------------------------------------------------------------------------------------------------  Chemistries   Recent Labs Lab 06/30/15 0355  NA 138  K 4.1  CL 105  CO2 27  GLUCOSE 101*  BUN 11  CREATININE 0.37*  CALCIUM 7.9*   ------------------------------------------------------------------------------------------------------------------  Cardiac Enzymes No results for input(s): TROPONINI in the last 168 hours. ------------------------------------------------------------------------------------------------------------------  RADIOLOGY:  No results found.  EKG:   Orders placed or performed during the hospital encounter of 06/17/15  . ED EKG  . ED EKG  . EKG 12-Lead  . EKG 12-Lead  . EKG    ASSESSMENT AND PLAN:    # Delayed surgical wound healing - this is largely due to the development of a hematoma. Patient initially had a hip replacement surgery  and have revision surgery for removal of the same followed by complications with delayed wound healing. He also has significant malnutrition which also complicates her wound healing. Consult Orthopedic surgery is pending , continue wound VAC, abx  #  Anemia -  she is artery requiring several  transfusions, she has continued drainage and bleeding from this wound site her hemoglobin tends to drop. It is down to 7.3 --> 9.3, check FOBT. We'll monitor this again in the morning, and transfuse her as needed.  # Coronary atherosclerosis - continue home medications  # Essential hypertension - controlled, continue home meds   #HLD (hyperlipidemia) - stable, continue home meds for this   #Parkinson's disease/ Cobalt metallosis  - worsening, neuro consult ,continue home medications  # Pressure ulcer-reposition patient every 2-3 hours, wound care consult   #Generalized anxiety disorder - stable, continue home medications   # Hallucinations- seroquel resumed    All the records are reviewed and case discussed with Care Management/Social Workerr. Management plans discussed with the patient, family and they are in agreement.  CODE STATUS: FULL  TOTAL TIME TAKING CARE OF THIS PATIENT: 35 minutes.   POSSIBLE D/C IN 2-3 DAYS, DEPENDING ON CLINICAL CONDITION.   Nicholes Mango M.D on 07/01/2015 at 4:53 PM  Between 7am to 6pm - Pager - [phone] After 6pm go to [email or web address] - password [email or web address] [facility]  Office  [phone]  CC: Primary care physician; Idelle Crouch, MD

## 2015-07-01 NOTE — Plan of Care (Signed)
Problem: Discharge Progression Outcomes Goal: Other Discharge Outcomes/Goals Outcome: Not Progressing Pt very confused this shift. Difficult to redirect at times. Had to reinforce seal on wound vac dressing on right hip. Still draining copious amounts. Medicated for pain x 1 for restlessness. Restarted on home med Seroquel last pm.

## 2015-07-01 NOTE — Progress Notes (Signed)
Pt. Pleasantly confused. Difficult to redirect. Wound vac dressing reinforced. Draining small amount of drainage. Pills whole with applesauce. No c/o pain. Pt. Turned q2h. Assisted with meals. Sitter at the bedside. Resting comfortably.

## 2015-07-02 ENCOUNTER — Inpatient Hospital Stay: Payer: Medicare Other

## 2015-07-02 DIAGNOSIS — R410 Disorientation, unspecified: Secondary | ICD-10-CM

## 2015-07-02 DIAGNOSIS — F333 Major depressive disorder, recurrent, severe with psychotic symptoms: Secondary | ICD-10-CM

## 2015-07-02 DIAGNOSIS — F05 Delirium due to known physiological condition: Secondary | ICD-10-CM

## 2015-07-02 LAB — URINALYSIS COMPLETE WITH MICROSCOPIC (ARMC ONLY)
Bilirubin Urine: NEGATIVE
Glucose, UA: NEGATIVE mg/dL
NITRITE: NEGATIVE
PROTEIN: NEGATIVE mg/dL
Specific Gravity, Urine: 1.019 (ref 1.005–1.030)
Squamous Epithelial / LPF: NONE SEEN
pH: 7 (ref 5.0–8.0)

## 2015-07-02 MED ORDER — PANTOPRAZOLE SODIUM 40 MG PO TBEC
40.0000 mg | DELAYED_RELEASE_TABLET | Freq: Two times a day (BID) | ORAL | Status: DC
  Administered 2015-07-02 – 2015-07-04 (×3): 40 mg via ORAL
  Filled 2015-07-02 (×4): qty 1

## 2015-07-02 MED ORDER — MIRTAZAPINE 15 MG PO TBDP
7.5000 mg | ORAL_TABLET | Freq: Every day | ORAL | Status: DC
  Administered 2015-07-03: 7.5 mg via ORAL
  Filled 2015-07-02 (×3): qty 0.5

## 2015-07-02 MED ORDER — QUETIAPINE FUMARATE 100 MG PO TABS
100.0000 mg | ORAL_TABLET | Freq: Every day | ORAL | Status: DC
  Administered 2015-07-02: 100 mg via ORAL
  Filled 2015-07-02: qty 1

## 2015-07-02 MED ORDER — HALOPERIDOL LACTATE 5 MG/ML IJ SOLN
2.0000 mg | Freq: Four times a day (QID) | INTRAMUSCULAR | Status: DC | PRN
  Administered 2015-07-02 – 2015-07-03 (×2): 2 mg via INTRAVENOUS
  Filled 2015-07-02 (×2): qty 1

## 2015-07-02 MED ORDER — QUETIAPINE FUMARATE 25 MG PO TABS
25.0000 mg | ORAL_TABLET | Freq: Every day | ORAL | Status: DC
  Administered 2015-07-03: 25 mg via ORAL
  Filled 2015-07-02 (×2): qty 1

## 2015-07-02 MED ORDER — LEVOFLOXACIN IN D5W 250 MG/50ML IV SOLN
250.0000 mg | INTRAVENOUS | Status: DC
  Administered 2015-07-02 – 2015-07-03 (×2): 250 mg via INTRAVENOUS
  Filled 2015-07-02 (×3): qty 50

## 2015-07-02 MED ORDER — COLLAGENASE 250 UNIT/GM EX OINT
TOPICAL_OINTMENT | Freq: Every day | CUTANEOUS | Status: DC
  Administered 2015-07-02 – 2015-07-04 (×3): via TOPICAL
  Filled 2015-07-02: qty 30

## 2015-07-02 MED ORDER — VENLAFAXINE HCL ER 75 MG PO CP24
75.0000 mg | ORAL_CAPSULE | Freq: Two times a day (BID) | ORAL | Status: DC
  Administered 2015-07-02 – 2015-07-04 (×4): 75 mg via ORAL
  Filled 2015-07-02 (×4): qty 1

## 2015-07-02 NOTE — Progress Notes (Signed)
Pt did well without a Charity fundraiser at bedtime. Did have periods of confusion and went through stages of being angry and sad about her Parkinson's disease and was upset while talking to family and friends on the phone. Wound vac still draining, turned and positioned for comfort. Tolerating meds whole in applesauce. Needs max assist with positioning.

## 2015-07-02 NOTE — Progress Notes (Signed)
ANTIBIOTIC CONSULT NOTE - INITIAL  Pharmacy Consult for Levaquin Indication: UTI  Allergies  Allergen Reactions  . Morphine And Related Other (See Comments)    Reaction:  Hallucinations   . Oxycodone Other (See Comments)    Reaction:  Unknown   . Sulfa Antibiotics Itching  . Tazobactam Other (See Comments)    Reaction:  Unknown   . Adhesive [Tape] Rash  . Isosorbide Rash    Patient Measurements: Height: 5\' 6"  (167.6 cm) Weight: 123 lb (55.792 kg) IBW/kg (Calculated) : 59.3  Vital Signs: Temp: 98.1 F (36.7 C) (07/11 1949) Temp Source: Oral (07/11 1949) BP: 99/48 mmHg (07/11 1949) Pulse Rate: 39 (07/11 1952) Intake/Output from previous day: 07/10 0701 - 07/11 0700 In: 0  Out: 502 [Urine:202; Drains:300] Intake/Output from this shift:    Labs:  Recent Labs  06/30/15 0355  WBC 7.6  HGB 9.3*  PLT 413  CREATININE 0.37*   Estimated Creatinine Clearance: 56 mL/min (by C-G formula based on Cr of 0.37). No results for input(s): VANCOTROUGH, VANCOPEAK, VANCORANDOM, GENTTROUGH, GENTPEAK, GENTRANDOM, TOBRATROUGH, TOBRAPEAK, TOBRARND, AMIKACINPEAK, AMIKACINTROU, AMIKACIN in the last 72 hours.   Microbiology: Recent Results (from the past 720 hour(s))  Urine culture     Status: None   Collection Time: 06/18/15  1:39 PM  Result Value Ref Range Status   Specimen Description URINE, RANDOM  Final   Special Requests NONE  Final   Culture   Final    MULTIPLE SPECIES PRESENT, SUGGEST RECOLLECTION IF CLINICALLY INDICATED   Report Status 06/21/2015 FINAL  Final  Urine culture     Status: None   Collection Time: 06/19/15  6:46 PM  Result Value Ref Range Status   Specimen Description URINE, CLEAN CATCH  Final   Special Requests NONE  Final   Culture NO GROWTH 1 DAY  Final   Report Status 06/21/2015 FINAL  Final  Wound culture     Status: None   Collection Time: 06/19/15  6:46 PM  Result Value Ref Range Status   Specimen Description HIP  Final   Special Requests  Immunocompromised  Final   Gram Stain   Final    FEW WBC SEEN FEW GRAM NEGATIVE RODS RARE GRAM POSITIVE RODS RARE GRAM POSITIVE COCCI IN PAIRS IN CLUSTERS    Culture RARE GROWTH COAGULASE NEGATIVE STAPHYLOCOCCUS  Final   Report Status 06/24/2015 FINAL  Final  Anaerobic culture     Status: None   Collection Time: 06/20/15  1:06 AM  Result Value Ref Range Status   Specimen Description WOUND  Final   Special Requests NONE  Final   Culture   Final    MODERATE GROWTH PREVOTELLA BIVIA BETA LACTAMASE POSITIVE    Report Status 06/25/2015 FINAL  Final   Organism ID, Bacteria PREVOTELLA BIVIA  Final  Anaerobic culture     Status: None   Collection Time: 06/20/15  2:00 AM  Result Value Ref Range Status   Specimen Description WOUND  Final   Special Requests NONE  Final   Culture   Final    MODERATE GROWTH PREVOTELLA BIVIA BETA LACTAMASE POSITIVE    Report Status 06/25/2015 FINAL  Final   Organism ID, Bacteria PREVOTELLA BIVIA  Final  Wound culture     Status: None   Collection Time: 06/20/15  2:00 AM  Result Value Ref Range Status   Specimen Description HIP  Final   Special Requests NONE  Final   Gram Stain   Final    FEW WBC  SEEN FEW GRAM NEGATIVE RODS RARE GRAM POSITIVE RODS    Culture NO GROWTH 3 DAYS  Final   Report Status 06/24/2015 FINAL  Final    Medical History: Past Medical History  Diagnosis Date  . Depressive disorder, not elsewhere classified   . Unspecified osteomyelitis, site unspecified   . Polymyalgia rheumatica   . Paralysis agitans   . Osteoarthrosis, unspecified whether generalized or localized, unspecified site   . Muscle weakness (generalized)   . Difficulty in walking(719.7)   . Parkinson disease   . PMR (polymyalgia rheumatica)   . Coronary atherosclerosis of unspecified type of vessel, native or graft     2007: LAD PCI with a BMS, 80% distal LCX stenosis treated medically. Most recent stress test in 04/2012 showed distal anterio/apical scar? with  no ischemia, EF 53%.   [name] Unspecified essential hypertension   . Other and unspecified hyperlipidemia   . Thoracic ascending aortic aneurysm     4.0 cm  . Complication of anesthesia     2013- hallucinations- [facility]. (-spinal anesth. 2013- no problems   redo- hip- L)  . Esophageal reflux     pt. reports that its resolved   . Neuromuscular disorder     parkinson's - [facility] - neurologist   . Cancer     left breast cancer- lumpectomy, chemo, radiation, tamoxifen , SLNBx  . Anemia, unspecified     bld. transfusion- 02/2013    Medications:  Scheduled:  . aspirin  81 mg Oral Daily  . atorvastatin  20 mg Oral QHS  . carbidopa-levodopa  2 tablet Oral QID  . clindamycin  300 mg Oral BID  . collagenase   Topical Daily  . feeding supplement (ENSURE ENLIVE)  237 mL Oral TID WC  . ferrous fumarate  1 tablet Oral BID  . levofloxacin (LEVAQUIN) IV  250 mg Intravenous Q24H  . metoprolol  50 mg Oral BID  . metroNIDAZOLE  250 mg Oral 3 times per day  . mirtazapine  7.5 mg Oral QHS  . pantoprazole  40 mg Oral BID  . QUEtiapine  100 mg Oral QHS  . [START ON 07/03/2015] QUEtiapine  25 mg Oral Q breakfast  . rifampin  300 mg Oral BID  . rOPINIRole  2 mg Oral TID  . venlafaxine XR  75 mg Oral BID   Infusions:   Assessment: Pharmacy consulted to dose levofloxacin for treatment of UTI.  Est CrCl~56 mL/min  Goal of Therapy:  Resolution of infection  Plan:  Will order Levaquin 250 mg IV q24h based on treatment of UTI and renal function. Follow up culture results   Pharmacy will continue to follow.  Chief Walkup G 07/02/2015,9:33 PM

## 2015-07-02 NOTE — Progress Notes (Signed)
Called prime doc to hold BP meds tonight. Dr. Jannifer Franklin ok'd to hold. Will cont to monitor.

## 2015-07-02 NOTE — Clinical Documentation Improvement (Signed)
Please specify diagnosis related to below supporting documentation, if appropriate.   Possible Clinical Conditions? Severe Malnutrition   Severe Protein Calorie Malnutrition Other Condition Cannot clinically determine  Supporting Information:  Per 07/01/15 & 06/30/15  MD note = Patient initially had a hip replacement surgery and have revision surgery for removal of the same followed by complications with delayed wound healing. He also has significant malnutrition which also complicates her wound healing.   Per 06/29/15 RD Assessment note = Severe malnutrition in context of acute illness/injury.      Thank You, Serena Colonel ,RN Clinical Documentation Specialist:  [place] [place] [facility]

## 2015-07-02 NOTE — Consult Note (Signed)
WOC wound consult note Reason for Consult: Right hip nonhealing surgical wound, followed by surgery. VAC dressing changed today Sacral unstageable pressure ulcer Moisture Associated Skin Damage to bilateral gluteal folds.  Left elbow, chronic ulcer.  History surgery to this area.  Wound type: Pressure ulcers Surgical wound followed  Pressure Ulcer POA: Yes Measurement: Sacrum 6 cm x 4 cm x 1 cm 100% slough in wound bed.  Bilateral gluteal folds 1 cm x 1 cm x 0.1 cm moisture associated skin damage (MASD). No disposables to skin.  Left elbow 1 cm x 1 cm x 0.5 cm erythema Bilateral heels reddened, offloading boots in place.  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 Dr Rudene Christians changed today  Drainage (amount, consistency, odor) RIght hip, heavy, bloody drainage.  Sacrum, minimal serosanguinous drainage. No odor. Periwound: Intact  Spoke with daughter, Jocelyn Lamer over the phone.  She is concerned with nutritional and psychiatric state at this time. Both consults are pending.  Patient ate 25% lunch.  Caregiver is offering ongoing intake.  Dressing procedure/placement/frequency: Cleanse sacrum with NS and pat gently dry. Apply Santyl ointment to wound bed. Cover with NS moist dressing. Cover with ABD pad and secure with tape. Change daily.   Cleanse bilateral buttocks breakdown with soap and water. Apply barrier cream twice daily and PRN soilage. Offload heels with Prevalon boot LALM for pressure redistribution.   VAC dressing change Monday-Wednesday-Friday Will not follow at this time.  Please re-consult if needed.  Domenic Moras RN BSN [facility] Pager [phone]        Reason for Consult: Wound type: Pressure Ulcer POA: Yes/No Measurement: Wound bed: Drainage (amount, consistency, odor)  Periwound: Dressing procedure/placement/frequency: Conservative sharp wound debridement (CSWD performed at the bedside):

## 2015-07-02 NOTE — Progress Notes (Signed)
Sacral dressing change done. Cleansed sacrum with NS and applied Santyl ointment to wound.  Covered with NS  moist dressing. Covered with ABD pad and secured with tape. Will continue to monitor.

## 2015-07-02 NOTE — Progress Notes (Signed)
Wound vac changed, wound is slow to heal, minimal drainage noted. Recommend nutrition evaluation to aid wound healing. Will need to see in one week.  Discussed with daughter in law on phone, feels like stopping Seroquel has made patient worse and requests no one stop it.

## 2015-07-02 NOTE — Progress Notes (Signed)
[facility] NAME: Sabrina Duncan    MR#:  [id number]  DATE OF BIRTH:  11-23-1941  SUBJECTIVE:  CHIEF COMPLAINT:  Patient is hallucinating today. Black Stool noticed per vagina   REVIEW OF SYSTEMS:  Pt with ams today  DRUG ALLERGIES:   Allergies  Allergen Reactions  . Morphine And Related Other (See Comments)    Reaction:  Hallucinations   . Oxycodone Other (See Comments)    Reaction:  Unknown   . Sulfa Antibiotics Itching  . Tazobactam Other (See Comments)    Reaction:  Unknown   . Adhesive [Tape] Rash  . Isosorbide Rash    VITALS:  Blood pressure 97/59, pulse 100, temperature 99.4 F (37.4 C), temperature source Oral, resp. rate 16, height 5\' 6"  (1.676 m), weight 55.792 kg (123 lb), SpO2 97 %.  PHYSICAL EXAMINATION:  GENERAL:  74 y.o.-year-old patient lying in the bed with no acute distress.  EYES: Pupils equal, round, reactive to light and accommodation. No scleral icterus. Extraocular muscles intact.  HEENT: Head atraumatic, normocephalic. Oropharynx and nasopharynx clear.  NECK:  Supple, no jugular venous distention. No thyroid enlargement, no tenderness.  LUNGS: Normal breath sounds bilaterally, no wheezing, rales,rhonchi or crepitation. No use of accessory muscles of respiration.  CARDIOVASCULAR: S1, S2 normal. No murmurs, rubs, or gallops.  ABDOMEN: Soft, nontender, nondistended. Bowel sounds present. No organomegaly or mass.  EXTREMITIES: No pedal edema, cyanosis, or clubbing.  Right lower extremity with wound VAC on the nonhealing wound NEUROLOGIC: Cranial nerves II through XII are intact. Muscle strength 5/5 in all extremities. Sensation intact. Gait not checked.  PSYCHIATRIC: The patient is alert and oriented times 1  SKIN: No obvious rash, lesion. Positive pressure ulcer   LABORATORY PANEL:   CBC  Recent Labs Lab 06/30/15 0355  WBC 7.6  HGB 9.3*  HCT 27.3*  PLT 413    ------------------------------------------------------------------------------------------------------------------  Chemistries   Recent Labs Lab 06/30/15 0355  NA 138  K 4.1  CL 105  CO2 27  GLUCOSE 101*  BUN 11  CREATININE 0.37*  CALCIUM 7.9*   ------------------------------------------------------------------------------------------------------------------  Cardiac Enzymes No results for input(s): TROPONINI in the last 168 hours. ------------------------------------------------------------------------------------------------------------------  RADIOLOGY:  Dg Chest Port 1 View  07/02/2015   CLINICAL DATA:  Shortness of Breath  EXAM: PORTABLE CHEST - 1 VIEW  COMPARISON:  None.  FINDINGS: Patient is rotated. There is no edema or consolidation. Heart is mildly enlarged with pulmonary vascularity within normal limits. No adenopathy. There is atherosclerotic change in aorta. There is a total shoulder replacement on the right. There is evidence of an old healed fracture of the proximal left humeral metaphysis. There is also an old healed fracture of the lateral left clavicle. There are surgical clips in the left axillary region with evidence of prior left mastectomy.  IMPRESSION: No edema or consolidation.  Mild cardiac enlargement.   Electronically Signed   By: Lowella Grip III M.D.   On: 07/02/2015 13:33    EKG:   Orders placed or performed during the hospital encounter of 06/17/15  . ED EKG  . ED EKG  . EKG 12-Lead  . EKG 12-Lead  . EKG    ASSESSMENT AND PLAN:  # AMS - with abnml u/a prob A cystitis  Urine cx , abx CXR - nml  # Hematochezia  PPI, CBC am,GI consult  # ?Colovaginal fistula-  GI consult    # Delayed surgical wound healing - this  is largely due to the development of a hematoma. Patient initially had a hip replacement surgery  and have revision surgery for removal of the same followed by complications with delayed wound healing. He also has  significant malnutrition which also complicates her wound healing. Discussed with dr.Menz Orthopedic surgery , plan is to change wound VAC, abx  #  Anemia - she is artery requiring several transfusions, she has continued drainage and bleeding from this wound site her hemoglobin tends to drop. It is down to 7.3 --> 9.3, check FOBT. We'll monitor this again in the morning, and transfuse her as needed.  # Coronary atherosclerosis - continue home medications  # Essential hypertension - controlled, continue home meds   #HLD (hyperlipidemia) - stable, continue home meds for this   #Parkinson's disease/ Cobalt metallosis  - worsening, neuro consult ,continue home medications  # Pressure ulcer-reposition patient every 2-3 hours, wound care consult   #Generalized anxiety disorder - stable, continue home medications   # Hallucinations- seroquel resumed, psych consult    All the records are reviewed and case discussed with Care Management/Social Workerr. Management plans discussed with the patient, family and they are in agreement.  CODE STATUS: FULL  TOTAL TIME TAKING CARE OF THIS PATIENT: 35 minutes.   POSSIBLE D/C IN 2-3 DAYS, DEPENDING ON CLINICAL CONDITION.   Nicholes Mango M.D on 07/02/2015 at 6:42 PM  Between 7am to 6pm - Pager - [phone] After 6pm go to [email or web address] - password [email or web address] [facility]  Office  [phone]  CC: Primary care physician; Idelle Crouch, MD

## 2015-07-02 NOTE — Consult Note (Signed)
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 Delayed surgical wound healing [T81.89XA] 06/28/2015  . Pressure ulcer [L89.90] 06/18/2015  . Hyponatremia [E87.1] 06/17/2015  . Arthritis [M19.90] 06/13/2015  . Benign breast lumps [N60.29] 06/13/2015  . Clinical depression [F32.9] 06/13/2015  . Hemorrhoid [K64.9] 06/13/2015  . Protein-calorie malnutrition, severe [E43] 05/28/2015  . Anemia [D64.9] 05/27/2015  . Acute blood loss anemia [D62] 05/14/2015  . Hematoma [T14.8] 05/14/2015  . Parkinson's disease [G20] 05/14/2015  . Generalized anxiety disorder [F41.1] 05/14/2015  . Coronary atherosclerosis [I25.10]   . Essential hypertension [I10]   . HLD (hyperlipidemia) [E78.5]   . Thoracic ascending aortic aneurysm [I71.2]     Total Time spent with patient: 1 hour  Subjective:   Sabrina Duncan is a 74 y.o. female patient admitted with "it's a long story". Patient gives a fairly good history despite her symptoms. See history of present illness below. Information also obtained from the patient's daughter-in-law.[name]  HPI:  This is a 37 year old woman who is in the hospital with another round of complications related to her hip surgeries. This has been a recurrent problem over the last few years and she first had hip replacement. She also suffers from Parkinson's disease which has been present for the last few years. The consult is because of her hallucinations and depression. Patient states that her mood feels down and depressed and panicky all the time. She is crying frequently. Feels  hopeless. She believes that people are trying to poison her. Part of her is able to recognize that this is not true but she continues to be troubled by the delusion. She elaborated episodes which clearly did not happen. When I inquire of her whether these are dreams she says that it is possible but they feel more real than dreams. She is not sleeping well and she is not eating well in part because of not being hungry in part because of her paranoia. She is currently being treated with 75 mg of Seroquel a day and 5 mg of Abilify twice a day plus Effexor XR 150 mg twice a day. Daughter reports that medicines have been added decreased and changed frequently over the last several months as the patient has been in and out of rehabs or the hospital.  Past psychiatric history: Patient reports that decades ago when she was in her 54s she had an episode of depression but denies any history of suicidal behavior or hospitalization. The current symptoms have been present for about 3 years. It's not entirely clear whether they tracked more with the hip surgery or the Parkinson's disease since both of those things started around the same time. No psychiatric hospitalizations no suicide attempts. Current Effexor has been used for at least a few months from what the daughter can remember. Daughter feels that Seroquel has been uniquely effective for her agitation and psychosis.  Medical history: Patient has had multiple complications from hip surgery. She has trouble with wound healing and has had infections. She is back in the hospital being treated for difficulty with wound healing. When she gets sick like that she is more prone to getting delirious and confused. Patient has Parkinson's  disease that was diagnosed about 3 years ago. She sees Dr. shop. Coronary artery disease and high blood pressure history of anemia.  Social history: Patient has 2 sons and daughters-in-law who live here in the [place] area. The  daughters-in-law appear to be the most involved in her care. Seems to have a very good and supportive relationship with her family. She has been living at [facility] when she is not in the hospital.  Family history: The patient tells me that her sister had mental health problems as a young woman.  Substance abuse history: No history of alcohol or drug abuse.  Current medications: Psychiatric medicines currently are Seroquel 75 mg at night, Abilify 5 mg twice a day and Effexor XR 150 mg twice a day HPI Elements:   Quality:  Tearfulness depression confusion psychosis. Severity:  Severe affecting her life quality and ability to take care of herself. Timing:  Waxing and waning present for several years but getting worse the last few days. Duration:  Wax and wane.. Context:  Acute illness as well as medication changes.  Past Medical History:  Past Medical History  Diagnosis Date  . Depressive disorder, not elsewhere classified   . Unspecified osteomyelitis, site unspecified   . Polymyalgia rheumatica   . Paralysis agitans   . Osteoarthrosis, unspecified whether generalized or localized, unspecified site   . Muscle weakness (generalized)   . Difficulty in walking(719.7)   . Parkinson disease   . PMR (polymyalgia rheumatica)   . Coronary atherosclerosis of unspecified type of vessel, native or graft     2007: LAD PCI with a BMS, 80% distal LCX stenosis treated medically. Most recent stress test in 04/2012 showed distal anterio/apical scar? with no ischemia, EF 53%.   [name] Unspecified essential hypertension   . Other and unspecified hyperlipidemia   . Thoracic ascending aortic aneurysm     4.0 cm  . Complication of anesthesia     2013- hallucinations- [facility]. (-spinal anesth. 2013- no problems   redo- hip- L)  . Esophageal reflux     pt. reports that its resolved   . Neuromuscular disorder     parkinson's - [facility] - neurologist   . Cancer     left breast  cancer- lumpectomy, chemo, radiation, tamoxifen , SLNBx  . Anemia, unspecified     bld. transfusion- 02/2013    Past Surgical History  Procedure Laterality Date  . Knee surgery Left   . Varicose vein surgery      both legs   . Melanoma excision Right     elbow  . Knee surgery Left   . Hip replacement Bilateral   . Foot surgery Right 2013  . Rotator cuff repair Right   . Cardiac catheterization  2007    [place], [place]; x1 stent  . Cardiac catheterization  9/14    [facility]  . Breast lumpectomy Left 1997  . Joint replacement      L-hipx5, R hipx2, Lkneex1,   . Tonsillectomy    . Cholecystectomy  2005    open repair - for gangernous   . Peripherally inserted central catheter insertion      for IV antibiotics related to repeated infection, post hip replacement  . Reverse shoulder arthroplasty Right 09/29/2013    Procedure: REVERSE SHOULDER ARTHROPLASTY;  Surgeon: Nita Sells, MD;  Location: [place];  Service: Orthopedics;  Laterality: Right;  Right reverse total shoulder  . Hematoma evacuation Right 05/11/2015  Procedure: EVACUATION HEMATOMA/RIGHT HIP HEMATOMA DRAINAGE WITH WOUND VAC;  Surgeon: Hessie Knows, MD;  Location: [facility];  Service: Orthopedics;  Laterality: Right;  . Incision and drainage hip Right 06/19/2015    Procedure: IRRIGATION AND DEBRIDEMENT HIP;  Surgeon: Hessie Knows, MD;  Location: [facility];  Service: Orthopedics;  Laterality: Right;  hemovac placement   Family History:  Family History  Problem Relation Age of Onset  . Hypertension Mother    Social History:  History  Alcohol Use  . Yes    Comment: rarely     History  Drug Use No    History   Social History  . Marital Status: Widowed    Spouse Name: N/A  . Number of Children: N/A  . Years of Education: N/A   Social History Main Topics  . Smoking status: Former Smoker -- 0.50 packs/day for 10 years    Types: Cigarettes    Quit date: 09/26/1984  . Smokeless tobacco: Not on file  . Alcohol  Use: Yes     Comment: rarely  . Drug Use: No  . Sexual Activity: Not on file   Other Topics Concern  . None   Social History Narrative   Additional Social History:                          Allergies:   Allergies  Allergen Reactions  . Morphine And Related Other (See Comments)    Reaction:  Hallucinations   . Oxycodone Other (See Comments)    Reaction:  Unknown   . Sulfa Antibiotics Itching  . Tazobactam Other (See Comments)    Reaction:  Unknown   . Adhesive [Tape] Rash  . Isosorbide Rash    Labs:  Results for orders placed or performed during the hospital encounter of 06/28/15 (from the past 48 hour(s))  Urinalysis complete, with microscopic ([facility] only)     Status: Abnormal   Collection Time: 07/02/15 11:28 AM  Result Value Ref Range   Color, Urine AMBER (A) YELLOW   APPearance CLEAR (A) CLEAR   Glucose, UA NEGATIVE NEGATIVE mg/dL   Bilirubin Urine NEGATIVE NEGATIVE   Ketones, ur TRACE (A) NEGATIVE mg/dL   Specific Gravity, Urine 1.019 1.005 - 1.030   Hgb urine dipstick 1+ (A) NEGATIVE   pH 7.0 5.0 - 8.0   Protein, ur NEGATIVE NEGATIVE mg/dL   Nitrite NEGATIVE NEGATIVE   Leukocytes, UA 2+ (A) NEGATIVE   RBC / HPF 0-5 0 - 5 RBC/hpf   WBC, UA 6-30 0 - 5 WBC/hpf   Bacteria, UA RARE (A) NONE SEEN   Squamous Epithelial / LPF NONE SEEN NONE SEEN    Vitals: Blood pressure 97/59, pulse 100, temperature 99.4 F (37.4 C), temperature source Oral, resp. rate 16, height 5\' 6"  (1.676 m), weight 55.792 kg (123 lb), SpO2 97 %.  Risk to Self: Is patient at risk for suicide?: No Risk to Others:   Prior Inpatient Therapy:   Prior Outpatient Therapy:    Current Facility-Administered Medications  Medication Dose Route Frequency Provider Last Rate Last Dose  . acetaminophen (TYLENOL) tablet 650 mg  650 mg Oral Q6H PRN Lance Coon, MD      . aspirin chewable tablet 81 mg  81 mg Oral Daily Lance Coon, MD   81 mg at 07/02/15 0946  . atorvastatin (LIPITOR) tablet 20  mg  20 mg Oral QHS Lance Coon, MD   20 mg at 07/01/15 2203  . carbidopa-levodopa (  SINEMET CR) 50-200 MG per tablet controlled release 2 tablet  2 tablet Oral QID Lance Coon, MD   2 tablet at 07/02/15 1726  . clindamycin (CLEOCIN) capsule 300 mg  300 mg Oral BID Lance Coon, MD   300 mg at 07/02/15 0946  . collagenase (SANTYL) ointment   Topical Daily Aruna Gouru, MD      . feeding supplement (ENSURE ENLIVE) (ENSURE ENLIVE) liquid 237 mL  237 mL Oral TID WC Nicholes Mango, MD   237 mL at 07/02/15 1727  . ferrous fumarate (HEMOCYTE - 106 mg FE) tablet 106 mg of iron  1 tablet Oral BID Nicholes Mango, MD   106 mg of iron at 07/02/15 0947  . metoprolol (LOPRESSOR) tablet 50 mg  50 mg Oral BID Lance Coon, MD   50 mg at 07/01/15 2202  . metroNIDAZOLE (FLAGYL) tablet 250 mg  250 mg Oral 3 times per day Lance Coon, MD   250 mg at 07/02/15 1401  . mirtazapine (REMERON SOL-TAB) disintegrating tablet 7.5 mg  7.5 mg Oral QHS Gonzella Lex, MD      . QUEtiapine (SEROQUEL) tablet 100 mg  100 mg Oral QHS Gonzella Lex, MD      . Derrill Memo ON 07/03/2015] QUEtiapine (SEROQUEL) tablet 25 mg  25 mg Oral Q breakfast Gonzella Lex, MD      . rifampin (RIFADIN) capsule 300 mg  300 mg Oral BID Lance Coon, MD   300 mg at 07/02/15 0946  . rOPINIRole (REQUIP) tablet 2 mg  2 mg Oral TID Lance Coon, MD   2 mg at 07/02/15 1726  . senna-docusate (Senokot-S) tablet 1 tablet  1 tablet Oral QHS PRN Lance Coon, MD      . traMADol Veatrice Bourbon) tablet 50 mg  50 mg Oral Q6H PRN Lance Coon, MD   50 mg at 07/01/15 2203  . venlafaxine XR (EFFEXOR-XR) 24 hr capsule 75 mg  75 mg Oral BID Gonzella Lex, MD        Musculoskeletal: Strength & Muscle Tone: decreased Gait & Station: unsteady Patient leans: N/A  Psychiatric Specialty Exam: Physical Exam  Constitutional: She appears well-developed. She appears cachectic. She appears distressed.  HENT:  Head: Normocephalic and atraumatic.  Eyes: Conjunctivae are normal. Pupils are  equal, round, and reactive to light.  Neck: Normal range of motion.  Cardiovascular: Normal heart sounds.   Respiratory: Effort normal.  GI: Soft.  Musculoskeletal: Normal range of motion.  Neurological: She is alert. She displays tremor.  Skin: Skin is warm and dry. Ecchymosis and petechiae noted.  Psychiatric: Her mood appears anxious. Her speech is tangential. She is agitated and withdrawn. Thought content is paranoid and delusional. Cognition and memory are impaired. She expresses inappropriate judgment. She exhibits a depressed mood. She expresses no suicidal ideation. She exhibits abnormal remote memory.    Review of Systems  Constitutional: Positive for weight loss and malaise/fatigue.  HENT: Negative.   Eyes: Negative.   Respiratory: Negative.   Cardiovascular: Negative.   Gastrointestinal: Negative.   Musculoskeletal: Negative.   Skin: Negative.   Neurological: Positive for tremors, focal weakness and weakness.  Psychiatric/Behavioral: Positive for depression, hallucinations and memory loss. Negative for suicidal ideas and substance abuse. The patient is nervous/anxious.     Blood pressure 97/59, pulse 100, temperature 99.4 F (37.4 C), temperature source Oral, resp. rate 16, height 5\' 6"  (1.676 m), weight 55.792 kg (123 lb), SpO2 97 %.Body mass index is 19.86 kg/(m^2).  General Appearance: Neat  Eye Contact::  Good  Speech:  Slow  Volume:  Decreased  Mood:  Depressed  Affect:  Depressed  Thought Process:  Disorganized and Loose  Orientation:  Other:  Patient initially told me she believes she was in the police station. Once I pointed out some of the things in the room she corrected herself and told me she knew she was in the hospital but still at the same time had a belief she was in a police station. She is oriented to the month and year.  Thought Content:  Delusions, Hallucinations: Visual and Paranoid Ideation  Suicidal Thoughts:  No  Homicidal Thoughts:  No  Memory:   Immediate;   Good Recent;   Fair Remote;   Fair  Judgement:  Impaired  Insight:  Shallow  Psychomotor Activity:  Decreased and Tremor  Concentration:  Fair  Recall:  [employer] of Knowledge:Fair  Language: Fair  Akathisia:  No  Handed:  Right  AIMS (if indicated):     Assets:  Communication Skills Desire for Improvement Financial Resources/Insurance Housing Intimacy Social Support  ADL's:  Impaired  Cognition: Impaired,  Mild  Sleep:      Medical Decision Making: Review of Psycho-Social Stressors (1), Review or order clinical lab tests (1), Review and summation of old records (2), Established Problem, Worsening (2), Review of Medication Regimen & Side Effects (2) and Review of New Medication or Change in Dosage (2)  Treatment Plan Summary: Medication management and Plan Spoke with the patient and spoke with her daughter-in-law. The patient has symptoms of depression with psychosis. Complicating the diagnosis is the presence of her Parkinson's disease which may be a cause of her psychotic symptoms as well as multiple medications including her Rife and 10 and Requip and carbidopa which could potentially add to psychotic or depressive symptoms. Patient is not acutely suicidal but is extremely impaired by her illness. Plan in terms of medication is to discontinue Abilify as being more prone to worsen her Parkinson's disease and unnecessary to duplicate Seroquel, increase the dose of Seroquel to 100 mg at night and 25 mg in the day, begin cross titration of antidepressives by decreasing Effexor to 75 mg twice a day and initiating mirtazapine 7.5 mg at night. Daughter is agreeable to all of this. Psychoeducation provided to the patient and the daughter. I will continue to follow while she is in the hospital.  Plan:  No evidence of imminent risk to self or others at present.   Supportive therapy provided about ongoing stressors. Disposition: At this point does not require psychiatric  hospitalization. I strongly agree with the daughter that with this patient could benefit from was consistent care with continuity 1 she is out of the hospital. We will see if there is a way we can refer her to an outpatient provider.  Sabrina Duncan 07/02/2015 5:46 PM

## 2015-07-02 NOTE — Care Management (Signed)
Important Message  Patient Details  Name: Sabrina Duncan MRN: [id number] Date of Birth: 23-Dec-1940   Medicare Important Message Given:  Yes-second notification given    Juliann Pulse A Allmond 07/02/2015, 11:47 AM

## 2015-07-03 LAB — TYPE AND SCREEN
ABO/RH(D): O POS
Antibody Screen: NEGATIVE
Unit division: 0
Unit division: 0

## 2015-07-03 LAB — BASIC METABOLIC PANEL
Anion gap: 7 (ref 5–15)
BUN: 18 mg/dL (ref 6–20)
CALCIUM: 8.1 mg/dL — AB (ref 8.9–10.3)
CO2: 26 mmol/L (ref 22–32)
Chloride: 107 mmol/L (ref 101–111)
Creatinine, Ser: 0.62 mg/dL (ref 0.44–1.00)
GFR calc Af Amer: >60 mL/min (ref >60)
Glucose, Bld: 109 mg/dL — ABNORMAL HIGH (ref 65–99)
POTASSIUM: 4.4 mmol/L (ref 3.5–5.1)
SODIUM: 140 mmol/L (ref 135–145)

## 2015-07-03 LAB — CBC
HCT: 28.4 % — ABNORMAL LOW (ref 35.0–47.0)
Hemoglobin: 9.2 g/dL — ABNORMAL LOW (ref 12.0–16.0)
MCH: 28.8 pg (ref 26.0–34.0)
MCHC: 32.6 g/dL (ref 32.0–36.0)
MCV: 88.3 fL (ref 80.0–100.0)
PLATELETS: 431 10*3/uL (ref 150–440)
RBC: 3.21 MIL/uL — AB (ref 3.80–5.20)
RDW: 20 % — ABNORMAL HIGH (ref 11.5–14.5)
WBC: 6 10*3/uL (ref 3.6–11.0)

## 2015-07-03 MED ORDER — PRO-STAT SUGAR FREE PO LIQD: 30.0000 mL | Freq: Every day | ORAL | Status: DC

## 2015-07-03 MED ORDER — MIRTAZAPINE 15 MG PO TBDP
15.0000 mg | ORAL_TABLET | Freq: Every day | ORAL | Status: DC
  Administered 2015-07-03: 15 mg via ORAL
  Filled 2015-07-03: qty 1

## 2015-07-03 MED ORDER — QUETIAPINE FUMARATE 25 MG PO TABS
150.0000 mg | ORAL_TABLET | Freq: Every day | ORAL | Status: DC
  Administered 2015-07-03: 150 mg via ORAL
  Filled 2015-07-03: qty 2

## 2015-07-03 NOTE — Progress Notes (Signed)
Pt very anxious w/ hallucinations last night. Refused meds and vitals. Had to call MD to get PRN haldol order. Haldol given twice w/ improvement of anxiety. Rested some between care. Wound vac in place to right hip. Will cont to monitor.

## 2015-07-03 NOTE — Care Management (Signed)
Poor PO intake, stage III wound management on coccyx, received Haldol during night per RN due to agitation where hospital security had to be called to assist with patient. No palliative available currently. Spoke with [facility] and she will check to see if there is palliative available from [facility] hospital. Is TPN and option? Discussed during progression rounds the possibility of LTAC. [facility] is reviewing chart. RNCM will continue to follow. High risk for readmission due to agitation and poor PO.

## 2015-07-03 NOTE — Progress Notes (Signed)
Nutrition Follow-up  DOCUMENTATION CODES:  Severe malnutrition in context of acute illness/injury  INTERVENTION:  Meals and Snacks: Cater to patient preferences; spoke with Nsg and caregivers agreeable to offering liquids/meals when pt alert enough to take po safely. Will recommend Dypshagia III/Mechanical Soft diet order for ease of chewing as daughter-in-law reports pt is without dentures. Medical Food Supplement Therapy: Recommend continuing Ensure Enlive TID as ordered for added nutrition. Will also send Magic cup QID for added nutrition. Will send Prostat as 3pm snack for added protein to promote would healing.  Coordination of Care: spoke with pt daughter-in-law Jocelyn Lamer who is very concerned of po intake; will post calorie count to better assess caloric and protein intake.   NUTRITION DIAGNOSIS:  Inadequate oral intake related to acute illness as evidenced by per patient/family report.   GOAL:  Patient will meet greater than or equal to 90% of their needs; ongoing  MONITOR:   (Energy Intake, Electrolyte and Renal Profile, Digestive system, Skin)  ASSESSMENT:     Pt with hallucinations and agitation last night; haldol given. Pt lethargic this am on visit. 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    Diet Order:  DIET DYS 3 Room service appropriate?: Yes; Fluid consistency:: Thin  Current Nutrition: Pt ate 0% this am as pt lethargic on visit. Pt took 2 sips of Ensure with caregiver. Per documentation pt po intake decreasing. Pt eating 50% of meals 4 days ago, 10% on Sunday. Per caregiver pt ate 50% of tuna salad and 100% of sherbet yesterday at lunch with her providing feeding assistance.    Gastrointestinal Profile: Last BM: 7/11   Medications: Remeron, Flaygyl, Protonix  Electrolyte/Renal Profile and Glucose Profile:   Recent Labs Lab 06/29/15 0436 06/30/15 0355 07/03/15 0537  NA 138 138 140  K 4.0 4.1 4.4  CL 104  105 107  CO2 27 27 26   BUN 14 11 18   CREATININE 0.40* 0.37* 0.62  CALCIUM 7.7* 7.9* 8.1*  GLUCOSE 84 101* 109*   Protein Profile: No results for input(s): ALBUMIN in the last 168 hours.   Weight Trend since Admission: No new weight documented. Filed Weights   07/01/15 0321 07/02/15 0354 07/03/15 0520  Weight: 111 lb (50.349 kg) 123 lb (55.792 kg) 122 lb (55.339 kg)     BMI:  Body mass index is 19.7 kg/(m^2).  Estimated Nutritional Needs:  Kcal:  1474-1742kcals, BEE: 1116kcals, TEE: (IF 1.1-1.3)(AF 1.2) using IBW of 59kg  Protein:  71-89g protein (1.2-1.5g/kg) using IBW of 59kg  Fluid:  1475-1724mL of fluid (25-16mL/kG) using IBW of 59kg  EDUCATION NEEDS:  No education needs identified at this time  HIGH Care Level  Dwyane Luo, RD, LDN Pager [phone]

## 2015-07-03 NOTE — Clinical Social Work Note (Signed)
RN CM has made referral to [facility] has stated that they can take patient. [facility] rep has spoken to patient's daughter, Jocelyn Lamer, this afternoon and they are going to discuss with the family and make a determination. Currently bed still on hold at [facility].  Shela Leff MSW,LCSWA [phone]

## 2015-07-03 NOTE — Clinical Social Work Note (Signed)
CSW did speak with Jocelyn Lamer to explain how the LTAC option arose. Shela Leff MSW,LCSWA [phone]

## 2015-07-03 NOTE — Care Management (Signed)
[facility] has accepted patient and Seth Bake has spoken to patients daughter Jocelyn Lamer. MD paged to update; family still deciding on LTAC. [facility] packet started.

## 2015-07-03 NOTE — Progress Notes (Signed)
[facility] [place] NAME: Sabrina Duncan    MR#:  [id number]  DATE OF BIRTH:  [birth year]  SUBJECTIVE:  CHIEF COMPLAINT:  Patient was agitated last night and received Haldol. Very lethargic during my examination, arousable but falling asleep. Black Stool noticed per vagina. Her caregiver at bedside   REVIEW OF SYSTEMS:  Pt with ams today  DRUG ALLERGIES:   Allergies  Allergen Reactions  . Morphine And Related Other (See Comments)    Reaction:  Hallucinations   . Oxycodone Other (See Comments)    Reaction:  Unknown   . Sulfa Antibiotics Itching  . Tazobactam Other (See Comments)    Reaction:  Unknown   . Adhesive [Tape] Rash  . Isosorbide Rash    VITALS:  Blood pressure 96/70, pulse 85, temperature 100.1 F (37.8 C), temperature source Axillary, resp. rate 16, height 5\' 6"  (1.676 m), weight 55.339 kg (122 lb), SpO2 100 %.  PHYSICAL EXAMINATION:  GENERAL:  74 y.o.-year-old patient lying in the bed with no acute distress.  EYES: Pupils equal, round, reactive to light and accommodation. No scleral icterus. Extraocular muscles intact.  HEENT: Head atraumatic, normocephalic. Oropharynx and nasopharynx clear.  NECK:  Supple, no jugular venous distention. No thyroid enlargement, no tenderness.  LUNGS: Normal breath sounds bilaterally, no wheezing, rales,rhonchi or crepitation. No use of accessory muscles of respiration.  CARDIOVASCULAR: S1, S2 normal. No murmurs, rubs, or gallops.  ABDOMEN: Soft, nontender, nondistended. Bowel sounds present. No organomegaly or mass.  EXTREMITIES: No pedal edema, cyanosis, or clubbing.  Right lower extremity with wound VAC on the nonhealing wound NEUROLOGIC: Lethargic  PSYCHIATRIC: Lethargic SKIN: No obvious rash, lesion. Positive pressure ulcer   LABORATORY PANEL:   CBC  Recent Labs Lab 07/03/15 0743  WBC 6.0  HGB 9.2*  HCT 28.4*  PLT 431    ------------------------------------------------------------------------------------------------------------------  Chemistries   Recent Labs Lab 07/03/15 0537  NA 140  K 4.4  CL 107  CO2 26  GLUCOSE 109*  BUN 18  CREATININE 0.62  CALCIUM 8.1*   ------------------------------------------------------------------------------------------------------------------  Cardiac Enzymes No results for input(s): TROPONINI in the last 168 hours. ------------------------------------------------------------------------------------------------------------------  RADIOLOGY:  Dg Chest Port 1 View  07/02/2015   CLINICAL DATA:  Shortness of Breath  EXAM: PORTABLE CHEST - 1 VIEW  COMPARISON:  None.  FINDINGS: Patient is rotated. There is no edema or consolidation. Heart is mildly enlarged with pulmonary vascularity within normal limits. No adenopathy. There is atherosclerotic change in aorta. There is a total shoulder replacement on the right. There is evidence of an old healed fracture of the proximal left humeral metaphysis. There is also an old healed fracture of the lateral left clavicle. There are surgical clips in the left axillary region with evidence of prior left mastectomy.  IMPRESSION: No edema or consolidation.  Mild cardiac enlargement.   Electronically Signed   By: Lowella Grip III M.D.   On: 07/02/2015 13:33    EKG:   Orders placed or performed during the hospital encounter of 06/17/15  . ED EKG  . ED EKG  . EKG 12-Lead  . EKG 12-Lead  . EKG    ASSESSMENT AND PLAN:  # AMS - with abnml u/a prob A cystitis  Urine cx , abx CXR - nml  # Hematochezia  PPI, CBC am,GI consult pending - notified by RN   # ?Colovaginal fistula-  GI consult PENDING   # Delayed surgical wound healing - this is largely  due to the development of a hematoma. Patient initially had a hip replacement surgery  and have revision surgery for removal of the same followed by complications with delayed  wound healing. He also has significant malnutrition which also complicates her wound healing. Discussed with dr.Menz Orthopedic surgery ,  changed wound VAC yesterday, continue current anti-biotics clindamycin, Flagyl and rifampin   #  Anemia - she is artery requiring several transfusions, she has continued drainage and bleeding from this wound site her hemoglobin tends to drop. It is down to 7.3 --> 9.3--> 9.2, positive FOBT. We'll monitor this again in the morning, and transfuse her as needed. GI consulted  # Coronary atherosclerosis - continue home medications  # Essential hypertension - controlled, continue home meds   #HLD (hyperlipidemia) - stable, continue home meds for this   #Parkinson's disease/ Cobalt metallosis  - worsening, neuro consult ,continue home medications  # Pressure ulcer-reposition patient every 2-3 hours, wound care consult   #Generalized anxiety disorder - stable, continue home medications   # Hallucinations- seroquel resumed, psych consult  #Acute urinary retention- we will do bladder scan, will consider Foley catheter if urine is greater than 4 50 cc    All the records are reviewed and case discussed with Care Management/Social Workerr. Management plans discussed with the patient, family and they are in agreement.  CODE STATUS: FULL  TOTAL TIME TAKING CARE OF THIS PATIENT: 35 minutes.   POSSIBLE D/C IN 2-3 DAYS, DEPENDING ON CLINICAL CONDITION.   Nicholes Mango M.D on 07/03/2015 at 1:01 PM  Between 7am to 6pm - Pager - [phone] After 6pm go to [email or web address] - password [email or web address] [facility]  Office  [phone]  CC: Primary care physician; Idelle Crouch, MD

## 2015-07-03 NOTE — Consult Note (Signed)
GI Inpatient Consult Note  Reason for Consult: vaginal fistula, heme positive stool    Attending Requesting Consult:Dr. Berline Lopes  History of Present Illness: Sabrina Duncan is a 74 y.o. female.  She has had repeated admissions for acute blood loss anemia due to delayed wound healing and issues with multiple right hip surgeries.  She reports that she had some black stools recently, attributes it to taking iron supplements.  She denies abdominal pain.  She does require care for stooling and cleaning.  I also spoke with her daughter-in-law  Jocelyn Lamer by phone.  I was unable to find any previous medical history that is speaks to a vaginal fistula.  Her daughter-in-law  was unaware of any diagnosis for this in the past.  She did report that her mother-in-law  does tend to sit in her stool and  Is not always able to clean herself properly.  She reports the patient had a colonoscopy in [place] approximately 3-5 years ago, but is unsure of the results.   She also reports the patient has a history of hemorrhoids, but has not had any flare ups for the past couple years.   Past Medical History:  Past Medical History  Diagnosis Date  . Depressive disorder, not elsewhere classified   . Unspecified osteomyelitis, site unspecified   . Polymyalgia rheumatica   . Paralysis agitans   . Osteoarthrosis, unspecified whether generalized or localized, unspecified site   . Muscle weakness (generalized)   . Difficulty in walking(719.7)   . Parkinson disease   . PMR (polymyalgia rheumatica)   . Coronary atherosclerosis of unspecified type of vessel, native or graft     2007: LAD PCI with a BMS, 80% distal LCX stenosis treated medically. Most recent stress test in 04/2012 showed distal anterio/apical scar? with no ischemia, EF 53%.   [name] Unspecified essential hypertension   . Other and unspecified hyperlipidemia   . Thoracic ascending aortic aneurysm     4.0 cm  . Complication of anesthesia     2013-  hallucinations- [facility]. (-spinal anesth. 2013- no problems   redo- hip- L)  . Esophageal reflux     pt. reports that its resolved   . Neuromuscular disorder     parkinson's - [facility] - neurologist   . Cancer     left breast cancer- lumpectomy, chemo, radiation, tamoxifen , SLNBx  . Anemia, unspecified     bld. transfusion- 02/2013    Problem List: Patient Active Problem List   Diagnosis Date Noted  . Subacute delirium 07/02/2015  . Severe recurrent major depression with psychotic features 07/02/2015  . Delayed surgical wound healing 06/28/2015  . Pressure ulcer 06/18/2015  . Hyponatremia 06/17/2015  . Arthritis 06/13/2015  . Benign breast lumps 06/13/2015  . Clinical depression 06/13/2015  . Hemorrhoid 06/13/2015  . Protein-calorie malnutrition, severe 05/28/2015  . Anemia 05/27/2015  . Acute blood loss anemia 05/14/2015  . Hematoma 05/14/2015  . Parkinson's disease 05/14/2015  . Generalized anxiety disorder 05/14/2015  . Coronary atherosclerosis   . Essential hypertension   . HLD (hyperlipidemia)   . Thoracic ascending aortic aneurysm     Past Surgical History: Past Surgical History  Procedure Laterality Date  . Knee surgery Left   . Varicose vein surgery      both legs   . Melanoma excision Right     elbow  . Knee surgery Left   . Hip replacement Bilateral   . Foot surgery Right 2013  .  Rotator cuff repair Right   . Cardiac catheterization  2007    [place], [place]; x1 stent  . Cardiac catheterization  9/14    [facility]  . Breast lumpectomy Left 1997  . Joint replacement      L-hipx5, R hipx2, Lkneex1,   . Tonsillectomy    . Cholecystectomy  2005    open repair - for gangernous   . Peripherally inserted central catheter insertion      for IV antibiotics related to repeated infection, post hip replacement  . Reverse shoulder arthroplasty Right 09/29/2013    Procedure: REVERSE SHOULDER ARTHROPLASTY;  Surgeon: Nita Sells, MD;   Location: [facility];  Service: Orthopedics;  Laterality: Right;  Right reverse total shoulder  . Hematoma evacuation Right 05/11/2015    Procedure: EVACUATION HEMATOMA/RIGHT HIP HEMATOMA DRAINAGE WITH WOUND VAC;  Surgeon: Hessie Knows, MD;  Location: [facility];  Service: Orthopedics;  Laterality: Right;  . Incision and drainage hip Right 06/19/2015    Procedure: IRRIGATION AND DEBRIDEMENT HIP;  Surgeon: Hessie Knows, MD;  Location: [facility];  Service: Orthopedics;  Laterality: Right;  hemovac placement    Allergies: Allergies  Allergen Reactions  . Morphine And Related Other (See Comments)    Reaction:  Hallucinations   . Oxycodone Other (See Comments)    Reaction:  Unknown   . Sulfa Antibiotics Itching  . Tazobactam Other (See Comments)    Reaction:  Unknown   . Adhesive [Tape] Rash  . Isosorbide Rash    Home Medications: Prescriptions prior to admission  Medication Sig Dispense Refill Last Dose  . ARIPiprazole (ABILIFY) 5 MG tablet Take 1 tablet (5 mg total) by mouth daily. (Patient taking differently: Take 5 mg by mouth 2 (two) times daily. ) 30 tablet 0 06/28/2015 at Unknown time  . aspirin 81 MG chewable tablet Chew 81 mg by mouth daily.   06/28/2015 at Unknown time  . atorvastatin (LIPITOR) 20 MG tablet Take 20 mg by mouth at bedtime.    06/27/2015 at Unknown time  . carbidopa-levodopa (SINEMET CR) 50-200 MG per tablet Take 2 tablets by mouth 4 (four) times daily.   06/28/2015 at Unknown time  . carbidopa-levodopa-entacapone (STALEVO) 50-200-200 MG per tablet Take 1 tablet by mouth every evening.    06/27/2015 at Unknown time  . Cholecalciferol (RA VITAMIN D-3) 2000 UNITS CAPS Take 1 capsule by mouth daily.   06/28/2015 at Unknown time  . clindamycin (CLEOCIN) 300 MG capsule Take 300 mg by mouth 2 (two) times daily.   06/28/2015 at Unknown time  . docusate sodium 100 MG CAPS Take 100 mg by mouth 2 (two) times daily. 30 capsule 0 06/28/2015 at Unknown time  . Ferrous Fumarate (FERROCITE) 324 (106 FE) MG  TABS Take 1 tablet by mouth 2 (two) times daily.   06/28/2015 at Unknown time  . metoprolol (LOPRESSOR) 50 MG tablet Take 50 mg by mouth 2 (two) times daily.   06/28/2015 at Unknown time  . metroNIDAZOLE (FLAGYL) 250 MG tablet Take 1 tablet (250 mg total) by mouth every 8 (eight) hours. 30 tablet 0 06/28/2015 at Unknown time  . rifampin (RIFADIN) 300 MG capsule Take 300 mg by mouth 2 (two) times daily.   06/28/2015 at Unknown time  . rOPINIRole (REQUIP) 2 MG tablet Take 2 mg by mouth 3 (three) times daily.   06/28/2015 at Unknown time  . venlafaxine XR (EFFEXOR-XR) 150 MG 24 hr capsule Take 150 mg by mouth 2 (two) times daily.    06/28/2015  at Unknown time  . vitamin B-12 (CYANOCOBALAMIN) 1000 MCG tablet Take 1,000 mcg by mouth daily.    06/28/2015 at Unknown time  . acetaminophen (TYLENOL) 325 MG tablet Take 2 tablets (650 mg total) by mouth every 6 (six) hours as needed for mild pain (or Fever >/= 101). 30 tablet 0 PRN at PRN  . collagenase (SANTYL) ointment Apply topically daily. (Patient not taking: Reported on 06/27/2015) 15 g 0   . ferrous fumarate (HEMOCYTE - 106 MG FE) 325 (106 FE) MG TABS tablet Take 1 tablet (106 mg of iron total) by mouth 2 (two) times daily. (Patient not taking: Reported on 06/13/2015) 30 each 0 Not Taking  . senna (SENOKOT) 8.6 MG TABS tablet Take 1 tablet (8.6 mg total) by mouth daily as needed for mild constipation. (Patient not taking: Reported on 06/27/2015) 120 each 0 Past Month at unknown  . traMADol (ULTRAM) 50 MG tablet Take 1 tablet (50 mg total) by mouth every 6 (six) hours as needed for moderate pain. (Patient taking differently: Take 50 mg by mouth 3 (three) times daily as needed for moderate pain. ) 30 tablet 0 PRN at PRN   Home medication reconciliation was completed with the patient.   Scheduled Inpatient Medications:   . aspirin  81 mg Oral Daily  . atorvastatin  20 mg Oral QHS  . carbidopa-levodopa  2 tablet Oral QID  . clindamycin  300 mg Oral BID  . collagenase    Topical Daily  . feeding supplement (ENSURE ENLIVE)  237 mL Oral TID WC  . feeding supplement (PRO-STAT SUGAR FREE 64)  30 mL Oral Daily  . ferrous fumarate  1 tablet Oral BID  . levofloxacin (LEVAQUIN) IV  250 mg Intravenous Q24H  . metoprolol  50 mg Oral BID  . metroNIDAZOLE  250 mg Oral 3 times per day  . mirtazapine  7.5 mg Oral QHS  . pantoprazole  40 mg Oral BID  . QUEtiapine  100 mg Oral QHS  . QUEtiapine  25 mg Oral Q breakfast  . rifampin  300 mg Oral BID  . rOPINIRole  2 mg Oral TID  . venlafaxine XR  75 mg Oral BID    Continuous Inpatient Infusions:     PRN Inpatient Medications:  acetaminophen, haloperidol lactate, senna-docusate, traMADol  Family History: family history includes Hypertension in her mother.    Social History:   reports that she quit smoking about 30 years ago. Her smoking use included Cigarettes. She has a 5 pack-year smoking history. She does not have any smokeless tobacco history on file. She reports that she drinks alcohol. She reports that she does not use illicit drugs. .   Review of Systems: Constitutional: Weight is stable.  Eyes: No changes in vision. ENT: No oral lesions, sore throat.  GI: see HPI.  Heme/Lymph: No easy bruising.  CV: No chest pain.  GU: No hematuria.  Integumentary: No rashes.  Neuro: No headaches.  Psych: No depression/anxiety.  Endocrine: No heat/cold intolerance.  Allergic/Immunologic: No urticaria.  Resp: No cough, SOB.  Musculoskeletal: No joint swelling.    Physical Examination: BP 96/53 mmHg  Pulse 117  Temp(Src) 98.8 F (37.1 C) (Oral)  Resp 16  Ht 5\' 6"  (1.676 m)  Wt 55.339 kg (122 lb)  BMI 19.70 kg/m2  SpO2 98% Gen: NAD,  Very pleasant, very thin female.  Scattered historian.  History was also given via phone by daughter and lung HEENT: PEERLA, EOMI, Neck: supple, no JVD or thyromegaly  Chest: CTA bilaterally, no wheezes, crackles, or other adventitious sounds CV: RRR, no m/g/c/r Abd: soft, NT,  ND, +BS in all four quadrants; no HSM, guarding, ridigity, or rebound tenderness Ext: no edema, well perfused with 2+ pulses, Lymph: no LAD Rectal exam: stool guaiac positive.   Data: Lab Results  Component Value Date   WBC 6.0 07/03/2015   HGB 9.2* 07/03/2015   HCT 28.4* 07/03/2015   MCV 88.3 07/03/2015   PLT 431 07/03/2015    Recent Labs Lab 06/29/15 0436 06/30/15 0355 07/03/15 0743  HGB 7.2* 9.3* 9.2*   Lab Results  Component Value Date   NA 140 07/03/2015   K 4.4 07/03/2015   CL 107 07/03/2015   CO2 26 07/03/2015   BUN 18 07/03/2015   CREATININE 0.62 07/03/2015   Lab Results  Component Value Date   ALT <5* 06/17/2015   AST 12* 06/17/2015   ALKPHOS 107 06/17/2015   BILITOT 0.5 06/17/2015    Recent Labs Lab 06/27/15 1330  INR 1.18   Assessment/Plan: Ms. Hayse is a 74 y.o. female with heme positive stool and acute blood loss anemia  Recommendations: We agree with serial hemoglobin.  We recommend H pylori serology.  We agree with transfusing as needed.  She is not a candidate at this time for further  Evaluation of the heme-positive stool.  The stool in the vault was brown, did not appear to be from the vagina.  I spoke with daughter in law and she agreed that patient was not a candidate at this time for further investigation of the heme-positive stool.  We will continue to follow with you. Thank you for the consult. Please call with questions or concerns.  Salvadore Farber, PA-C  I personally performed these services.

## 2015-07-03 NOTE — Consult Note (Signed)
Subjective: Patient seen for Hemoccult-positive stool. We see full GI consult by Mrs. Richards  Patient seen and examined, chart reviewed.  Objective: Vital signs in last 24 hours: Temp:  [98.8 F (37.1 C)-102.4 F (39.1 C)] 102.4 F (39.1 C) (07/12 2031) Pulse Rate:  [85-126] 126 (07/12 2031) Resp:  [16-18] 18 (07/12 2031) BP: (96-114)/(51-70) 114/66 mmHg (07/12 2031) SpO2:  [95 %-100 %] 95 % (07/12 2031) Weight:  [55.339 kg (122 lb)] 55.339 kg (122 lb) (07/12 0520) Blood pressure 114/66, pulse 126, temperature 102.4 F (39.1 C), temperature source Oral, resp. rate 18, height 5\' 6"  (1.676 m), weight 55.339 kg (122 lb), SpO2 95 %.   Intake/Output from previous day: 07/11 0701 - 07/12 0700 In: -  Out: 300 [Urine:300]  Intake/Output this shift:     General appearance:  Thin elderly cachectic-appearing Resp:  Clear to auscultation Cardio:  Regular rate and rhythm GI:  Soft nontender nondistended bowel sounds positive normoactive Extremities:     Lab Results: Results for orders placed or performed during the hospital encounter of 06/28/15 (from the past 24 hour(s))  Urine culture     Status: None (Preliminary result)   Collection Time: 07/02/15 10:41 PM  Result Value Ref Range   Specimen Description URINE, CLEAN CATCH    Special Requests clindamycin, metronidazole, rifampin    Culture TOO YOUNG TO READ    Report Status PENDING   Basic metabolic panel     Status: Abnormal   Collection Time: 07/03/15  5:37 AM  Result Value Ref Range   Sodium 140 135 - 145 mmol/L   Potassium 4.4 3.5 - 5.1 mmol/L   Chloride 107 101 - 111 mmol/L   CO2 26 22 - 32 mmol/L   Glucose, Bld 109 (H) 65 - 99 mg/dL   BUN 18 6 - 20 mg/dL   Creatinine, Ser 0.62 0.44 - 1.00 mg/dL   Calcium 8.1 (L) 8.9 - 10.3 mg/dL   GFR calc non Af Amer >60 >60 mL/min   GFR calc Af Amer >60 >60 mL/min   Anion gap 7 5 - 15  CBC     Status: Abnormal   Collection Time: 07/03/15  7:43 AM  Result Value Ref Range   WBC 6.0 3.6 - 11.0 K/uL   RBC 3.21 (L) 3.80 - 5.20 MIL/uL   Hemoglobin 9.2 (L) 12.0 - 16.0 g/dL   HCT 28.4 (L) 35.0 - 47.0 %   MCV 88.3 80.0 - 100.0 fL   MCH 28.8 26.0 - 34.0 pg   MCHC 32.6 32.0 - 36.0 g/dL   RDW 20.0 (H) 11.5 - 14.5 %   Platelets 431 150 - 440 K/uL      Recent Labs  07/03/15 0743  WBC 6.0  HGB 9.2*  HCT 28.4*  PLT 431   BMET  Recent Labs  07/03/15 0537  NA 140  K 4.4  CL 107  CO2 26  GLUCOSE 109*  BUN 18  CREATININE 0.62  CALCIUM 8.1*   LFT No results for input(s): PROT, ALBUMIN, AST, ALT, ALKPHOS, BILITOT, BILIDIR, IBILI in the last 72 hours. PT/INR No results for input(s): LABPROT, INR in the last 72 hours. Hepatitis Panel No results for input(s): HEPBSAG, HCVAB, HEPAIGM, HEPBIGM in the last 72 hours. C-Diff No results for input(s): CDIFFTOX in the last 72 hours. No results for input(s): CDIFFPCR in the last 72 hours.   Studies/Results: Dg Chest Port 1 View  07/02/2015   CLINICAL DATA:  Shortness of Breath  EXAM: PORTABLE  CHEST - 1 VIEW  COMPARISON:  None.  FINDINGS: Patient is rotated. There is no edema or consolidation. Heart is mildly enlarged with pulmonary vascularity within normal limits. No adenopathy. There is atherosclerotic change in aorta. There is a total shoulder replacement on the right. There is evidence of an old healed fracture of the proximal left humeral metaphysis. There is also an old healed fracture of the lateral left clavicle. There are surgical clips in the left axillary region with evidence of prior left mastectomy.  IMPRESSION: No edema or consolidation.  Mild cardiac enlargement.   Electronically Signed   By: Lowella Grip III M.D.   On: 07/02/2015 13:33    Scheduled Inpatient Medications:   . aspirin  81 mg Oral Daily  . atorvastatin  20 mg Oral QHS  . carbidopa-levodopa  2 tablet Oral QID  . clindamycin  300 mg Oral BID  . collagenase   Topical Daily  . feeding supplement (ENSURE ENLIVE)  237 mL Oral TID WC   . feeding supplement (PRO-STAT SUGAR FREE 64)  30 mL Oral Daily  . ferrous fumarate  1 tablet Oral BID  . levofloxacin (LEVAQUIN) IV  250 mg Intravenous Q24H  . metoprolol  50 mg Oral BID  . metroNIDAZOLE  250 mg Oral 3 times per day  . mirtazapine  15 mg Oral QHS  . pantoprazole  40 mg Oral BID  . QUEtiapine  150 mg Oral QHS  . QUEtiapine  25 mg Oral Q breakfast  . rifampin  300 mg Oral BID  . rOPINIRole  2 mg Oral TID  . venlafaxine XR  75 mg Oral BID    Continuous Inpatient Infusions:     PRN Inpatient Medications:  acetaminophen, haloperidol lactate, senna-docusate, traMADol  Miscellaneous:   Assessment:  1. Hemoccult-positive stool. Hemogram has been stable after transfusion for hip lesion. Digital rectal examination shows a brown stool with several small hemorrhoids, no evidence of melena or black stool. She is not a candidate currently for sedated luminal evaluation.  Plan:  1. Serial hemoglobin transfuse as needed 2. Helicobacter pylori serology 3. Continue PPI as you are. 4. We'll follow with you for now.  Lollie Sails MD 07/03/2015, 10:34 PM

## 2015-07-03 NOTE — Consult Note (Signed)
[facility] Consult   Reason for Consult:  Follow-up for this patient with depression and psychotic symptoms Referring Physician:  gouru Patient Identification: Sabrina Duncan MRN:  [id number] Principal Diagnosis: Delayed surgical wound healing Diagnosis:  Major depression with psychotic features Patient Active Problem List   Diagnosis Date Noted  . Subacute delirium [F05] 07/02/2015  . 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 Delayed surgical wound healing [T81.89XA] 06/28/2015  . Pressure ulcer [L89.90] 06/18/2015  . Hyponatremia [E87.1] 06/17/2015  . Arthritis [M19.90] 06/13/2015  . Benign breast lumps [N60.29] 06/13/2015  . Clinical depression [F32.9] 06/13/2015  . Hemorrhoid [K64.9] 06/13/2015  . Protein-calorie malnutrition, severe [E43] 05/28/2015  . Anemia [D64.9] 05/27/2015  . Acute blood loss anemia [D62] 05/14/2015  . Hematoma [T14.8] 05/14/2015  . Parkinson's disease [G20] 05/14/2015  . Generalized anxiety disorder [F41.1] 05/14/2015  . Coronary atherosclerosis [I25.10]   . Essential hypertension [I10]   . HLD (hyperlipidemia) [E78.5]   . Thoracic ascending aortic aneurysm [I71.2]     Total Time spent with patient: 45 minutes  Subjective:   Sabrina Duncan is a 74 y.o. female patient admitted with patient is here with delayed surgical wound healing. Patient tells me she is still feeling worried and anxious and feels like she is having hallucinations.[name]  HPI:  Patient today says she is still feeling anxious. She claims she is still having crying spells during the day. She denies any suicidal ideation. She says she is not as specifically concerned that people are trying to poison her or hurt her as she was before. She feels like she is still having hallucinations and confusion at night. Nothing new or any worse than it was previously. HPI Elements:   Quality:  Confusion and psychotic symptoms  along with depressed mood. Severity:  Improving slightly compared to yesterday. Timing:  Present intermittently for months. Duration:  Have been going on now for at least several days with worsening since she's been in the hospital. Context:  Chronic recurrent medical problems.  Past Medical History:  Past Medical History  Diagnosis Date  . Depressive disorder, not elsewhere classified   . Unspecified osteomyelitis, site unspecified   . Polymyalgia rheumatica   . Paralysis agitans   . Osteoarthrosis, unspecified whether generalized or localized, unspecified site   . Muscle weakness (generalized)   . Difficulty in walking(719.7)   . Parkinson disease   . PMR (polymyalgia rheumatica)   . Coronary atherosclerosis of unspecified type of vessel, native or graft     2007: LAD PCI with a BMS, 80% distal LCX stenosis treated medically. Most recent stress test in 04/2012 showed distal anterio/apical scar? with no ischemia, EF 53%.   [name] Unspecified essential hypertension   . Other and unspecified hyperlipidemia   . Thoracic ascending aortic aneurysm     4.0 cm  . Complication of anesthesia     2013- hallucinations- [facility]. (-spinal anesth. 2013- no problems   redo- hip- L)  . Esophageal reflux     pt. reports that its resolved   . Neuromuscular disorder     parkinson's - [facility] - neurologist   . Cancer     left breast cancer- lumpectomy, chemo, radiation, tamoxifen , SLNBx  . Anemia, unspecified     bld. transfusion- 02/2013    Past Surgical History  Procedure Laterality Date  . Knee surgery Left   . Varicose vein surgery      both legs   .  Melanoma excision Right     elbow  . Knee surgery Left   . Hip replacement Bilateral   . Foot surgery Right 2013  . Rotator cuff repair Right   . Cardiac catheterization  2007    [place], [place]; x1 stent  . Cardiac catheterization  9/14    [facility]  . Breast lumpectomy Left 1997  . Joint replacement      L-hipx5, R  hipx2, Lkneex1,   . Tonsillectomy    . Cholecystectomy  2005    open repair - for gangernous   . Peripherally inserted central catheter insertion      for IV antibiotics related to repeated infection, post hip replacement  . Reverse shoulder arthroplasty Right 09/29/2013    Procedure: REVERSE SHOULDER ARTHROPLASTY;  Surgeon: Nita Sells, MD;  Location: [facility];  Service: Orthopedics;  Laterality: Right;  Right reverse total shoulder  . Hematoma evacuation Right 05/11/2015    Procedure: EVACUATION HEMATOMA/RIGHT HIP HEMATOMA DRAINAGE WITH WOUND VAC;  Surgeon: Hessie Knows, MD;  Location: [facility];  Service: Orthopedics;  Laterality: Right;  . Incision and drainage hip Right 06/19/2015    Procedure: IRRIGATION AND DEBRIDEMENT HIP;  Surgeon: Hessie Knows, MD;  Location: [facility];  Service: Orthopedics;  Laterality: Right;  hemovac placement   Family History:  Family History  Problem Relation Age of Onset  . Hypertension Mother    Social History:  History  Alcohol Use  . Yes    Comment: rarely     History  Drug Use No    History   Social History  . Marital Status: Widowed    Spouse Name: N/A  . Number of Children: N/A  . Years of Education: N/A   Social History Main Topics  . Smoking status: Former Smoker -- 0.50 packs/day for 10 years    Types: Cigarettes    Quit date: 09/26/1984  . Smokeless tobacco: Not on file  . Alcohol Use: Yes     Comment: rarely  . Drug Use: No  . Sexual Activity: Not on file   Other Topics Concern  . None   Social History Narrative   Additional Social History:                          Allergies:   Allergies  Allergen Reactions  . Morphine And Related Other (See Comments)    Reaction:  Hallucinations   . Oxycodone Other (See Comments)    Reaction:  Unknown   . Sulfa Antibiotics Itching  . Tazobactam Other (See Comments)    Reaction:  Unknown   . Adhesive [Tape] Rash  . Isosorbide Rash    Labs:  Results for  orders placed or performed during the hospital encounter of 06/28/15 (from the past 48 hour(s))  Urinalysis complete, with microscopic ([facility] only)     Status: Abnormal   Collection Time: 07/02/15 11:28 AM  Result Value Ref Range   Color, Urine AMBER (A) YELLOW   APPearance CLEAR (A) CLEAR   Glucose, UA NEGATIVE NEGATIVE mg/dL   Bilirubin Urine NEGATIVE NEGATIVE   Ketones, ur TRACE (A) NEGATIVE mg/dL   Specific Gravity, Urine 1.019 1.005 - 1.030   Hgb urine dipstick 1+ (A) NEGATIVE   pH 7.0 5.0 - 8.0   Protein, ur NEGATIVE NEGATIVE mg/dL   Nitrite NEGATIVE NEGATIVE   Leukocytes, UA 2+ (A) NEGATIVE   RBC / HPF 0-5 0 - 5 RBC/hpf   WBC, UA 6-30 0 -  5 WBC/hpf   Bacteria, UA RARE (A) NONE SEEN   Squamous Epithelial / LPF NONE SEEN NONE SEEN  Urine culture     Status: None (Preliminary result)   Collection Time: 07/02/15 10:41 PM  Result Value Ref Range   Specimen Description URINE, CLEAN CATCH    Special Requests clindamycin, metronidazole, rifampin    Culture TOO YOUNG TO READ    Report Status PENDING   Basic metabolic panel     Status: Abnormal   Collection Time: 07/03/15  5:37 AM  Result Value Ref Range   Sodium 140 135 - 145 mmol/L   Potassium 4.4 3.5 - 5.1 mmol/L   Chloride 107 101 - 111 mmol/L   CO2 26 22 - 32 mmol/L   Glucose, Bld 109 (H) 65 - 99 mg/dL   BUN 18 6 - 20 mg/dL   Creatinine, Ser 0.62 0.44 - 1.00 mg/dL   Calcium 8.1 (L) 8.9 - 10.3 mg/dL   GFR calc non Af Amer >60 >60 mL/min   GFR calc Af Amer >60 >60 mL/min    Comment: (NOTE) The eGFR has been calculated using the CKD EPI equation. This calculation has not been validated in all clinical situations. eGFR's persistently <60 mL/min signify possible Chronic Kidney Disease.    Anion gap 7 5 - 15  CBC     Status: Abnormal   Collection Time: 07/03/15  7:43 AM  Result Value Ref Range   WBC 6.0 3.6 - 11.0 K/uL   RBC 3.21 (L) 3.80 - 5.20 MIL/uL   Hemoglobin 9.2 (L) 12.0 - 16.0 g/dL   HCT 28.4 (L) 35.0 - 47.0 %    MCV 88.3 80.0 - 100.0 fL   MCH 28.8 26.0 - 34.0 pg   MCHC 32.6 32.0 - 36.0 g/dL   RDW 20.0 (H) 11.5 - 14.5 %   Platelets 431 150 - 440 K/uL    Vitals: Blood pressure 96/53, pulse 117, temperature 98.8 F (37.1 C), temperature source Oral, resp. rate 16, height 5' 6"  (1.676 m), weight 55.339 kg (122 lb), SpO2 98 %.  Risk to Self: Is patient at risk for suicide?: No Risk to Others:   Prior Inpatient Therapy:   Prior Outpatient Therapy:    Current Facility-Administered Medications  Medication Dose Route Frequency Provider Last Rate Last Dose  . acetaminophen (TYLENOL) tablet 650 mg  650 mg Oral Q6H PRN Lance Coon, MD   650 mg at 07/02/15 2126  . aspirin chewable tablet 81 mg  81 mg Oral Daily Lance Coon, MD   81 mg at 07/02/15 0946  . atorvastatin (LIPITOR) tablet 20 mg  20 mg Oral QHS Lance Coon, MD   20 mg at 07/02/15 2126  . carbidopa-levodopa (SINEMET CR) 50-200 MG per tablet controlled release 2 tablet  2 tablet Oral QID Lance Coon, MD   2 tablet at 07/03/15 1518  . clindamycin (CLEOCIN) capsule 300 mg  300 mg Oral BID Lance Coon, MD   300 mg at 07/03/15 1126  . collagenase (SANTYL) ointment   Topical Daily Aruna Gouru, MD      . feeding supplement (ENSURE ENLIVE) (ENSURE ENLIVE) liquid 237 mL  237 mL Oral TID WC Nicholes Mango, MD   237 mL at 07/03/15 1336  . feeding supplement (PRO-STAT SUGAR FREE 64) liquid 30 mL  30 mL Oral Daily Aruna Gouru, MD      . ferrous fumarate (HEMOCYTE - 106 mg FE) tablet 106 mg of iron  1 tablet Oral BID Illene Silver  Gouru, MD   106 mg of iron at 07/02/15 2127  . haloperidol lactate (HALDOL) injection 2 mg  2 mg Intravenous Q6H PRN Lance Coon, MD   2 mg at 07/03/15 0507  . Levofloxacin (LEVAQUIN) IVPB 250 mg  250 mg Intravenous Q24H Nicholes Mango, MD   250 mg at 07/02/15 2125  . metoprolol (LOPRESSOR) tablet 50 mg  50 mg Oral BID Lance Coon, MD   50 mg at 07/01/15 2202  . metroNIDAZOLE (FLAGYL) tablet 250 mg  250 mg Oral 3 times per day Lance Coon, MD   250 mg at 07/03/15 1518  . mirtazapine (REMERON SOL-TAB) disintegrating tablet 15 mg  15 mg Oral QHS Gonzella Lex, MD      . pantoprazole (PROTONIX) EC tablet 40 mg  40 mg Oral BID Nicholes Mango, MD   40 mg at 07/02/15 2128  . QUEtiapine (SEROQUEL) tablet 150 mg  150 mg Oral QHS Gonzella Lex, MD      . QUEtiapine (SEROQUEL) tablet 25 mg  25 mg Oral Q breakfast Gonzella Lex, MD   25 mg at 07/03/15 0827  . rifampin (RIFADIN) capsule 300 mg  300 mg Oral BID Lance Coon, MD   300 mg at 07/03/15 1124  . rOPINIRole (REQUIP) tablet 2 mg  2 mg Oral TID Lance Coon, MD   2 mg at 07/03/15 1124  . senna-docusate (Senokot-S) tablet 1 tablet  1 tablet Oral QHS PRN Lance Coon, MD      . traMADol Veatrice Bourbon) tablet 50 mg  50 mg Oral Q6H PRN Lance Coon, MD   50 mg at 07/01/15 2203  . venlafaxine XR (EFFEXOR-XR) 24 hr capsule 75 mg  75 mg Oral BID Gonzella Lex, MD   75 mg at 07/03/15 1127    Musculoskeletal: Strength & Muscle Tone: decreased Gait & Station: unsteady Patient leans: N/A  Psychiatric Specialty Exam: Physical Exam  Constitutional: She appears well-developed and well-nourished. She appears ill.  HENT:  Head: Normocephalic and atraumatic.  Eyes: Conjunctivae are normal. Pupils are equal, round, and reactive to light.  Neck: Normal range of motion.  Cardiovascular: Normal heart sounds.   Respiratory: Effort normal.  GI: Soft.  Musculoskeletal: Normal range of motion.  Neurological: She is alert. She displays tremor.  Skin: Skin is warm and dry.  Psychiatric: Her speech is normal and behavior is normal. Her mood appears anxious. Thought content is paranoid. Cognition and memory are impaired. She exhibits abnormal recent memory.    Review of Systems  Constitutional: Positive for malaise/fatigue.  HENT: Negative.   Eyes: Negative.   Respiratory: Negative.   Cardiovascular: Negative.   Gastrointestinal: Negative.   Musculoskeletal: Negative.   Skin: Negative.    Neurological: Positive for weakness.  Psychiatric/Behavioral: Positive for depression. The patient is nervous/anxious.     Blood pressure 96/53, pulse 117, temperature 98.8 F (37.1 C), temperature source Oral, resp. rate 16, height 5' 6"  (1.676 m), weight 55.339 kg (122 lb), SpO2 98 %.Body mass index is 19.7 kg/(m^2).  General Appearance: Disheveled  Eye Sport and exercise psychologist::  Fair  Speech:  Normal Rate  Volume:  Decreased  Mood:  Anxious  Affect:  Blunt  Thought Process:  Tangential  Orientation:  Full (Time, Place, and Person)  Thought Content:  Hallucinations: Auditory Visual  Suicidal Thoughts:  No  Homicidal Thoughts:  No  Memory:  Immediate;   Good Recent;   Fair Remote;   Fair  Judgement:  Impaired  Insight:  Shallow  Psychomotor  Activity:  Decreased  Concentration:  Fair  Recall:  [employer] of Knowledge:Fair  Language: Fair  Akathisia:  No  Handed:  Right  AIMS (if indicated):     Assets:  Communication Skills Desire for Improvement Housing Social Support  ADL's:  Impaired  Cognition: Impaired,  Mild  Sleep:      Medical Decision Making: Established Problem, Stable/Improving (1), Review of Psycho-Social Stressors (1), Review or order clinical lab tests (1), Review of Medication Regimen & Side Effects (2) and Review of New Medication or Change in Dosage (2)  Treatment Plan Summary: Medication management and Plan After meeting with the patient yesterday I spoke with her daughter-in-law at length. I made some medication changes as detailed yesterday. Patient today seems to be possibly slightly improved and does not appear to be any worse than she was yesterday. I am going to increase the mirtazapine to 15 mg at night and also increase her nighttime Seroquel to 150 mg while continuing the other changes. Patient was advised of this and was agreeable. Continue daily follow-up. Psychoeducation completed.  Plan:  See note above Disposition: Continue treatment on the medical  surgical service  Alethia Berthold 07/03/2015 4:58 PM

## 2015-07-04 ENCOUNTER — Inpatient Hospital Stay: Payer: Medicare Other

## 2015-07-04 ENCOUNTER — Inpatient Hospital Stay (HOSPITAL_COMMUNITY)
Admission: AD | Admit: 2015-07-04 | Discharge: 2015-07-10 | DRG: 871 | Disposition: A | Discharge status: Hospice | Payer: Medicare Other | Source: Other Acute Inpatient Hospital | Attending: Internal Medicine | Admitting: Internal Medicine

## 2015-07-04 DIAGNOSIS — L8942 Pressure ulcer of contiguous site of back, buttock and hip, stage 2: Secondary | ICD-10-CM | POA: Diagnosis present

## 2015-07-04 DIAGNOSIS — K5900 Constipation, unspecified: Secondary | ICD-10-CM | POA: Diagnosis not present

## 2015-07-04 DIAGNOSIS — Z85820 Personal history of malignant melanoma of skin: Secondary | ICD-10-CM

## 2015-07-04 DIAGNOSIS — L89024 Pressure ulcer of left elbow, stage 4: Secondary | ICD-10-CM | POA: Diagnosis present

## 2015-07-04 DIAGNOSIS — T8451XD Infection and inflammatory reaction due to internal right hip prosthesis, subsequent encounter: Secondary | ICD-10-CM

## 2015-07-04 DIAGNOSIS — Z87891 Personal history of nicotine dependence: Secondary | ICD-10-CM | POA: Diagnosis not present

## 2015-07-04 DIAGNOSIS — R451 Restlessness and agitation: Secondary | ICD-10-CM | POA: Diagnosis not present

## 2015-07-04 DIAGNOSIS — R52 Pain, unspecified: Secondary | ICD-10-CM

## 2015-07-04 DIAGNOSIS — A419 Sepsis, unspecified organism: Secondary | ICD-10-CM | POA: Diagnosis present

## 2015-07-04 DIAGNOSIS — B952 Enterococcus as the cause of diseases classified elsewhere: Secondary | ICD-10-CM | POA: Diagnosis not present

## 2015-07-04 DIAGNOSIS — Y95 Nosocomial condition: Secondary | ICD-10-CM | POA: Diagnosis not present

## 2015-07-04 DIAGNOSIS — F333 Major depressive disorder, recurrent, severe with psychotic symptoms: Secondary | ICD-10-CM | POA: Diagnosis present

## 2015-07-04 DIAGNOSIS — R509 Fever, unspecified: Secondary | ICD-10-CM | POA: Diagnosis present

## 2015-07-04 DIAGNOSIS — L89154 Pressure ulcer of sacral region, stage 4: Secondary | ICD-10-CM | POA: Diagnosis not present

## 2015-07-04 DIAGNOSIS — L89611 Pressure ulcer of right heel, stage 1: Secondary | ICD-10-CM | POA: Diagnosis not present

## 2015-07-04 DIAGNOSIS — Z96611 Presence of right artificial shoulder joint: Secondary | ICD-10-CM | POA: Diagnosis not present

## 2015-07-04 DIAGNOSIS — Z923 Personal history of irradiation: Secondary | ICD-10-CM

## 2015-07-04 DIAGNOSIS — L89621 Pressure ulcer of left heel, stage 1: Secondary | ICD-10-CM | POA: Diagnosis present

## 2015-07-04 DIAGNOSIS — Z853 Personal history of malignant neoplasm of breast: Secondary | ICD-10-CM

## 2015-07-04 DIAGNOSIS — E43 Unspecified severe protein-calorie malnutrition: Secondary | ICD-10-CM | POA: Diagnosis present

## 2015-07-04 DIAGNOSIS — I10 Essential (primary) hypertension: Secondary | ICD-10-CM | POA: Diagnosis not present

## 2015-07-04 DIAGNOSIS — B9689 Other specified bacterial agents as the cause of diseases classified elsewhere: Secondary | ICD-10-CM | POA: Diagnosis present

## 2015-07-04 DIAGNOSIS — Z96652 Presence of left artificial knee joint: Secondary | ICD-10-CM | POA: Diagnosis present

## 2015-07-04 DIAGNOSIS — J189 Pneumonia, unspecified organism: Secondary | ICD-10-CM | POA: Diagnosis not present

## 2015-07-04 DIAGNOSIS — Y831 Surgical operation with implant of artificial internal device as the cause of abnormal reaction of the patient, or of later complication, without mention of misadventure at the time of the procedure: Secondary | ICD-10-CM | POA: Diagnosis not present

## 2015-07-04 DIAGNOSIS — N390 Urinary tract infection, site not specified: Secondary | ICD-10-CM | POA: Diagnosis present

## 2015-07-04 DIAGNOSIS — E869 Volume depletion, unspecified: Secondary | ICD-10-CM | POA: Diagnosis present

## 2015-07-04 DIAGNOSIS — R000 Tachycardia, unspecified: Secondary | ICD-10-CM | POA: Diagnosis present

## 2015-07-04 DIAGNOSIS — Z66 Do not resuscitate: Secondary | ICD-10-CM | POA: Diagnosis not present

## 2015-07-04 DIAGNOSIS — R569 Unspecified convulsions: Secondary | ICD-10-CM

## 2015-07-04 DIAGNOSIS — T8189XD Other complications of procedures, not elsewhere classified, subsequent encounter: Secondary | ICD-10-CM | POA: Diagnosis not present

## 2015-07-04 DIAGNOSIS — Z792 Long term (current) use of antibiotics: Secondary | ICD-10-CM

## 2015-07-04 DIAGNOSIS — Z955 Presence of coronary angioplasty implant and graft: Secondary | ICD-10-CM | POA: Diagnosis not present

## 2015-07-04 DIAGNOSIS — R0602 Shortness of breath: Secondary | ICD-10-CM

## 2015-07-04 DIAGNOSIS — Z79899 Other long term (current) drug therapy: Secondary | ICD-10-CM | POA: Diagnosis not present

## 2015-07-04 DIAGNOSIS — G20 Parkinson's disease: Secondary | ICD-10-CM | POA: Diagnosis present

## 2015-07-04 DIAGNOSIS — I2510 Atherosclerotic heart disease of native coronary artery without angina pectoris: Secondary | ICD-10-CM | POA: Diagnosis not present

## 2015-07-04 DIAGNOSIS — T8189XS Other complications of procedures, not elsewhere classified, sequela: Secondary | ICD-10-CM

## 2015-07-04 DIAGNOSIS — D62 Acute posthemorrhagic anemia: Secondary | ICD-10-CM | POA: Diagnosis not present

## 2015-07-04 DIAGNOSIS — N823 Fistula of vagina to large intestine: Secondary | ICD-10-CM | POA: Diagnosis not present

## 2015-07-04 DIAGNOSIS — R6520 Severe sepsis without septic shock: Secondary | ICD-10-CM | POA: Diagnosis not present

## 2015-07-04 DIAGNOSIS — T8189XA Other complications of procedures, not elsewhere classified, initial encounter: Secondary | ICD-10-CM | POA: Diagnosis present

## 2015-07-04 DIAGNOSIS — T148XXA Other injury of unspecified body region, initial encounter: Secondary | ICD-10-CM

## 2015-07-04 DIAGNOSIS — Z96642 Presence of left artificial hip joint: Secondary | ICD-10-CM | POA: Diagnosis present

## 2015-07-04 DIAGNOSIS — Z9221 Personal history of antineoplastic chemotherapy: Secondary | ICD-10-CM | POA: Diagnosis not present

## 2015-07-04 DIAGNOSIS — Z7982 Long term (current) use of aspirin: Secondary | ICD-10-CM

## 2015-07-04 DIAGNOSIS — Z515 Encounter for palliative care: Secondary | ICD-10-CM

## 2015-07-04 LAB — CBC
HCT: 26.3 % — ABNORMAL LOW (ref 35.0–47.0)
Hemoglobin: 8.9 g/dL — ABNORMAL LOW (ref 12.0–16.0)
MCH: 30.1 pg (ref 26.0–34.0)
MCHC: 33.8 g/dL (ref 32.0–36.0)
MCV: 89.1 fL (ref 80.0–100.0)
PLATELETS: 379 10*3/uL (ref 150–440)
RBC: 2.96 MIL/uL — ABNORMAL LOW (ref 3.80–5.20)
RDW: 20.6 % — ABNORMAL HIGH (ref 11.5–14.5)
WBC: 3.1 10*3/uL — ABNORMAL LOW (ref 3.6–11.0)

## 2015-07-04 LAB — BASIC METABOLIC PANEL
Anion gap: 3 — ABNORMAL LOW (ref 5–15)
BUN: 15 mg/dL (ref 6–20)
CO2: 28 mmol/L (ref 22–32)
Calcium: 7.9 mg/dL — ABNORMAL LOW (ref 8.9–10.3)
Chloride: 105 mmol/L (ref 101–111)
Creatinine, Ser: 0.61 mg/dL (ref 0.44–1.00)
GFR calc non Af Amer: >60 mL/min (ref >60)
Glucose, Bld: 119 mg/dL — ABNORMAL HIGH (ref 65–99)
Potassium: 3.8 mmol/L (ref 3.5–5.1)
Sodium: 136 mmol/L (ref 135–145)

## 2015-07-04 LAB — GLUCOSE, CAPILLARY: GLUCOSE-CAPILLARY: 121 mg/dL — AB (ref 65–99)

## 2015-07-04 LAB — LACTIC ACID, PLASMA: LACTIC ACID, VENOUS: 1.4 mmol/L (ref 0.5–2.0)

## 2015-07-04 LAB — MRSA PCR SCREENING: MRSA BY PCR: NEGATIVE

## 2015-07-04 LAB — SEDIMENTATION RATE: Sed Rate: 128 mm/hr — ABNORMAL HIGH (ref 0–30)

## 2015-07-04 MED ORDER — ENTACAPONE 200 MG PO TABS
200.0000 mg | ORAL_TABLET | Freq: Every day | ORAL | Status: DC
  Administered 2015-07-05 – 2015-07-08 (×4): 200 mg via ORAL
  Filled 2015-07-04 (×6): qty 1

## 2015-07-04 MED ORDER — CLINDAMYCIN HCL 300 MG PO CAPS: 300.0000 mg | ORAL_CAPSULE | Freq: Two times a day (BID) | ORAL | Status: DC

## 2015-07-04 MED ORDER — RIFAMPIN 300 MG PO CAPS
300.0000 mg | ORAL_CAPSULE | Freq: Two times a day (BID) | ORAL | Status: DC
  Administered 2015-07-04 – 2015-07-09 (×9): 300 mg via ORAL
  Filled 2015-07-04 (×13): qty 1

## 2015-07-04 MED ORDER — CARBIDOPA-LEVODOPA-ENTACAPONE 50-200-200 MG PO TABS
1.0000 | ORAL_TABLET | Freq: Every evening | ORAL | Status: DC
  Filled 2015-07-04 (×2): qty 1

## 2015-07-04 MED ORDER — SENNOSIDES-DOCUSATE SODIUM 8.6-50 MG PO TABS
1.0000 | ORAL_TABLET | Freq: Every evening | ORAL | Status: DC | PRN
  Filled 2015-07-04: qty 1

## 2015-07-04 MED ORDER — CARBIDOPA-LEVODOPA ER 50-200 MG PO TBCR
2.0000 | EXTENDED_RELEASE_TABLET | Freq: Four times a day (QID) | ORAL | Status: DC
  Administered 2015-07-05 – 2015-07-09 (×15): 2 via ORAL
  Filled 2015-07-04 (×25): qty 2

## 2015-07-04 MED ORDER — ONDANSETRON HCL 4 MG/2ML IJ SOLN: 4.0000 mg | Freq: Four times a day (QID) | INTRAMUSCULAR | Status: DC | PRN

## 2015-07-04 MED ORDER — ACETAMINOPHEN 650 MG RE SUPP: 650.0000 mg | Freq: Four times a day (QID) | RECTAL | Status: DC | PRN

## 2015-07-04 MED ORDER — QUETIAPINE FUMARATE 50 MG PO TABS
150.0000 mg | ORAL_TABLET | Freq: Every day | ORAL | Status: DC
  Administered 2015-07-04 – 2015-07-07 (×4): 150 mg via ORAL
  Filled 2015-07-04 (×7): qty 1

## 2015-07-04 MED ORDER — ATORVASTATIN CALCIUM 20 MG PO TABS
20.0000 mg | ORAL_TABLET | Freq: Every day | ORAL | Status: DC
  Administered 2015-07-04 – 2015-07-07 (×4): 20 mg via ORAL
  Filled 2015-07-04 (×6): qty 1

## 2015-07-04 MED ORDER — SODIUM CHLORIDE 0.9 % IV SOLN
INTRAVENOUS | Status: DC
  Administered 2015-07-04 – 2015-07-09 (×5): via INTRAVENOUS

## 2015-07-04 MED ORDER — FE FUMARATE-B12-VIT C-FA-IFC PO CAPS
1.0000 | ORAL_CAPSULE | Freq: Two times a day (BID) | ORAL | Status: DC
  Administered 2015-07-04 – 2015-07-08 (×8): 1 via ORAL
  Filled 2015-07-04 (×11): qty 1

## 2015-07-04 MED ORDER — SODIUM CHLORIDE 0.9 % IV SOLN
1000.0000 mg | Freq: Two times a day (BID) | INTRAVENOUS | Status: DC
  Administered 2015-07-04: 1000 mg via INTRAVENOUS
  Filled 2015-07-04 (×3): qty 10

## 2015-07-04 MED ORDER — FE FUMARATE-B12-VIT C-FA-IFC PO CAPS
1.0000 | ORAL_CAPSULE | Freq: Two times a day (BID) | ORAL | Status: DC
  Filled 2015-07-04: qty 1

## 2015-07-04 MED ORDER — MIRTAZAPINE 15 MG PO TABS
15.0000 mg | ORAL_TABLET | Freq: Every day | ORAL | Status: DC
  Administered 2015-07-04 – 2015-07-07 (×4): 15 mg via ORAL
  Filled 2015-07-04 (×7): qty 1

## 2015-07-04 MED ORDER — CARBIDOPA-LEVODOPA ER 25-100 MG PO TBCR
2.0000 | EXTENDED_RELEASE_TABLET | Freq: Every day | ORAL | Status: DC
  Administered 2015-07-05 – 2015-07-08 (×4): 2 via ORAL
  Filled 2015-07-04 (×6): qty 2

## 2015-07-04 MED ORDER — FERROUS FUMARATE 324 (106 FE) MG PO TABS: 1.0000 | ORAL_TABLET | Freq: Two times a day (BID) | ORAL | Status: DC

## 2015-07-04 MED ORDER — ONDANSETRON HCL 4 MG PO TABS: 4.0000 mg | ORAL_TABLET | Freq: Four times a day (QID) | ORAL | Status: DC | PRN

## 2015-07-04 MED ORDER — CETYLPYRIDINIUM CHLORIDE 0.05 % MT LIQD
7.0000 mL | Freq: Two times a day (BID) | OROMUCOSAL | Status: DC
  Administered 2015-07-05 – 2015-07-10 (×12): 7 mL via OROMUCOSAL

## 2015-07-04 MED ORDER — DOCUSATE SODIUM 100 MG PO CAPS
100.0000 mg | ORAL_CAPSULE | Freq: Two times a day (BID) | ORAL | Status: DC
  Administered 2015-07-04 – 2015-07-08 (×5): 100 mg via ORAL
  Filled 2015-07-04 (×6): qty 1

## 2015-07-04 MED ORDER — VENLAFAXINE HCL ER 150 MG PO CP24
150.0000 mg | ORAL_CAPSULE | Freq: Two times a day (BID) | ORAL | Status: DC
  Administered 2015-07-04 – 2015-07-08 (×8): 150 mg via ORAL
  Filled 2015-07-04 (×13): qty 1

## 2015-07-04 MED ORDER — VITAMIN D3 25 MCG (1000 UNIT) PO TABS
2000.0000 [IU] | ORAL_TABLET | Freq: Every day | ORAL | Status: DC
  Administered 2015-07-05 – 2015-07-08 (×4): 2000 [IU] via ORAL
  Filled 2015-07-04 (×5): qty 2

## 2015-07-04 MED ORDER — METRONIDAZOLE 250 MG PO TABS: 250.0000 mg | ORAL_TABLET | Freq: Three times a day (TID) | ORAL | Status: DC

## 2015-07-04 MED ORDER — ROPINIROLE HCL 1 MG PO TABS
2.0000 mg | ORAL_TABLET | Freq: Three times a day (TID) | ORAL | Status: DC
  Administered 2015-07-04 – 2015-07-08 (×12): 2 mg via ORAL
  Filled 2015-07-04 (×20): qty 2

## 2015-07-04 MED ORDER — METRONIDAZOLE 250 MG PO TABS
250.0000 mg | ORAL_TABLET | Freq: Three times a day (TID) | ORAL | Status: DC
  Administered 2015-07-04 – 2015-07-05 (×3): 250 mg via ORAL
  Filled 2015-07-04 (×5): qty 1

## 2015-07-04 MED ORDER — ACETAMINOPHEN 325 MG PO TABS
650.0000 mg | ORAL_TABLET | Freq: Four times a day (QID) | ORAL | Status: DC | PRN
  Administered 2015-07-04 – 2015-07-08 (×4): 650 mg via ORAL
  Filled 2015-07-04 (×4): qty 2

## 2015-07-04 MED ORDER — SENNA 8.6 MG PO TABS: 1.0000 | ORAL_TABLET | Freq: Every day | ORAL | Status: DC | PRN

## 2015-07-04 MED ORDER — CHOLECALCIFEROL 50 MCG (2000 UT) PO CAPS: 1.0000 | ORAL_CAPSULE | Freq: Every day | ORAL | Status: DC

## 2015-07-04 MED ORDER — ARIPIPRAZOLE 5 MG PO TABS
5.0000 mg | ORAL_TABLET | Freq: Two times a day (BID) | ORAL | Status: DC
  Administered 2015-07-04 – 2015-07-09 (×9): 5 mg via ORAL
  Filled 2015-07-04 (×13): qty 1

## 2015-07-04 MED ORDER — CEFTRIAXONE SODIUM IN DEXTROSE 20 MG/ML IV SOLN
1.0000 g | INTRAVENOUS | Status: DC
  Administered 2015-07-04 – 2015-07-06 (×3): 1 g via INTRAVENOUS
  Filled 2015-07-04 (×4): qty 50

## 2015-07-04 MED ORDER — CARBIDOPA-LEVODOPA-ENTACAPONE 50-200-200 MG PO TABS: 1.0000 | ORAL_TABLET | Freq: Every evening | ORAL | Status: DC

## 2015-07-04 MED ORDER — SODIUM CHLORIDE 0.9 % IV SOLN: INTRAVENOUS | Status: DC

## 2015-07-04 MED ORDER — ASPIRIN 81 MG PO CHEW
81.0000 mg | CHEWABLE_TABLET | Freq: Every day | ORAL | Status: DC
  Administered 2015-07-04 – 2015-07-08 (×5): 81 mg via ORAL
  Filled 2015-07-04 (×6): qty 1

## 2015-07-04 MED ORDER — CLINDAMYCIN HCL 300 MG PO CAPS
300.0000 mg | ORAL_CAPSULE | Freq: Two times a day (BID) | ORAL | Status: DC
  Administered 2015-07-04 – 2015-07-08 (×8): 300 mg via ORAL
  Filled 2015-07-04 (×12): qty 1

## 2015-07-04 NOTE — Care Management Important Message (Signed)
Important Message  Patient Details  Name: Sabrina Duncan MRN: [id number] Date of Birth: [birth year]   Medicare Important Message Given:  Yes-third notification given    Juliann Pulse A Allmond 07/04/2015, 11:35 AM

## 2015-07-04 NOTE — Progress Notes (Signed)
Mrs Nault found to be having seizure activity, became unresponsive, no hx of seizures Rapid response and MD made aware. Orders placed by MD. Nursing continues to monitor and treat as ordered.

## 2015-07-04 NOTE — Progress Notes (Signed)
[facility] at [facility] NAME: Steele Ledonne    MR#:  [id number]  DATE OF BIRTH:  [birth year]  SUBJECTIVE:  CHIEF COMPLAINT:  Seen today says that she had a clear night yesterday. However noted to have a jerking of both hands and possible seizure so she started on Keppra. Patient told me that for the past 2 weeks she is having to thejerking of the hands and the confusion  when that happens. Patient's is Dr. Royden Purl shower for a Parkinson disease symptoms.Did not notice any R stool coming out of vagina,Patient denies any complaints of stool coming out of the vagina. [place], yesterday. Hypotensive this morning.  REVIEW OF SYSTEMS:  Pt with ams today  DRUG ALLERGIES:   Allergies  Allergen Reactions  . Morphine And Related Other (See Comments)    Reaction:  Hallucinations   . Oxycodone Other (See Comments)    Reaction:  Unknown   . Sulfa Antibiotics Itching  . Tazobactam Other (See Comments)    Reaction:  Unknown   . Adhesive [Tape] Rash  . Isosorbide Rash    VITALS:  Blood pressure 82/52, pulse 86, temperature 98.5 F (36.9 C), temperature source Oral, resp. rate 15, height 5\' 6"  (1.676 m), weight 55.339 kg (122 lb), SpO2 97 %.  PHYSICAL EXAMINATION:  GENERAL:  74 y.o.-year-old patient lying in the bed with no acute distress.  EYES: Pupils equal, round, reactive to light and accommodation. No scleral icterus. Extraocular muscles intact.  HEENT: Head atraumatic, normocephalic. Oropharynx and nasopharynx clear.  NECK:  Supple, no jugular venous distention. No thyroid enlargement, no tenderness.  LUNGS: Normal breath sounds bilaterally, no wheezing, rales,rhonchi or crepitation. No use of accessory muscles of respiration.  CARDIOVASCULAR: S1, S2 normal. No murmurs, rubs, or gallops.  ABDOMEN: Soft, nontender, nondistended. Bowel sounds present. No organomegaly or mass.  EXTREMITIES: Right hip wound VAC present complaints of right  hip pain. Right lower extremity with wound VAC on the nonhealing wound NEUROLOGIC: Lethargic  PSYCHIATRIC: Lethargic SKIN: No obvious rash, lesion. Positive pressure ulcer   LABORATORY PANEL:   CBC  Recent Labs Lab 07/04/15 0453  WBC 3.1*  HGB 8.9*  HCT 26.3*  PLT 379   ------------------------------------------------------------------------------------------------------------------  Chemistries   Recent Labs Lab 07/04/15 0453  NA 136  K 3.8  CL 105  CO2 28  GLUCOSE 119*  BUN 15  CREATININE 0.61  CALCIUM 7.9*   ------------------------------------------------------------------------------------------------------------------  Cardiac Enzymes No results for input(s): TROPONINI in the last 168 hours. ------------------------------------------------------------------------------------------------------------------  RADIOLOGY:  Ct Head Wo Contrast  07/04/2015   CLINICAL DATA:  Initial evaluation for acute seizure.  EXAM: CT HEAD WITHOUT CONTRAST  TECHNIQUE: Contiguous axial images were obtained from the base of the skull through the vertex without intravenous contrast.  COMPARISON:  Prior study from 02/09/2015  FINDINGS: Diffuse prominence of the CSF containing spaces is compatible with generalized cerebral atrophy. Patchy and confluent hypodensity within the periventricular and deep white matter both cerebral hemispheres present, most consistent with chronic small vessel ischemic disease, stable from previous. Remote right cerebellar infarct noted, also stable.  No acute large vessel territory infarct. No mass lesion, midline shift, or mass effect. No extra-axial fluid collection.  Scalp soft tissues within normal limits.  No acute abnormality about the orbits.  Paranasal sinuses and mastoid air cells are clear.  Calvarium intact.  IMPRESSION: 1. No acute intracranial process identified. 2. Generalized cerebral atrophy with advanced chronic microvascular ischemic disease and  remote  right cerebellar infarct, stable.   Electronically Signed   By: Jeannine Boga M.D.   On: 07/04/2015 05:28   Dg Chest Port 1 View  07/02/2015   CLINICAL DATA:  Shortness of Breath  EXAM: PORTABLE CHEST - 1 VIEW  COMPARISON:  None.  FINDINGS: Patient is rotated. There is no edema or consolidation. Heart is mildly enlarged with pulmonary vascularity within normal limits. No adenopathy. There is atherosclerotic change in aorta. There is a total shoulder replacement on the right. There is evidence of an old healed fracture of the proximal left humeral metaphysis. There is also an old healed fracture of the lateral left clavicle. There are surgical clips in the left axillary region with evidence of prior left mastectomy.  IMPRESSION: No edema or consolidation.  Mild cardiac enlargement.   Electronically Signed   By: Lowella Grip III M.D.   On: 07/02/2015 13:33    EKG:   Orders placed or performed during the hospital encounter of 06/28/15  . EKG 12-Lead  . EKG 12-Lead    ASSESSMENT AND PLAN:     # Delayed surgical wound healing - this is largely due to the development of a hematoma. Patient initially had a hip replacement surgery  and have revision surgery for removal of the same followed by complications with delayed wound healing. 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 Had a revision surgery of total hip replacement in May. Sees Dr. Ola Spurr for that. She is on lifelong the [place], rifampin. Will reconsult Dr. Ola Spurr secondary to high-grade fever, hip pain.wound cultures showed provotella  brivia. Sepsis  With fever/hypotension;follow blood cultures,urine cultures,start iv fluids. #  Anemia - she is artery requiring several transfusions, she has continued drainage and bleeding from this wound site her hemoglobin tends to drop. It is down to 7.3 --> 9.3--> 9.2, positive FOBT. Consulted, GI patient likely had hemorrhoidal bleeding. Not a candidate  for luminal  secondary to multiple medical problems. # Coronary atherosclerosis - continue home medications  # Essential hypertension - hypotensive today so hold the metoprolol.  #HLD (hyperlipidemia) - stable, continue home meds for this   #Parkinson's disease/ Cobalt metallosis  - sees Dr. Jennings Books For that she is on now carbidopa, levodopa  . Saw him on June 15. Possible R myoclonic jerks started on keppra  last night. CT head is unremarkable. The neurology consult secondary to possible myoclonic S jerks/seizures.    # Pressure ulcer-reposition patient every 2-3 hours, wound care consult   #Generalized anxiety disorder - stable, continue home medications   # Hallucinations- improved after starting the Seroquel back and also increase the Remeron. Appreciate psychiatric following the patient.   #Acute urinary retention- inserted  foley;  All the records are reviewed and case discussed with Care Management/Social Workerr. Management plans discussed with the patient, family and they are in agreement.  CODE STATUS: FULL  TOTAL TIME TAKING CARE OF THIS PATIENT: 35 minutes.   POSSIBLE D/C IN 2-3 DAYS, DEPENDING ON CLINICAL CONDITION.   Epifanio Lesches M.D on 07/04/2015 at 9:31 AM  Between 7am to 6pm - Pager - [phone] After 6pm go to [email or web address] - password [email or web address] [facility]  Office  [phone]  CC: Primary care physician; Idelle Crouch, MD

## 2015-07-04 NOTE — H&P (Signed)
[facility]          History and Physical    PCP:   SPARKS,JEFFREY D, MD   Transfer from [place]  Chief Complaint:  "Needs ID consultation"  HPI: Patient is a 74 year old woman with multiple medical comorbidities who was transferred from [facility] hospital today as it was thought patient required an ID consultation and this was not readily available at [facility] today. Patient was admitted to [facility] on 7/7 for acute blood loss anemia resulting from a right hip hematoma. 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 Ola Spurr, infectious disease. Has a wound VAC to the right hip as well. The day prior to transfer she developed a temperature of 102.4 in the context of an apparent UTI and had a possible seizure versus myoclonic jerking. I do not see where this urine infection was treated. We received request for transfer today for a potential ID consultation as patient continues to spike temperatures despite long-term antibiotics for her right hip hardware infection. Patient today is mostly concerned about her hallucinations and how bothersome they are.  Allergies:   Allergies  Allergen Reactions  . Morphine And Related Other (See Comments)    Reaction:  Hallucinations   . Oxycodone Other (See Comments)    Reaction:  Unknown   . Sulfa Antibiotics Itching  . Tazobactam Other (See Comments)    Reaction:  Unknown   . Adhesive [Tape] Rash  . Isosorbide Rash      Past Medical History  Diagnosis Date  . Depressive disorder, not elsewhere classified   . Unspecified osteomyelitis, site unspecified   . Polymyalgia rheumatica   . Paralysis agitans   . Osteoarthrosis, unspecified whether generalized or localized, unspecified site   . Muscle weakness (generalized)   . Difficulty in walking(719.7)   . Parkinson disease   . PMR (polymyalgia  rheumatica)   . Coronary atherosclerosis of unspecified type of vessel, native or graft     2007: LAD PCI with a BMS, 80% distal LCX stenosis treated medically. Most recent stress test in 04/2012 showed distal anterio/apical scar? with no ischemia, EF 53%.   [name] Unspecified essential hypertension   . Other and unspecified hyperlipidemia   . Thoracic ascending aortic aneurysm     4.0 cm  . Complication of anesthesia     2013- hallucinations- [facility] (-spinal anesth. 2013- no problems   redo- hip- L)  . Esophageal reflux     pt. reports that its resolved   . Neuromuscular disorder     parkinson's - [facility] - neurologist   . Cancer     left breast cancer- lumpectomy, chemo, radiation, tamoxifen , SLNBx  . Anemia, unspecified     bld. transfusion- 02/2013    Past Surgical History  Procedure Laterality Date  . Knee surgery Left   . Varicose vein surgery      both legs   . Melanoma excision Right     elbow  . Knee surgery Left   . Hip replacement Bilateral   . Foot surgery Right 2013  . Rotator cuff repair Right   . Cardiac catheterization  2007    [place], [place]; x1 stent  . Cardiac catheterization  9/14    [facility]  . Breast lumpectomy Left 1997  . Joint replacement  L-hipx5, R hipx2, Lkneex1,   . Tonsillectomy    . Cholecystectomy  2005    open repair - for gangernous   . Peripherally inserted central catheter insertion      for IV antibiotics related to repeated infection, post hip replacement  . Reverse shoulder arthroplasty Right 09/29/2013    Procedure: REVERSE SHOULDER ARTHROPLASTY;  Surgeon: Nita Sells, MD;  Location: [facility];  Service: Orthopedics;  Laterality: Right;  Right reverse total shoulder  . Hematoma evacuation Right 05/11/2015    Procedure: EVACUATION HEMATOMA/RIGHT HIP HEMATOMA DRAINAGE WITH WOUND VAC;  Surgeon: Hessie Knows, MD;  Location: [facility];  Service: Orthopedics;  Laterality: Right;  . Incision and drainage hip  Right 06/19/2015    Procedure: IRRIGATION AND DEBRIDEMENT HIP;  Surgeon: Hessie Knows, MD;  Location: [facility];  Service: Orthopedics;  Laterality: Right;  hemovac placement    Prior to Admission medications   Medication Sig Start Date End Date Taking? Authorizing Provider  acetaminophen (TYLENOL) 325 MG tablet Take 2 tablets (650 mg total) by mouth every 6 (six) hours as needed for mild pain (or Fever >/= 101). 05/14/15   Aldean Jewett, MD  ARIPiprazole (ABILIFY) 5 MG tablet Take 1 tablet (5 mg total) by mouth daily. Patient taking differently: Take 5 mg by mouth 2 (two) times daily.  05/14/15   Aldean Jewett, MD  aspirin 81 MG chewable tablet Chew 81 mg by mouth daily.    Historical Provider, MD  atorvastatin (LIPITOR) 20 MG tablet Take 20 mg by mouth at bedtime.     Historical Provider, MD  carbidopa-levodopa (SINEMET CR) 50-200 MG per tablet Take 2 tablets by mouth 4 (four) times daily.    Historical Provider, MD  carbidopa-levodopa-entacapone (STALEVO) 50-200-200 MG per tablet Take 1 tablet by mouth every evening.     Historical Provider, MD  Cholecalciferol (RA VITAMIN D-3) 2000 UNITS CAPS Take 1 capsule by mouth daily.    Historical Provider, MD  clindamycin (CLEOCIN) 300 MG capsule Take 300 mg by mouth 2 (two) times daily.    Historical Provider, MD  collagenase (SANTYL) ointment Apply topically daily. Patient not taking: Reported on 06/27/2015 06/24/15   Aldean Jewett, MD  docusate sodium 100 MG CAPS Take 100 mg by mouth 2 (two) times daily. 10/03/13   Tania Ade, MD  Ferrous Fumarate (FERROCITE) 324 (106 FE) MG TABS Take 1 tablet by mouth 2 (two) times daily.    Historical Provider, MD  ferrous fumarate (HEMOCYTE - 106 MG FE) 325 (106 FE) MG TABS tablet Take 1 tablet (106 mg of iron total) by mouth 2 (two) times daily. Patient not taking: Reported on 06/13/2015 05/28/15   Hillary Bow, MD  metoprolol (LOPRESSOR) 50 MG tablet Take 50 mg by mouth 2 (two) times daily.     Historical Provider, MD  metroNIDAZOLE (FLAGYL) 250 MG tablet Take 1 tablet (250 mg total) by mouth every 8 (eight) hours. 06/24/15 07/04/15  Aldean Jewett, MD  rifampin (RIFADIN) 300 MG capsule Take 300 mg by mouth 2 (two) times daily.    Historical Provider, MD  rOPINIRole (REQUIP) 2 MG tablet Take 2 mg by mouth 3 (three) times daily.    Historical Provider, MD  senna (SENOKOT) 8.6 MG TABS tablet Take 1 tablet (8.6 mg total) by mouth daily as needed for mild constipation. Patient not taking: Reported on 06/27/2015 05/14/15   Aldean Jewett, MD  traMADol (ULTRAM) 50 MG tablet Take 1 tablet (50 mg total) by  mouth every 6 (six) hours as needed for moderate pain. Patient taking differently: Take 50 mg by mouth 3 (three) times daily as needed for moderate pain.  05/14/15   Duanne Guess, PA-C  venlafaxine XR (EFFEXOR-XR) 150 MG 24 hr capsule Take 150 mg by mouth 2 (two) times daily.     Historical Provider, MD  vitamin B-12 (CYANOCOBALAMIN) 1000 MCG tablet Take 1,000 mcg by mouth daily.     Historical Provider, MD    Social History:  reports that she quit smoking about 30 years ago. Her smoking use included Cigarettes. She has a 5 pack-year smoking history. She does not have any smokeless tobacco history on file. She reports that she drinks alcohol. She reports that she does not use illicit drugs.  Family History  Problem Relation Age of Onset  . Hypertension Mother     Review of Systems:  Constitutional: Denies chills, diaphoresis, appetite change and fatigue.  HEENT: Denies photophobia, eye pain, redness, hearing loss, ear pain, congestion, sore throat, rhinorrhea, sneezing, mouth sores, trouble swallowing, neck pain, neck stiffness and tinnitus.   Respiratory: Denies SOB, DOE, cough, chest tightness,  and wheezing.   Cardiovascular: Denies chest pain, palpitations and leg swelling.  Gastrointestinal: Denies nausea, vomiting, abdominal pain, diarrhea, constipation, blood in stool and  abdominal distention.  Genitourinary: Denies dysuria, urgency, frequency, hematuria, flank pain and difficulty urinating.  Endocrine: Denies: hot or cold intolerance, sweats, changes in hair or nails, polyuria, polydipsia. Musculoskeletal: Denies myalgias, back pain, joint swelling, arthralgias and gait problem.  Skin: Denies pallor, rash and wound.  Neurological: Denies dizziness, seizures, syncope, weakness, light-headedness, numbness and headaches.  Hematological: Denies adenopathy. Easy bruising, personal or family bleeding history  Psychiatric/Behavioral: Denies suicidal ideation, mood changes, confusion, nervousness, sleep disturbance and agitation   Physical Exam: Blood pressure 98/49, pulse 92, temperature 98.2 F (36.8 C), temperature source Oral, resp. rate 16, SpO2 97 %. General: Frail, elderly lady lying in bed in no distress, alert, awake, oriented 3 HEENT: Normocephalic, atraumatic, pupils equal round and reactive to light, tracker movements intact Neck: Supple, no JVD, no lymphadenopathy, no bruits, no goiter. Cardiovascular: Regular rate and rhythm, no murmurs, rubs or gallops Lungs: Clear to auscultation bilaterally Abdomen: Soft, nontender, nondistended, positive bowel sounds, no mass or organomegaly noted Extremities: Contractures, no clubbing, cyanosis or edema, wound VAC to right hip, stage II decubitus ulcer to her sacrum Neurologic: Nonfocal, moves all 4 spontaneously   Labs on Admission:  Results for orders placed or performed during the hospital encounter of 06/28/15 (from the past 48 hour(s))  Urine culture     Status: None (Preliminary result)   Collection Time: 07/02/15 10:41 PM  Result Value Ref Range   Specimen Description URINE, CLEAN CATCH    Special Requests clindamycin, metronidazole, rifampin    Culture      >=100,000 COLONIES/mL GRAM NEGATIVE RODS >=100,000 COLONIES/mL GRAM POSITIVE COCCI IDENTIFICATION AND SUSCEPTIBILITIES TO FOLLOW    Report  Status PENDING   Basic metabolic panel     Status: Abnormal   Collection Time: 07/03/15  5:37 AM  Result Value Ref Range   Sodium 140 135 - 145 mmol/L   Potassium 4.4 3.5 - 5.1 mmol/L   Chloride 107 101 - 111 mmol/L   CO2 26 22 - 32 mmol/L   Glucose, Bld 109 (H) 65 - 99 mg/dL   BUN 18 6 - 20 mg/dL   Creatinine, Ser 0.62 0.44 - 1.00 mg/dL   Calcium 8.1 (L) 8.9 - 10.3  mg/dL   GFR calc non Af Amer >60 >60 mL/min   GFR calc Af Amer >60 >60 mL/min    Comment: (NOTE) The eGFR has been calculated using the CKD EPI equation. This calculation has not been validated in all clinical situations. eGFR's persistently <60 mL/min signify possible Chronic Kidney Disease.    Anion gap 7 5 - 15  CBC     Status: Abnormal   Collection Time: 07/03/15  7:43 AM  Result Value Ref Range   WBC 6.0 3.6 - 11.0 K/uL   RBC 3.21 (L) 3.80 - 5.20 MIL/uL   Hemoglobin 9.2 (L) 12.0 - 16.0 g/dL   HCT 28.4 (L) 35.0 - 47.0 %   MCV 88.3 80.0 - 100.0 fL   MCH 28.8 26.0 - 34.0 pg   MCHC 32.6 32.0 - 36.0 g/dL   RDW 20.0 (H) 11.5 - 14.5 %   Platelets 431 150 - 440 K/uL  Glucose, capillary     Status: Abnormal   Collection Time: 07/04/15  4:01 AM  Result Value Ref Range   Glucose-Capillary 121 (H) 65 - 99 mg/dL  CBC     Status: Abnormal   Collection Time: 07/04/15  4:53 AM  Result Value Ref Range   WBC 3.1 (L) 3.6 - 11.0 K/uL   RBC 2.96 (L) 3.80 - 5.20 MIL/uL   Hemoglobin 8.9 (L) 12.0 - 16.0 g/dL   HCT 26.3 (L) 35.0 - 47.0 %   MCV 89.1 80.0 - 100.0 fL   MCH 30.1 26.0 - 34.0 pg   MCHC 33.8 32.0 - 36.0 g/dL   RDW 20.6 (H) 11.5 - 14.5 %   Platelets 379 150 - 440 K/uL  Basic metabolic panel     Status: Abnormal   Collection Time: 07/04/15  4:53 AM  Result Value Ref Range   Sodium 136 135 - 145 mmol/L   Potassium 3.8 3.5 - 5.1 mmol/L   Chloride 105 101 - 111 mmol/L   CO2 28 22 - 32 mmol/L   Glucose, Bld 119 (H) 65 - 99 mg/dL   BUN 15 6 - 20 mg/dL   Creatinine, Ser 0.61 0.44 - 1.00 mg/dL   Calcium 7.9 (L) 8.9  - 10.3 mg/dL   GFR calc non Af Amer >60 >60 mL/min   GFR calc Af Amer >60 >60 mL/min    Comment: (NOTE) The eGFR has been calculated using the CKD EPI equation. This calculation has not been validated in all clinical situations. eGFR's persistently <60 mL/min signify possible Chronic Kidney Disease.    Anion gap 3 (L) 5 - 15  Sedimentation rate     Status: Abnormal   Collection Time: 07/04/15 10:32 AM  Result Value Ref Range   Sed Rate 128 (H) 0 - 30 mm/hr  Lactic acid, plasma     Status: None   Collection Time: 07/04/15  1:16 PM  Result Value Ref Range   Lactic Acid, Venous 1.4 0.5 - 2.0 mmol/L    Radiological Exams on Admission: Ct Head Wo Contrast  07/04/2015   CLINICAL DATA:  Initial evaluation for acute seizure.  EXAM: CT HEAD WITHOUT CONTRAST  TECHNIQUE: Contiguous axial images were obtained from the base of the skull through the vertex without intravenous contrast.  COMPARISON:  Prior study from 02/09/2015  FINDINGS: Diffuse prominence of the CSF containing spaces is compatible with generalized cerebral atrophy. Patchy and confluent hypodensity within the periventricular and deep white matter both cerebral hemispheres present, most consistent with chronic small vessel ischemic  disease, stable from previous. Remote right cerebellar infarct noted, also stable.  No acute large vessel territory infarct. No mass lesion, midline shift, or mass effect. No extra-axial fluid collection.  Scalp soft tissues within normal limits.  No acute abnormality about the orbits.  Paranasal sinuses and mastoid air cells are clear.  Calvarium intact.  IMPRESSION: 1. No acute intracranial process identified. 2. 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   Electronically Signed   By: Jeannine Boga M.D.   On: 07/04/2015 05:28    Assessment/Plan Active Problems:   Essential hypertension   Acute blood loss anemia    Hematoma   Protein-calorie malnutrition, severe   Delayed surgical wound healing   Severe recurrent major depression with psychotic features   UTI (urinary tract infection)   Fever   Seizures   UTI (lower urinary tract infection)    Fever/SIRS -I believe this is most likely related to her urinary tract infection. -Has had no temperature since 8 PM yesterday evening of 102.4. -Lactic acid normal at 1.4. -See below for details.  UTI -Culture from 7/11 with greater than 100,000 colonies of gram-negative rods and gram-positive cocci with identification and susceptibilities pending. -Start Rocephin pending culture data.  Acute blood loss anemia -Secondary to complications from right hip replacement. -Hemoglobin stable at 8.9, no indication for transfusion at present.  Right hip wound -Has had a right hip hematoma and delayed surgical healing from initial right hip replacement. -Wound VAC currently in place, will request wound care consultation. -Hassan placed on lifelong antibiotics with clindamycin, rifampin, Flagyl as per infectious diseases. We'll continue this hospitalization.  Multiple decubiti ulcers -Wound care consultation. -air overlay mattress.  Parkinson's disease -Continue Sinemet.  Questionable seizure activity -This occurred night prior to transfer. She was started on Keppra. -I believe this was likely related to myoclonic jerking in presence of high temperatures. -Do not believe she requires neurology consultation or long-term treatment with Keppra unless she were to have repeated seizure activity.  Severe protein caloric malnutrition -We'll request dietitian consultation.  Major depression with psychotic features -Was seen by psychiatry at [facility] and it was decided to increase her Remeron to 15 mg at night as well as increasing Seroquel to 150 at nighttime. -We'll continue these medications in the hospital.  DVT prophylaxis -SCDs.  CODE STATUS -Full  code as discussed with patient.      Time Spent on Admission: 110 minutes  [facility] Pager: [phone] 07/04/2015, 6:13 PM

## 2015-07-04 NOTE — Consult Note (Signed)
WOC wound follow up Wound type:Right hip surgical wound dehiscence. VAC application.  Measurement: 8 cm x 2 cm x 6 cm (dehisched surgical site with sutures in place) Wound bed: 100% pale pink nongranulating.  Hardware directly palpable.  Drainage (amount, consistency, odor) Moderate blood tinged drainage.  Periwound:Intact.  Suture line protected with silicone contact layer.  Dressing procedure/placement/frequency:Cleanse wound with NS and pat gently dry.  Gently fill wound bed with black foam. Place foam along suture line. Cover with VAC drape.  Change Mon-Wed-Fri.  Bedside nurse may perform.  Please page wound care if needed to assist.   Will not follow at this time.  Please re-consult if needed.  Domenic Moras RN BSN [place] Pager [phone]

## 2015-07-04 NOTE — Progress Notes (Signed)
Gave update to family concerning patient. During this conversation the family expressed concerns about patient nutritional status and medication regimen. Will continue to monitor.

## 2015-07-04 NOTE — Progress Notes (Signed)
Patient arrived to [facility] from [facility]. Vitals stable. No distress noted. Upon arrival to floor pt noted to have 3 stage II ulcers on sacrum, stage 4 on left elbow, and stage 1 on right heel. Pt placed low air loss rotating bed. All wounds cleaned and redressed.  Family contacted and updated.

## 2015-07-04 NOTE — Discharge Summary (Signed)
Sabrina Duncan, is a 74 y.o. female  DOB [birth year]  MRN [id number].  Admission date:  06/28/2015  Admitting Physician  Lance Coon, MD  Discharge Date:  07/04/2015   Primary MD  SPARKS,JEFFREY D, MD  Recommendations for primary care physician for things to follow:   Patient is being transferred to [facility] for further continued care   Admission Diagnosis  Acute blood loss anemia [D62] Nonhealing surgical wound, subsequent encounter [T81.89XD]   Discharge Diagnosis  Acute blood loss anemia [D62] Nonhealing surgical wound, subsequent encounter [T81.89XD]    Principal Problem:   Delayed surgical wound healing Active Problems:   Coronary atherosclerosis   Essential hypertension   HLD (hyperlipidemia)   Parkinson's disease   Generalized anxiety disorder   Anemia   Subacute delirium   Severe recurrent major depression with psychotic features      Past Medical History  Diagnosis Date  . Depressive disorder, not elsewhere classified   . Unspecified osteomyelitis, site unspecified   . Polymyalgia rheumatica   . Paralysis agitans   . Osteoarthrosis, unspecified whether generalized or localized, unspecified site   . Muscle weakness (generalized)   . Difficulty in walking(719.7)   . Parkinson disease   . PMR (polymyalgia rheumatica)   . Coronary atherosclerosis of unspecified type of vessel, native or graft     2007: LAD PCI with a BMS, 80% distal LCX stenosis treated medically. Most recent stress test in 04/2012 showed distal anterio/apical scar? with no ischemia, EF 53%.   [name] Unspecified essential hypertension   . Other and unspecified hyperlipidemia   . Thoracic ascending aortic aneurysm     4.0 cm  . Complication of anesthesia     2013- hallucinations- [facility].  (-spinal anesth. 2013- no problems   redo- hip- L)  . Esophageal reflux     pt. reports that its resolved   . Neuromuscular disorder     parkinson's - [facility] - neurologist   . Cancer     left breast cancer- lumpectomy, chemo, radiation, tamoxifen , SLNBx  . Anemia, unspecified     bld. transfusion- 02/2013    Past Surgical History  Procedure Laterality Date  . Knee surgery Left   . Varicose vein surgery      both legs   . Melanoma excision Right     elbow  . Knee surgery Left   . Hip replacement Bilateral   . Foot surgery Right 2013  . Rotator cuff repair Right   . Cardiac catheterization  2007    [place], [place]; x1 stent  . Cardiac catheterization  9/14    [facility]  . Breast lumpectomy Left 1997  . Joint replacement      L-hipx5, R hipx2, Lkneex1,   . Tonsillectomy    . Cholecystectomy  2005    open repair - for gangernous   . Peripherally inserted central catheter insertion      for IV antibiotics related to repeated infection, post hip replacement  . Reverse shoulder arthroplasty Right 09/29/2013    Procedure: REVERSE SHOULDER ARTHROPLASTY;  Surgeon: Nita Sells, MD;  Location: [place];  Service: Orthopedics;  Laterality: Right;  Right reverse total shoulder  . Hematoma evacuation Right 05/11/2015    Procedure: EVACUATION HEMATOMA/RIGHT HIP HEMATOMA DRAINAGE WITH WOUND VAC;  Surgeon: Hessie Knows, MD;  Location: [facility];  Service: Orthopedics;  Laterality: Right;  . Incision and drainage hip Right 06/19/2015    Procedure: IRRIGATION AND DEBRIDEMENT  HIP;  Surgeon: Hessie Knows, MD;  Location: [facility];  Service: Orthopedics;  Laterality: Right;  hemovac placement       History of present illness and  Hospital Course:     Kindly see H&P for history of present illness and admission details, please review complete Labs, Consult reports and Test reports for all details in brief  HPI  from the history and physical done on the day of admission 74 year old female  patient admitted for anemia secondary to blood loss from the right hip delayed wound healing. Received 1 unit of transfusion. Patient previous admission significant for right hematoma, admitted on June 28 discharge on July 3 2 [place] breech. That hospitalization significant for right femur right hip hematoma and she had operative drainage and wound VAC placement Dr. Sherrill Raring, wound [facility] was discontinued from right hip on July 1 and patient remained stable so discharged back to [facility] on July 3. Comes back on July 7 secondary to persistent serosanguineous drainage, anemia. Sharon hemoglobin dropped from 8.3-7.3 in 4 days.   Hospital Course ; Acute on chronic  Anemia; secondary to continue drainage and bleeding from the wound site on the right hip. Patient received transfusion., Seen by Dr. Rudene Christians. Hemoglobin stable at 8.9 today. #2 delayed with surgical wound healing. Patient had right hip revisions and hematoma drainage and that's wound VAC placement. Present wound VAC is placed on admission on July 7 by Dr. Rudene Christians. Patient continued to have fevers, hypotension. He also has right hip pain. Patient needs infectious disease consult was do not have at [facility], Dr. Ola Spurr is on vacation so patient needed to be transferred to [facility] for continued care. Patient is now on lifelong the antibiotics with clinda,rifampin.  #3. Sepsis with hypotension and fever continue aggressive hydration, check lactic acid, blood cultures urine cultures and wound cultures are repeated. Continue IV fluids. I'll update [facility] physician with lactic acid levels.  #4 Parkinson disease patient follows up with Dr. Franciso [place] shock continue levodopa and carbidopa.  #Myoclonic jerks patient had seizure-like activity last night started on Keppra, CT head was unremarkable. Patient needs nerology workup .Protein calorie malnutrition patient may benefit from feeding tube. Coronary: Started  yesterday.  Pressure ulcer patient is a special bed gel mattress and reposition the patient every 2-3 hours.  Generalized anxiety, hallucinations secondary to acute illness, underlying Parkinson disease. Continue Seroquel, Remeron. Seen by psychiatric doctor.     Discharge Condition: Stable   Follow UP  Follow-up Information    Follow up with MENZ,MICHAEL, MD In 1 week.   Specialty:  Orthopedic Surgery   Contact information:   [place]  [address] [phone]         Discharge Instructions  and  Discharge Medications        Medication List    ASK your doctor about these medications        acetaminophen 325 MG tablet  Commonly known as:  TYLENOL  Take 2 tablets (650 mg total) by mouth every 6 (six) hours as needed for mild pain (or Fever >/= 101).     ARIPiprazole 5 MG tablet  Commonly known as:  ABILIFY  Take 1 tablet (5 mg total) by mouth daily.     aspirin 81 MG chewable tablet  Chew 81 mg by mouth daily.     atorvastatin 20 MG tablet  Commonly known as:  LIPITOR  Take 20 mg by mouth at bedtime.  carbidopa-levodopa 50-200 MG per tablet  Commonly known as:  SINEMET CR  Take 2 tablets by mouth 4 (four) times daily.     carbidopa-levodopa-entacapone 50-200-200 MG per tablet  Commonly known as:  STALEVO  Take 1 tablet by mouth every evening.     clindamycin 300 MG capsule  Commonly known as:  CLEOCIN  Take 300 mg by mouth 2 (two) times daily.     collagenase ointment  Commonly known as:  SANTYL  Apply topically daily.     DSS 100 MG Caps  Take 100 mg by mouth 2 (two) times daily.     FERROCITE 324 (106 FE) MG Tabs  Generic drug:  Ferrous Fumarate  Take 1 tablet by mouth 2 (two) times daily.     ferrous fumarate 325 (106 FE) MG Tabs tablet  Commonly known as:  HEMOCYTE - 106 mg FE  Take 1 tablet (106 mg of iron total) by mouth 2 (two) times daily.     metoprolol 50 MG tablet  Commonly known as:  LOPRESSOR  Take 50  mg by mouth 2 (two) times daily.     metroNIDAZOLE 250 MG tablet  Commonly known as:  FLAGYL  Take 1 tablet (250 mg total) by mouth every 8 (eight) hours.     RA VITAMIN D-3 2000 UNITS Caps  Generic drug:  Cholecalciferol  Take 1 capsule by mouth daily.     rifampin 300 MG capsule  Commonly known as:  RIFADIN  Take 300 mg by mouth 2 (two) times daily.     rOPINIRole 2 MG tablet  Commonly known as:  REQUIP  Take 2 mg by mouth 3 (three) times daily.     senna 8.6 MG Tabs tablet  Commonly known as:  SENOKOT  Take 1 tablet (8.6 mg total) by mouth daily as needed for mild constipation.     traMADol 50 MG tablet  Commonly known as:  ULTRAM  Take 1 tablet (50 mg total) by mouth every 6 (six) hours as needed for moderate pain.     venlafaxine XR 150 MG 24 hr capsule  Commonly known as:  EFFEXOR-XR  Take 150 mg by mouth 2 (two) times daily.     vitamin B-12 1000 MCG tablet  Commonly known as:  CYANOCOBALAMIN  Take 1,000 mcg by mouth daily.          Diet and Activity recommendation: See Discharge Instructions above   Consults obtained -on low consult, and psychiatric consult, GI consult.  Major procedures and Radiology Reports - PLEASE review detailed and final reports for all details, in brief -       Ct Head Wo Contrast  07/04/2015   CLINICAL DATA:  Initial evaluation for acute seizure.  EXAM: CT HEAD WITHOUT CONTRAST  TECHNIQUE: Contiguous axial images were obtained from the base of the skull through the vertex without intravenous contrast.  COMPARISON:  Prior study from 02/09/2015  FINDINGS: Diffuse prominence of the CSF containing spaces is compatible with generalized cerebral atrophy. Patchy and confluent hypodensity within the periventricular and deep white matter both cerebral hemispheres present, most consistent with chronic small vessel ischemic disease, stable from previous. Remote right cerebellar infarct noted, also stable.  No acute large vessel territory  infarct. No mass lesion, midline shift, or mass effect. No extra-axial fluid collection.  Scalp soft tissues within normal limits.  No acute abnormality about the orbits.  Paranasal sinuses and mastoid air cells are clear.  Calvarium intact.  IMPRESSION: 1. No acute  intracranial process identified. 2. 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   Electronically Signed   By: Jeannine Boga M.D.   On: 07/04/2015 05:28   Mr Hip Right Wo Contrast  06/27/2015   CLINICAL DATA:  Total right hip replacement with persistent bleeding from the wound, anemia, and hypotension with transfusions.  EXAM: MR OF THE RIGHT HIP WITHOUT CONTRAST  TECHNIQUE: Multiplanar, multisequence MR imaging was performed. No intravenous contrast was administered. Metal artifact reduction protocol sequencing was performed.  COMPARISON:  Multiple exams, including 05/09/2015  FINDINGS: Greater than normal metal artifact along the right hip due to the lateral cage, cerclage wires, and hip prosthesis. This results in more artifact than is often encountered.  We do observe a a 13.1 by approximately 4.5 by 7.6 cm fluid collection along the posterolateral trochanteric margin of the right hip prosthesis appearing to extend towards the skin along the wound. This has high inversion recovery and low T1 signal characteristics and may reflect blood products. Infection/abscess not readily excluded.  Proximal hamstring tendons intact. The degree of artifact precludes assessment of the bone of the right hip. There is deformity of the right inferior pubic ramus from an old fracture as well as indistinctness of the quadrilateral plate from known prior acetabular injury.  The uterus, urinary bladder, and visualized bowel appear unremarkable.  Prominent atrophy of the right gluteal musculature.  IMPRESSION: 1. Persistent fluid collection lateral to the projected trochanteric  region of the right hip. This probably drains to the wound based on continuity with the scan. The lateral cage, cerclage, and total hip prosthesis generate more than the normal amount of susceptibility artifact resulting in a considerable degree of metal artifact obscuration of surrounding structures despite the use of metal artifact reduction sequences. 2. Prominent atrophy of the right gluteal musculature. 3. Deformity of the right inferior pubic ramus with known deformity of the right acetabulum. Bony structures could be better assessed by CT, if clinically warranted.   Electronically Signed   By: Van Clines M.D.   On: 06/27/2015 17:38   Dg Chest Port 1 View  07/02/2015   CLINICAL DATA:  Shortness of Breath  EXAM: PORTABLE CHEST - 1 VIEW  COMPARISON:  None.  FINDINGS: Patient is rotated. There is no edema or consolidation. Heart is mildly enlarged with pulmonary vascularity within normal limits. No adenopathy. There is atherosclerotic change in aorta. There is a total shoulder replacement on the right. There is evidence of an old healed fracture of the proximal left humeral metaphysis. There is also an old healed fracture of the lateral left clavicle. There are surgical clips in the left axillary region with evidence of prior left mastectomy.  IMPRESSION: No edema or consolidation.  Mild cardiac enlargement.   Electronically Signed   By: Lowella Grip III M.D.   On: 07/02/2015 13:33   Dg Hip Unilat With Pelvis 2-3 Views Right  06/27/2015   CLINICAL DATA:  RIGHT hip surgery in April and June, drainage from RIGHT hip wound  EXAM: RIGHT HIP (WITH PELVIS) 2-3 VIEWS  COMPARISON:  06/19/2015  FINDINGS: Diffuse osseous demineralization.  BILATERAL hip prostheses.  Deformity of proximal LEFT femur and greater trochanter.  Multiple cerclage wires and lateral plate at proximal RIGHT femur.  Several displaced bone fragments are noted cranial to the RIGHT greater trochanter.  Questionable osseous lucency  involving the greater trochanter and at the proximal margin of the medial femur at the intertrochanteric region.  Unable to exclude osteomyelitis with this appearance.  Old fracture deformities of the pubic rami noted.  No acute pelvic abnormalities seen.  IMPRESSION: BILATERAL hip prostheses.  Questionable bone destruction/lucency involving the proximal RIGHT femur versus resorption secondary to recent surgery; unable to exclude osteomyelitis with this appearance.  Significant osseous demineralization with old healed pubic rami fractures.   Electronically Signed   By: Lavonia Dana M.D.   On: 06/27/2015 13:01   Dg Hip Unilat With Pelvis 2-3 Views Right  06/19/2015   CLINICAL DATA:  Right hip pain.  EXAM: RIGHT HIP (WITH PELVIS) 2-3 VIEWS  COMPARISON:  Radiographs dated 05/12/2015 and CT scan dated 05/09/2015  FINDINGS: There is dystrophic calcification in the soft tissues at the site of the wall fracture of the medial wall of the right acetabulum. There is minimal displacement at that site. Small displaced bone fragments are seen in the soft tissues above the right hip but these appear less prominent than on the prior study. Right hip hardware appears unchanged. Old deformities of the pubic rami bilaterally.  No appreciable significant change in the appearance of the right hip since the radiographs of 05/12/2015.  IMPRESSION: Essentially stable appearance of the right hip since 05/12/2015.   Electronically Signed   By: Lorriane Shire M.D.   On: 06/19/2015 18:56    Micro Results     Recent Results (from the past 240 hour(s))  Urine culture     Status: None (Preliminary result)   Collection Time: 07/02/15 10:41 PM  Result Value Ref Range Status   Specimen Description URINE, CLEAN CATCH  Final   Special Requests clindamycin, metronidazole, rifampin  Final   Culture   Final    >=100,000 COLONIES/mL GRAM NEGATIVE RODS >=100,000 COLONIES/mL UNIDENTIFIED ORGANISM IDENTIFICATION AND SUSCEPTIBILITIES TO  FOLLOW FOR ORGANISM 1 IDENTIFICATION TO FOLLOW FOR ORGANISM 2    Report Status PENDING  Incomplete       Today   Subjective:   Sabrina Duncan today has fever,hypotension. Objective:   Blood pressure 82/52, pulse 86, temperature 98.5 F (36.9 C), temperature source Oral, resp. rate 15, height 5\' 6"  (1.676 m), weight 55.339 kg (122 lb), SpO2 97 %.   Intake/Output Summary (Last 24 hours) at 07/04/15 1309 Last data filed at 07/04/15 0900  Gross per 24 hour  Intake    210 ml  Output    725 ml  Net   -515 ml    Exam Awake Alert, Oriented x 3,   cachectic female No new F.N deficits, Normal affect [place].AT,PERRAL Supple Neck,No JVD, No cervical lymphadenopathy appriciated.  Symmetrical Chest wall movement, Good air movement bilaterally, CTAB RRR,No Gallops,Rubs or new Murmurs, No Parasternal Heave +ve B.Sounds, Abd Soft, Non tender, No organomegaly appriciated, No rebound -guarding or rigidity. No Cyanosis, Clubbing or edema, No new Rash or bruise  Data Review   CBC w Diff: Lab Results  Component Value Date   WBC 3.1* 07/04/2015   HGB 8.9* 07/04/2015   HCT 26.3* 07/04/2015   PLT 379 07/04/2015   LYMPHOPCT 9 06/28/2015   MONOPCT 7 06/28/2015   EOSPCT 2 06/28/2015   BASOPCT 1 06/28/2015    CMP: Lab Results  Component Value Date   NA 136 07/04/2015   K 3.8 07/04/2015   CL 105 07/04/2015   CO2 28 07/04/2015   BUN 15 07/04/2015   CREATININE 0.61 07/04/2015   CREATININE 0.58 11/15/2013   PROT 5.3* 06/17/2015   ALBUMIN 2.0* 06/17/2015   BILITOT 0.5 06/17/2015  ALKPHOS 107 06/17/2015   AST 12* 06/17/2015   ALT <5* 06/17/2015  . Discussed about the transfer to  Daughter In Law  vicki at [phone], [phone] and the informed that patient will be transferred to [facility] for further  Treatment  Total Time in preparing paper work, data evaluation and todays exam - 86 minutes  Caspian Deleonardis M.D on 07/04/2015 at 1:09 PM

## 2015-07-04 NOTE — Progress Notes (Signed)
Per RN Case Manager plan is to pursue transfer to another hospital. Clinical Social Worker (CSW) is working on [facility] as a back up plan. [facility] did make a bed offer and family accepted. Doug admissions coordinator at [facility] asked if patient has been to any other facility. Per notes patient spent several weeks at [facility]. CSW made [facility] aware of above. CSW will continue to follow and assist as needed.   Blima Rich, [place] [phone]

## 2015-07-04 NOTE — Progress Notes (Signed)
Called by hospitalist at [facility] for transfer. 74 year old female with past mental history of Parkinson's disease and multiple right hip surgeries complicated with delayed wound healing admitted on 7/7 to [place] for acute blood loss anemia requiring transfusion. Patient managed by infectious disease as outpatient on chronic antibiotics for this wound. Has had issues with fevers despite antibiotics. Infectious disease specialist not available at [facility] vacation and so transfer request here. Today, patient somewhat hypotensive and had fever spike last night. Have requested lactic acid level prior to transfer and have requested stepdown bed. Hospitalist to call back if lactic acid level elevated and may need ICU bed coordinator by critical care.  [place] Hospitalist will also do interim/transfer summary

## 2015-07-04 NOTE — Progress Notes (Signed)
Mrs Johnstone alert and responsive following seizure activity. Nursing continues to monitor and treat as ordered.

## 2015-07-04 NOTE — Progress Notes (Signed)
Patient to be transferred to [facility]. Report called to Jocelyn Lamer, Therapist, sports at [facility]. [facility] to transport. Patient's daughter-in-law, Jocelyn Lamer, was also informed and updated on patient destination. Patient is lethargic at times, confused. VSS stable. Resting quietly between care. Wound nurse changed wound vac dressing. Pressure ulcer dressed per orders. Patient to be disconnected for transport from wound vac. Private sitter at bedside. Will transport shortly.

## 2015-07-04 NOTE — Progress Notes (Signed)
Update:  Called by rapid response team, patient had what looked like seizure activity, and then was unresponsive.  Vital signs stable, good O2 sats on RA, on exam pt is unresponsive except withdraws to pain, PERRL.  Pt no longer shaking/twitching as was reported.  Lungs CTAB without labored breathing, heart RRR with no m/r/g.  Pt did not receive any new medications overnight, and was normal after her evening meds last evening for most of the night prior to this acute episode.  Labs stable.  Patient is here for recurrent complication from right hip surgery.  Consider seizure to be high on differential given the reported sequence of events and symptoms.  Unclear etiology at this time if this is seizure.  Stroke and cardiac process also possible.  Ordered STAT head CT, EKG, Troponin, and Keppra.  If head CT neg/normal will likely need neurology consult.  Jacqulyn Bath, MD [facility] 07/04/2015, 4:16 AM

## 2015-07-04 NOTE — Care Management (Addendum)
Spoke with Dr. Vianne Bulls about transfer to another hospital (tertiary) after meeting with Dr. Fulton Reek physician advisor yesterday during progression/LOS meeting. Sitter at bedside per daughter in law Jocelyn Lamer "was hired by Jocelyn Lamer 18$/hour just to call Jocelyn Lamer with updates since Jocelyn Lamer does not feel she has been communicated with". Sitter cannot "touch or feed patient" per Jocelyn Lamer.  Jocelyn Lamer wants a feeding tube for patient. She wants a NG tube. She is going to contact hospital nutritionist first though. Jocelyn Lamer agrees to transfer to [facility] but her husband  Doristine Counter [phone] will need to agree and she will communicate with him and have him call me. I explained that accepting physician (at [facility])  would be needed in order to arrange transfer. [facility] packet started just in case. Vicki's contact number is [phone] and she would appreciate update. Received callback from Legrand Como stating that his wife Jocelyn Lamer can make patient decisions in Legrand Como absence and Legrand Como wishes for patient to be transferred to [facility] for continued care. Erin RN will contact Jocelyn Lamer when Karen Chafe has been contacted to pick patient up. I notified Ricki with [employer] the need to pick up [facility] which will be left for [facility] behind 1a nurses station.

## 2015-07-05 DIAGNOSIS — E43 Unspecified severe protein-calorie malnutrition: Secondary | ICD-10-CM

## 2015-07-05 DIAGNOSIS — T8189XD Other complications of procedures, not elsewhere classified, subsequent encounter: Secondary | ICD-10-CM

## 2015-07-05 LAB — CBC
HEMATOCRIT: 27.5 % — AB (ref 36.0–46.0)
Hemoglobin: 8.7 g/dL — ABNORMAL LOW (ref 12.0–15.0)
MCH: 28.9 pg (ref 26.0–34.0)
MCHC: 31.6 g/dL (ref 30.0–36.0)
MCV: 91.4 fL (ref 78.0–100.0)
PLATELETS: 372 10*3/uL (ref 150–400)
RBC: 3.01 MIL/uL — ABNORMAL LOW (ref 3.87–5.11)
RDW: 20.7 % — AB (ref 11.5–15.5)
WBC: 3.8 10*3/uL — ABNORMAL LOW (ref 4.0–10.5)

## 2015-07-05 LAB — BASIC METABOLIC PANEL
Anion gap: 7 (ref 5–15)
BUN: 11 mg/dL (ref 6–20)
CHLORIDE: 101 mmol/L (ref 101–111)
CO2: 26 mmol/L (ref 22–32)
Calcium: 8 mg/dL — ABNORMAL LOW (ref 8.9–10.3)
Creatinine, Ser: 0.55 mg/dL (ref 0.44–1.00)
GFR calc non Af Amer: >60 mL/min (ref >60)
Glucose, Bld: 118 mg/dL — ABNORMAL HIGH (ref 65–99)
Potassium: 3.8 mmol/L (ref 3.5–5.1)
Sodium: 134 mmol/L — ABNORMAL LOW (ref 135–145)

## 2015-07-05 LAB — H PYLORI, IGM, IGG, IGA AB
H Pylori IgG: <0.9 U/mL (ref 0.0–0.8)
H. Pylogi, Igm Abs: 22.6 units — ABNORMAL HIGH (ref 0.0–8.9)

## 2015-07-05 LAB — HIGH SENSITIVITY CRP: CRP HIGH SENSITIVITY: 62.97 mg/L — AB (ref 0.00–3.00)

## 2015-07-05 MED ORDER — COLLAGENASE 250 UNIT/GM EX OINT
TOPICAL_OINTMENT | Freq: Every day | CUTANEOUS | Status: DC
  Administered 2015-07-05 – 2015-07-10 (×6): via TOPICAL
  Filled 2015-07-05 (×2): qty 30

## 2015-07-05 MED ORDER — LORAZEPAM 2 MG/ML IJ SOLN
1.0000 mg | Freq: Once | INTRAMUSCULAR | Status: AC
  Administered 2015-07-05: 1 mg via INTRAVENOUS
  Filled 2015-07-05: qty 1

## 2015-07-05 MED ORDER — ENSURE ENLIVE PO LIQD
237.0000 mL | Freq: Three times a day (TID) | ORAL | Status: DC
  Administered 2015-07-05 – 2015-07-09 (×10): 237 mL via ORAL

## 2015-07-05 MED ORDER — SODIUM CHLORIDE 0.9 % IV BOLUS (SEPSIS)
500.0000 mL | Freq: Once | INTRAVENOUS | Status: AC
  Administered 2015-07-05: 500 mL via INTRAVENOUS

## 2015-07-05 MED ORDER — WHITE PETROLATUM GEL
Status: AC
  Administered 2015-07-05: 04:00:00
  Filled 2015-07-05: qty 1

## 2015-07-05 MED ORDER — SODIUM CHLORIDE 0.9 % IV BOLUS (SEPSIS)
1000.0000 mL | Freq: Once | INTRAVENOUS | Status: AC
  Administered 2015-07-05: 1000 mL via INTRAVENOUS

## 2015-07-05 NOTE — Progress Notes (Signed)
[facility] PROGRESS NOTE  Sabrina Duncan EHM:[id number] DOB: [birth year] DOA: 07/04/2015 PCP: Idelle Crouch, MD  Assessment/Plan: Fever/SIRS -I believe most likely related to her urinary tract infection. -Has had no temperatures since 8 PM the evening of 7/12. -Lactic acid normal at 1.5.  UTI -Culture done on 7/11 with greater than 100,000 colonies of gram-negative rods and gram-positive cocci with identification and susceptibilities pending. -Continue Rocephin pending culture data.  Acute blood loss anemia -Secondary to complications from right hip replacement. -Hemoglobin currently stable, no indication for acute transfusion at present.  Right hip wound -Has had a right hip hematoma and delayed surgical healing from initial right hip replacement. -Wound VAC currently in place, appreciate wound care team recommendations. -Has been placed on lifelong antibiotics with clindamycin, rifampin, Flagyl as per infectious diseases, these will be continued this hospitalization.  Multiple decubiti ulcers -Air overlay mattress. -Wound care dressing changes recommendations appreciated.  Questionable seizure activity -This occurred the night prior to transfer to [facility], she was given a dose of Keppra. -I believe this was more so than seizures probable myoclonic jerking in the presence of high temperatures. -Do not believe she requires neurology consultation are long-term treatment with Keppra she were to have repeated seizure activity.  Severe protein caloric malnutrition -Seroquel by dietitian with plans to start sure.  Major depression with psychotic features -Has been having some hallucinations. -Was seen by psychiatry at [facility] with recommendations to increase her Remeron to 15 mg and Seroquel 250 mg at nighttime, we will continue these medications in the hospital.  Code Status: Presumed full code Family Communication: We'll discuss with daughter Jocelyn Lamer  via phone later in the day.  Disposition Plan: To be determined.   Consultants:  None   Antibiotics:  Rocephin   Subjective: Has no specific complaints today, denies chest pain or shortness of breath, gets confused at times.  Objective: Filed Vitals:   07/05/15 0800 07/05/15 0900 07/05/15 1000 07/05/15 1239  BP: 110/60 103/56 110/62   Pulse: 133 121 130   Temp: 98.8 F (37.1 C)   99 F (37.2 C)  TempSrc: Oral   Axillary  Resp: 19     SpO2: 96% 100% 98%     Intake/Output Summary (Last 24 hours) at 07/05/15 1359 Last data filed at 07/05/15 1200  Gross per 24 hour  Intake 1202.5 ml  Output    365 ml  Net  837.5 ml   There were no vitals filed for this visit.  Exam:   General:  Alert, awake, oriented 2  Cardiovascular: Regular rate and rhythm  Respiratory: Clear to auscultation bilaterally  Abdomen: Soft, nontender, nondistended, positive bowel sounds  Extremities: No clubbing, cyanosis or edema, contractures   Neurologic:  Moves all 4 spontaneously  Data Reviewed: Basic Metabolic Panel:  Recent Labs Lab 06/29/15 0436 06/30/15 0355 07/03/15 0537 07/04/15 0453 07/05/15 0245  NA 138 138 140 136 134*  K 4.0 4.1 4.4 3.8 3.8  CL 104 105 107 105 101  CO2 27 27 26 28 26   GLUCOSE 84 101* 109* 119* 118*  BUN 14 11 18 15 11   CREATININE 0.40* 0.37* 0.62 0.61 0.55  CALCIUM 7.7* 7.9* 8.1* 7.9* 8.0*   Liver Function Tests: No results for input(s): AST, ALT, ALKPHOS, BILITOT, PROT, ALBUMIN in the last 168 hours. No results for input(s): LIPASE, AMYLASE in the last 168 hours. No results for input(s): AMMONIA in the last 168 hours. CBC:  Recent Labs Lab 06/28/15 1735 06/29/15 0436  06/30/15 0355 07/03/15 0743 07/04/15 0453 07/05/15 0245  WBC 8.6 5.4 7.6 6.0 3.1* 3.8*  NEUTROABS 7.1*  --   --   --   --   --   HGB 7.3* 7.2* 9.3* 9.2* 8.9* 8.7*  HCT 23.4* 22.4* 27.3* 28.4* 26.3* 27.5*  MCV 88.9 89.2 86.9 88.3 89.1 91.4  PLT 436 420 413 431 379 372    Cardiac Enzymes: No results for input(s): CKTOTAL, CKMB, CKMBINDEX, TROPONINI in the last 168 hours. BNP (last 3 results) No results for input(s): BNP in the last 8760 hours.  ProBNP (last 3 results) No results for input(s): PROBNP in the last 8760 hours.  CBG:  Recent Labs Lab 07/04/15 0401  GLUCAP 121*    Recent Results (from the past 240 hour(s))  Urine culture     Status: None (Preliminary result)   Collection Time: 07/02/15 10:41 PM  Result Value Ref Range Status   Specimen Description URINE, CLEAN CATCH  Final   Special Requests clindamycin, metronidazole, rifampin  Final   Culture   Final    >=100,000 COLONIES/mL GRAM NEGATIVE RODS >=100,000 COLONIES/mL GRAM POSITIVE COCCI IDENTIFICATION AND SUSCEPTIBILITIES TO FOLLOW    Report Status PENDING  Incomplete  Culture, blood (routine x 2)     Status: None (Preliminary result)   Collection Time: 07/04/15 10:32 AM  Result Value Ref Range Status   Specimen Description BLOOD  Final   Special Requests Normal AEROBIC 7MP, ANAEROBIC 7ML  Final   Culture NO GROWTH < 24 HOURS  Final   Report Status PENDING  Incomplete  Culture, blood (routine x 2)     Status: None (Preliminary result)   Collection Time: 07/04/15 10:45 AM  Result Value Ref Range Status   Specimen Description BLOOD  Final   Special Requests Normal AEROBIC 3ML, ANAEROBIC 1ML  Final   Culture NO GROWTH < 24 HOURS  Final   Report Status PENDING  Incomplete  MRSA PCR Screening     Status: None   Collection Time: 07/04/15  5:26 PM  Result Value Ref Range Status   MRSA by PCR NEGATIVE NEGATIVE Final    Comment:        The GeneXpert MRSA Assay (FDA approved for NASAL specimens only), is one component of a comprehensive MRSA colonization surveillance program. It is not intended to diagnose MRSA infection nor to guide or monitor treatment for MRSA infections.   Urine culture     Status: None (Preliminary result)   Collection Time: 07/04/15  7:52 PM   Result Value Ref Range Status   Specimen Description URINE, CATHETERIZED  Final   Special Requests NONE  Final   Culture TOO YOUNG TO READ  Final   Report Status PENDING  Incomplete     Studies: Ct Head Wo Contrast  07/04/2015   CLINICAL DATA:  Initial evaluation for acute seizure.  EXAM: CT HEAD WITHOUT CONTRAST  TECHNIQUE: Contiguous axial images were obtained from the base of the skull through the vertex without intravenous contrast.  COMPARISON:  Prior study from 02/09/2015  FINDINGS: Diffuse prominence of the CSF containing spaces is compatible with generalized cerebral atrophy. Patchy and confluent hypodensity within the periventricular and deep white matter both cerebral hemispheres present, most consistent with chronic small vessel ischemic disease, stable from previous. Remote right cerebellar infarct noted, also stable.  No acute large vessel territory infarct. No mass lesion, midline shift, or mass effect. No extra-axial fluid collection.  Scalp soft tissues within normal limits.  No acute  abnormality about the orbits.  Paranasal sinuses and mastoid air cells are clear.  Calvarium intact.  IMPRESSION: 1. No acute intracranial process identified. 2. 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   Electronically Signed   By: Jeannine Boga M.D.   On: 07/04/2015 05:28    Scheduled Meds: . antiseptic oral rinse  7 mL Mouth Rinse BID  . ARIPiprazole  5 mg Oral BID  . aspirin  81 mg Oral Daily  . atorvastatin  20 mg Oral QHS  . carbidopa-levodopa  2 tablet Oral QID  . Carbidopa-Levodopa ER  2 tablet Oral q1800   And  . entacapone  200 mg Oral q1800  . cefTRIAXone (ROCEPHIN)  IV  1 g Intravenous Q24H  . cholecalciferol  2,000 Units Oral Daily  . clindamycin  300 mg Oral BID  . collagenase   Topical Daily  . docusate sodium  100 mg Oral BID  . feeding supplement (ENSURE ENLIVE)  237 mL Oral TID BM   . ferrous [id number] C-folic acid  1 capsule Oral BID  . metroNIDAZOLE  250 mg Oral 3 times per day  . mirtazapine  15 mg Oral QHS  . QUEtiapine  150 mg Oral QHS  . rifampin  300 mg Oral BID  . rOPINIRole  2 mg Oral TID  . venlafaxine XR  150 mg Oral BID   Continuous Infusions: . sodium chloride 75 mL/hr at 07/04/15 1850    Active Problems:   Essential hypertension   Acute blood loss anemia   Hematoma   Protein-calorie malnutrition, severe   Delayed surgical wound healing   Severe recurrent major depression with psychotic features   UTI (urinary tract infection)   Fever   Seizures   UTI (lower urinary tract infection)    Time spent: 30 minutes. Greater than 50% of this time was spent in direct contact with the patient coordinating care.    Lelon Frohlich  [facility] Pager [phone]  If 7PM-7AM, please contact night-coverage at [email or web address], password [facility] 07/05/2015, 1:59 PM  LOS: 1 day

## 2015-07-05 NOTE — Consult Note (Signed)
   [facility] CM Inpatient Consult   07/05/2015  Sabrina Duncan [birth year] [id number] Referral received for care management assessment.  Patient evaluated for [place] Management services. Patient is not eligible for [facility] Management services because unfortunately, patient's Medicare is not in the delegation for [place] Management services at this time. Will update inpatient care manager of outcome. For questions, please contact: Natividad Brood, RN BSN [place] [facility] Liaison  [phone] business mobile phone

## 2015-07-05 NOTE — Consult Note (Signed)
  I was asked to come by and see Sabrina Duncan to see if there was anything Ortho in [place] can provide in terms of dealing with a chronic right hip issue.  She was transferred here from [place] in [place] due to the need for an Infectious Disease consult as well as her recurrent anemia.  She has had several right hip surgeries this year with one being in [place] to revise a right total hip replacement.  Post-operatively, she had surgery in May and in June of this year by Orthopedics in [place] to treat a hematoma and now treat a chronic wound that has developed over her right lateral hip/thigh area.  According to nursing, a new VAC was placed today over a small wound and that there is exposed hardware at the base of the wound.  This is certainly a difficult situation.  Given her age, co-morbidities, and poor nutrition status, it will be quite difficult to heal this wound.  Taking out all of the hardware likewise can have quite difficult ramifications.  Currently, from my standpoint, there is not an urgent surgical intervention that is needed.  She does need consultation by the Nutritionists here and the primary service should put in a consultation to Dr. Theodoro Kos with Plastic Surgery to see if she has any other recommendations.  I can also see if Dr. Sharol Given can provide any recommendations, but this will have to wait until Monday since he is out of town.  I would continue to [facility] for now, since it does have a better seal than the one she came in with.  I did speak to her family over the phone about the difficult nature of her situation.

## 2015-07-05 NOTE — Care Management Note (Signed)
Case Management Note  Patient Details  Name: Renny Gunnarson MRN: [id number] Date of Birth: 06/23/1941  Subjective/Objective:                 Pt d/c from [place] and admitted with ? Fever, seizure and for ID consultation. Pt with r hip wound vac.   Action/Plan: CM to f/u with disposition needs.  Expected Discharge Date:                  Expected Discharge Plan:     In-House Referral:     Discharge planning Services     Post Acute Care Choice:    Choice offered to:     DME Arranged:    DME Agency:     HH Arranged:    [place] Agency:     Status of Service:     Medicare Important Message Given:    Date Medicare IM Given:    Medicare IM give by:    Date Additional Medicare IM Given:    Additional Medicare Important Message give by:     If discussed at [place] of Stay Meetings, dates discussed:    Additional CoChristopher Merchel (Son) [phone],  Atianna Haidar (Daughter)  [phone] mments: Prior to pt's discharge from [place] arrangements were being made for pt to d/c to [facility]. Family still in agreement of  pt being place @ [facility] per Dr.Hernandez.  Plan is for pt to be d/c on 07/06/15 to [facility] per Dr.Hernandez. CM made Andrea([phone] liaison for [facility]'s  [facility] aware. CM to f/u with disposition needs. Whitman Hero [place], RN 07/05/2015, 2:37 PM

## 2015-07-05 NOTE — Consult Note (Addendum)
WOC wound consult note Pt familiar to [facility] team; refer to recent notes on 7/11 and 7/13 from team member when pt was at [facility].  Pt was transferred to [facility] yesterday. Pt is immobile and is reported to have poor nutritional status. Reason for Consult: Right hip nonhealing full thickness surgical wound, previously followed by surgery team at [facility]. Pressure Ulcer POA: Yes Sacrum Stage 4 pressure injury: 4cm x 2 cm x .2 cm, 80% yellow slough, small amt yellow drainage, no odor. Bone palpable to inner wound bed which is 20% red.  Right buttock gluteal fold: 1 cm x 1 cm x 0.1 cm stage 2: moisture associated skin damage (MASD) to surrounding bilat buttocks, red and macerated. Left buttock gluteal fold: 1.5X1.5X.1cm stage 2 Left elbow: stage 4: 1.3X.8X.3cm, yellow wound bed with mod amt green-tinged drainage, no odor,bone palpable.  Bilateral heels: reddened stage 1 areas approx 1X1cm, no open wound or drainage.  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  Generalized dark purple discoloration to skin at this location. Right hip with full thickness post-op wound.  Sutures surrounding wound above and below open wound.  [name]3X3X4cm, tunneling to 5 cm at 12:00 o'clock and 6:00 o'clock.  Hardware visible and palpable to inner wound bed.  Mod amt pink drainage to cannister, no odor.  Applied one piece black foam to 172mm cont suction.  Pt did not appear to be in discomfort.   Plan:  Bedside nurse to change Vac dressing Q Tues/Thurs/Sat.  Continue Santyl to chemically debride nonviable tissue to sacrum wound. Barrier cream to protect and repel moisture from buttocks. Float heels to reduce pressure.  Pt is on an air mattress to reduce pressure.  Optimize nutrition.  Aquacel to absrob drainage and provide antimicrobial benefits to left elbow. Discussed plan of care with daughter, Jocelyn Lamer over the phone. She denies further questions at this time. One hour spent performing this  consult. Please re-consult if further assistance is needed.  Thank-you,  Julien Girt MSN, [place], [place], [place], [place]

## 2015-07-05 NOTE — Progress Notes (Signed)
UR COMPLETED  

## 2015-07-05 NOTE — Progress Notes (Signed)
@  2218 Paged K. Schorr of [facility] regarding pts elevated HR (110s-160s ST vs. 80s-90s NSR on admission to [facility]). Appears to be anxiety related as HR lowers to 110s-120s when this RN present in room and reassuring pt. Requested restart of PTA med (Ativan). Also questioned if additional bolus or HR medication warranted. Awaiting return page.  @2237  Page returned. Bolus and IV Ativan ordered. Will administer and continue to monitor.

## 2015-07-05 NOTE — Progress Notes (Signed)
Initial Nutrition Assessment  DOCUMENTATION CODES:   Severe malnutrition in context of chronic illness  INTERVENTION:    Ensure Enlive PO TID, each supplement provides 350 kcal and 20 grams of protein  NUTRITION DIAGNOSIS:   Increased nutrient needs related to wound healing as evidenced by estimated needs.  GOAL:   Patient will meet greater than or equal to 90% of their needs  MONITOR:   PO intake, Supplement acceptance, Labs, Weight trends, Skin  REASON FOR ASSESSMENT:   Malnutrition Screening Tool    ASSESSMENT:   Patient is a 74 year old woman with multiple medical comorbidities who was transferred from [place] today as it was thought patient required an ID consultation and this was not readily available at [facility] today. Patient was admitted to [facility] on 7/7 for acute blood loss anemia resulting from a right hip hematoma. 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 Ola Spurr, infectious disease. Has a wound VAC to the right hip as well. The day prior to transfer she developed a temperature of 102.4 in the context of an apparent UTI and had a possible seizure versus myoclonic jerking. I do not see where this urine infection was treated. We received request for transfer today for a potential ID consultation as patient continues to spike temperatures despite long-term antibiotics for her right hip hardware infection.   Sitter in room with patient reports that she ate a few bites of her breakfast this AM. She only has upper dentures, no teeth or dentures on the bottom. Currently on a soft diet; Nutritional Services Ambassador is assisting patient with meal options.   Patient with 12% weight loss within the past 2 months. She has multiple wounds and needs increased protein needs to support healing. Nutrition-Focused physical exam completed.  Findings are mild-moderate fat depletion, severe muscle depletion, and no edema.   Diet Order:  DIET SOFT Room service appropriate?: Yes; Fluid consistency:: Thin  Skin:   stage IV pressure ulcer to elbow, 2 stage II pressure ulcers to buttocks, stage 1 pressure ulcer to heel, stage IV pressure ulcer to coccyx  Last BM:  7/13  Height:   Ht Readings from Last 1 Encounters:  06/28/15 5\' 6"  (1.676 m)    Weight:   Wt Readings from Last 1 Encounters:  07/03/15 122 lb (55.339 kg)    Ideal Body Weight:  59.1 kg  Wt Readings from Last 10 Encounters:  07/03/15 122 lb (55.339 kg)  06/27/15 113 lb 12.1 oz (51.6 kg)  06/27/15 113 lb (51.256 kg)  06/23/15 90 lb (40.824 kg)  06/13/15 122 lb (55.339 kg)  05/28/15 127 lb 3.2 oz (57.698 kg)  05/09/15 138 lb (62.596 kg)  10/02/14 143 lb 8 oz (65.091 kg)  02/13/14 149 lb 12 oz (67.926 kg)  09/26/13 154 lb (69.854 kg)    BMI:  19.7  Estimated Nutritional Needs:   Kcal:  1650-1850  Protein:  85-100 gm  Fluid:  1.8 L  EDUCATION NEEDS:   Education needs addressed   Molli Barrows, [place], LDN, [place] Pager [phone] After Hours Pager [phone]

## 2015-07-06 ENCOUNTER — Inpatient Hospital Stay (HOSPITAL_COMMUNITY): Payer: Medicare Other

## 2015-07-06 LAB — URINE CULTURE

## 2015-07-06 LAB — CBC
HCT: 28.1 % — ABNORMAL LOW (ref 36.0–46.0)
HEMOGLOBIN: 8.6 g/dL — AB (ref 12.0–15.0)
MCH: 27.7 pg (ref 26.0–34.0)
MCHC: 30.6 g/dL (ref 30.0–36.0)
MCV: 90.4 fL (ref 78.0–100.0)
PLATELETS: 386 10*3/uL (ref 150–400)
RBC: 3.11 MIL/uL — AB (ref 3.87–5.11)
RDW: 21.1 % — AB (ref 11.5–15.5)
WBC: 6.1 10*3/uL (ref 4.0–10.5)

## 2015-07-06 LAB — TROPONIN I
TROPONIN I: 1.08 ng/mL — AB (ref <0.031)
TROPONIN I: 0.85 ng/mL — AB (ref <0.031)

## 2015-07-06 LAB — LACTIC ACID, PLASMA: LACTIC ACID, VENOUS: 2 mmol/L (ref 0.5–2.0)

## 2015-07-06 LAB — PROCALCITONIN: Procalcitonin: 0.18 ng/mL

## 2015-07-06 MED ORDER — TRAMADOL HCL 50 MG PO TABS: 50.0000 mg | ORAL_TABLET | Freq: Four times a day (QID) | ORAL | Status: DC | PRN

## 2015-07-06 MED ORDER — MIRTAZAPINE 15 MG PO TABS: 15.0000 mg | ORAL_TABLET | Freq: Every day | ORAL | Status: DC

## 2015-07-06 MED ORDER — LINEZOLID 600 MG/300ML IV SOLN
600.0000 mg | Freq: Two times a day (BID) | INTRAVENOUS | Status: DC
  Administered 2015-07-06 – 2015-07-09 (×7): 600 mg via INTRAVENOUS
  Filled 2015-07-06 (×8): qty 300

## 2015-07-06 MED ORDER — QUETIAPINE FUMARATE 50 MG PO TABS: 150.0000 mg | ORAL_TABLET | Freq: Every day | ORAL | Status: DC

## 2015-07-06 MED ORDER — SODIUM CHLORIDE 0.9 % IV BOLUS (SEPSIS)
500.0000 mL | Freq: Once | INTRAVENOUS | Status: AC
  Administered 2015-07-06: 500 mL via INTRAVENOUS

## 2015-07-06 MED ORDER — CEFTRIAXONE SODIUM IN DEXTROSE 20 MG/ML IV SOLN: 1.0000 g | INTRAVENOUS | Status: DC

## 2015-07-06 MED ORDER — NITROFURANTOIN MONOHYD MACRO 100 MG PO CAPS
100.0000 mg | ORAL_CAPSULE | Freq: Two times a day (BID) | ORAL | Status: DC
Start: 2015-07-06 — End: 2015-07-10

## 2015-07-06 MED ORDER — LORAZEPAM 0.5 MG PO TABS: 0.5000 mg | ORAL_TABLET | Freq: Two times a day (BID) | ORAL | Status: DC | PRN

## 2015-07-06 NOTE — Progress Notes (Signed)
Pt obs sweating and shaking, MD/N, pt HR 130-160 (MD aware), MD @ BS new orders received, nursing at [facility]

## 2015-07-06 NOTE — Progress Notes (Signed)
Patient was supposed to go to [facility] today. Called by RN to examine patient at bedside with concerns for seizure. All 4 extremities are shaking but she is able to answer simple questions. Very tremerous; has h/o parkinson's disease. Has positive urine cx, now resulted VRE and citrobacter and has profuse diaphoresis. Will check blood cultures as she may be bacteremic. Will also order EKG and CXR. Troponins to be drawn as well as there appear to be ST depressions on cardiac monitor. Not stable for transfer at present. Will give a saline bolus as she remains with sinus tachycardia and has significant insensible losses from diaphoresis. VRE is NOT being adequately treated; will started Linezolid, d/w Dr. Megan Salon. Will continue to follow.  Domingo Mend, MD [facility] Pager: [phone]

## 2015-07-06 NOTE — Progress Notes (Signed)
CRITICAL VALUE ALERT  Critical value received: Trop I 1.08  Date of notification:  07/15 1705  Time of notification:  1705  Critical value read back:Yes.    Nurse who received alert:  Di Kindle   MD notified (1st page):  Jerilee Hoh  Time of first page:  1705  MD notified (2nd page):  Time of second page:  Responding MD: Jerilee Hoh  Time MD responded:  [phone]

## 2015-07-06 NOTE — Discharge Summary (Addendum)
Physician Discharge Summary  Sabrina Duncan VOZ:[id number] DOB: 1941/01/21 DOA: 07/04/2015  PCP: Idelle Crouch, MD  Admit date: 07/04/2015 Discharge date: 07/06/2015  Time spent: 45 minutes  Recommendations for Outpatient Follow-up:  -Will be discharged to [facility] today for continued care of wounds, treatment of her UTI and other chronic conditions.   Discharge Diagnoses:  Active Problems:   Essential hypertension   Acute blood loss anemia   Hematoma   Protein-calorie malnutrition, severe   Delayed surgical wound healing   Severe recurrent major depression with psychotic features   UTI (urinary tract infection)   Fever   Seizures   UTI (lower urinary tract infection)   Discharge Condition: Stable  There were no vitals filed for this visit.  History of present illness:  Patient is a 74 year old woman with multiple medical comorbidities who was transferred from [place] today as it was thought patient required an ID consultation and this was not readily available at [facility] today. Patient was admitted to [facility] on 7/7 for acute blood loss anemia resulting from a right hip hematoma. 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 Ola Spurr, infectious disease. Has a wound VAC to the right hip as well. The day prior to transfer she developed a temperature of 102.4 in the context of an apparent UTI and had a possible seizure versus myoclonic jerking. I do not see where this urine infection was treated. We received request for transfer today for a potential ID consultation as patient continues to spike temperatures despite long-term antibiotics for her right hip hardware infection. Patient today is mostly concerned about her hallucinations and how bothersome they are.   Hospital Course:   Fever/Sirs -Most likely related to her  UTI. -Has been afebrile since admission with [facility]. -Lactic acid was normal at 1.4.  UTI -Culture done on 7/11 with Citrobacter and enterococcus faecium. -Antibiotics regimen discussed with ID pharmacist. For the Citrobacter will do 5 more days of IV Rocephin since she will be going to LTAC, for the enterococcus will do Macrobid 100 mg twice daily for 7 days. Because the sensitivity is intermediate, recommend reculturing after a week to document clearance.  Chronic right hip wound -Has had delayed surgical wound hematomas and multiple surgeries after her initial right hip replacement. -Continue wound VAC, Dr. Ninfa Linden, with orthopedic surgery has reviewed films and discuss case with her daughter, we all agree that this is a difficult situation in that there is no clear answer. Follow-up with her outpatient orthopedist is recommended. -She has been placed on lifelong antibiotics with clindamycin and rifampin as per infectious diseases and these will be continued.  Multiple decubiti ulcers -Air overlay mattress. -Appreciate wound care recommendations.  Questionable seizure activity -Believe this was smaller myoclonic jerking the presence of high temperatures. -Do not believe she requires treatment with long-term Keppra admission were to have repeated seizure activity.  Severe protein caloric malnutrition -Seen by our dietitian, plan to start ensure.  Major depression with psychotic features -Continues to have hallucinations. -Was seen by psychiatry at [facility], recommendations for Abilify, Remeron, Seroquel have been continued.  Sinus tachycardia -With rates as high as the 140s. -She does appear volume depleted on exam, was given a liter bolus and will likely need further IV fluid repletion at [facility].  Procedures:  None   Consultations:  None  Discharge Instructions      Discharge  Instructions    Diet - low sodium heart healthy    Complete by:  As directed       Increase activity slowly    Complete by:  As directed             Medication List    STOP taking these medications        carbidopa-levodopa 50-200 MG per tablet  Commonly known as:  SINEMET CR     metroNIDAZOLE 250 MG tablet  Commonly known as:  FLAGYL      TAKE these medications        acetaminophen 325 MG tablet  Commonly known as:  TYLENOL  Take 2 tablets (650 mg total) by mouth every 6 (six) hours as needed for mild pain (or Fever >/= 101).     ARIPiprazole 5 MG tablet  Commonly known as:  ABILIFY  Take 1 tablet (5 mg total) by mouth daily.     aspirin 81 MG chewable tablet  Chew 81 mg by mouth daily.     atorvastatin 20 MG tablet  Commonly known as:  LIPITOR  Take 20 mg by mouth at bedtime.     carbidopa-levodopa-entacapone 50-200-200 MG per tablet  Commonly known as:  STALEVO  Take 1 tablet by mouth every evening.     cefTRIAXone 20 MG/ML IVPB  Commonly known as:  ROCEPHIN  Inject 50 mLs (1 g total) into the vein daily.     clindamycin 300 MG capsule  Commonly known as:  CLEOCIN  Take 300 mg by mouth 2 (two) times daily.     collagenase ointment  Commonly known as:  SANTYL  Apply topically daily.     DSS 100 MG Caps  Take 100 mg by mouth 2 (two) times daily.     feeding supplement (PRO-STAT SUGAR FREE 64) Liqd  Take 30 mLs by mouth 2 (two) times daily.     FERROCITE 324 (106 FE) MG Tabs  Generic drug:  Ferrous Fumarate  Take 1 tablet by mouth 2 (two) times daily.     ferrous fumarate 325 (106 FE) MG Tabs tablet  Commonly known as:  HEMOCYTE - 106 mg FE  Take 1 tablet (106 mg of iron total) by mouth 2 (two) times daily.     LORazepam 0.5 MG tablet  Commonly known as:  ATIVAN  Take 1 tablet (0.5 mg total) by mouth every 12 (twelve) hours as needed for anxiety.     metoprolol 50 MG tablet  Commonly known as:  LOPRESSOR  Take 50 mg by mouth 2 (two) times daily.     mirtazapine 15 MG tablet  Commonly known as:  REMERON  Take 1 tablet (15 mg  total) by mouth at bedtime.     nitrofurantoin (macrocrystal-monohydrate) 100 MG capsule  Commonly known as:  MACROBID  Take 1 capsule (100 mg total) by mouth 2 (two) times daily. For 7 days     QUEtiapine 50 MG tablet  Commonly known as:  SEROQUEL  Take 3 tablets (150 mg total) by mouth at bedtime.     RA VITAMIN D-3 2000 UNITS Caps  Generic drug:  Cholecalciferol  Take 1 capsule by mouth daily.     rifampin 300 MG capsule  Commonly known as:  RIFADIN  Take 300 mg by mouth 2 (two) times daily.     rOPINIRole 2 MG tablet  Commonly known as:  REQUIP  Take 2 mg by mouth 3 (three) times daily.     senna  8.6 MG Tabs tablet  Commonly known as:  SENOKOT  Take 1 tablet (8.6 mg total) by mouth daily as needed for mild constipation.     traMADol 50 MG tablet  Commonly known as:  ULTRAM  Take 1 tablet (50 mg total) by mouth every 6 (six) hours as needed for moderate pain.     venlafaxine XR 150 MG 24 hr capsule  Commonly known as:  EFFEXOR-XR  Take 150 mg by mouth 2 (two) times daily.     vitamin B-12 1000 MCG tablet  Commonly known as:  CYANOCOBALAMIN  Take 1,000 mcg by mouth daily.       Allergies  Allergen Reactions  . Morphine And Related Other (See Comments)    Reaction:  Hallucinations   . Oxycodone Other (See Comments)    Reaction:  Unknown   . Sulfa Antibiotics Itching  . Tazobactam Other (See Comments)    Reaction:  Unknown   . Adhesive [Tape] Rash  . Isosorbide Rash      The results of significant diagnostics from this hospitalization (including imaging, microbiology, ancillary and laboratory) are listed below for reference.    Significant Diagnostic Studies: Ct Head Wo Contrast  07/04/2015   CLINICAL DATA:  Initial evaluation for acute seizure.  EXAM: CT HEAD WITHOUT CONTRAST  TECHNIQUE: Contiguous axial images were obtained from the base of the skull through the vertex without intravenous contrast.  COMPARISON:  Prior study from 02/09/2015  FINDINGS:  Diffuse prominence of the CSF containing spaces is compatible with generalized cerebral atrophy. Patchy and confluent hypodensity within the periventricular and deep white matter both cerebral hemispheres present, most consistent with chronic small vessel ischemic disease, stable from previous. Remote right cerebellar infarct noted, also stable.  No acute large vessel territory infarct. No mass lesion, midline shift, or mass effect. No extra-axial fluid collection.  Scalp soft tissues within normal limits.  No acute abnormality about the orbits.  Paranasal sinuses and mastoid air cells are clear.  Calvarium intact.  IMPRESSION: 1. No acute intracranial process identified. 2. 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   Electronically Signed   By: Jeannine Boga M.D.   On: 07/04/2015 05:28   Mr Hip Right Wo Contrast  06/27/2015   CLINICAL DATA:  Total right hip replacement with persistent bleeding from the wound, anemia, and hypotension with transfusions.  EXAM: MR OF THE RIGHT HIP WITHOUT CONTRAST  TECHNIQUE: Multiplanar, multisequence MR imaging was performed. No intravenous contrast was administered. Metal artifact reduction protocol sequencing was performed.  COMPARISON:  Multiple exams, including 05/09/2015  FINDINGS: Greater than normal metal artifact along the right hip due to the lateral cage, cerclage wires, and hip prosthesis. This results in more artifact than is often encountered.  We do observe a a 13.1 by approximately 4.5 by 7.6 cm fluid collection along the posterolateral trochanteric margin of the right hip prosthesis appearing to extend towards the skin along the wound. This has high inversion recovery and low T1 signal characteristics and may reflect blood products. Infection/abscess not readily excluded.  Proximal hamstring tendons intact. The degree of artifact precludes assessment of the bone of the right  hip. There is deformity of the right inferior pubic ramus from an old fracture as well as indistinctness of the quadrilateral plate from known prior acetabular injury.  The uterus, urinary bladder, and visualized bowel appear unremarkable.  Prominent atrophy of the right gluteal musculature.  IMPRESSION: 1. Persistent fluid collection  lateral to the projected trochanteric region of the right hip. This probably drains to the wound based on continuity with the scan. The lateral cage, cerclage, and total hip prosthesis generate more than the normal amount of susceptibility artifact resulting in a considerable degree of metal artifact obscuration of surrounding structures despite the use of metal artifact reduction sequences. 2. Prominent atrophy of the right gluteal musculature. 3. Deformity of the right inferior pubic ramus with known deformity of the right acetabulum. Bony structures could be better assessed by CT, if clinically warranted.   Electronically Signed   By: Van Clines M.D.   On: 06/27/2015 17:38   Dg Chest Port 1 View  07/02/2015   CLINICAL DATA:  Shortness of Breath  EXAM: PORTABLE CHEST - 1 VIEW  COMPARISON:  None.  FINDINGS: Patient is rotated. There is no edema or consolidation. Heart is mildly enlarged with pulmonary vascularity within normal limits. No adenopathy. There is atherosclerotic change in aorta. There is a total shoulder replacement on the right. There is evidence of an old healed fracture of the proximal left humeral metaphysis. There is also an old healed fracture of the lateral left clavicle. There are surgical clips in the left axillary region with evidence of prior left mastectomy.  IMPRESSION: No edema or consolidation.  Mild cardiac enlargement.   Electronically Signed   By: Lowella Grip III M.D.   On: 07/02/2015 13:33   Dg Hip Unilat With Pelvis 2-3 Views Right  06/27/2015   CLINICAL DATA:  RIGHT hip surgery in April and June, drainage from RIGHT hip wound  EXAM:  RIGHT HIP (WITH PELVIS) 2-3 VIEWS  COMPARISON:  06/19/2015  FINDINGS: Diffuse osseous demineralization.  BILATERAL hip prostheses.  Deformity of proximal LEFT femur and greater trochanter.  Multiple cerclage wires and lateral plate at proximal RIGHT femur.  Several displaced bone fragments are noted cranial to the RIGHT greater trochanter.  Questionable osseous lucency involving the greater trochanter and at the proximal margin of the medial femur at the intertrochanteric region.  Unable to exclude osteomyelitis with this appearance.  Old fracture deformities of the pubic rami noted.  No acute pelvic abnormalities seen.  IMPRESSION: BILATERAL hip prostheses.  Questionable bone destruction/lucency involving the proximal RIGHT femur versus resorption secondary to recent surgery; unable to exclude osteomyelitis with this appearance.  Significant osseous demineralization with old healed pubic rami fractures.   Electronically Signed   By: Lavonia Dana M.D.   On: 06/27/2015 13:01   Dg Hip Unilat With Pelvis 2-3 Views Right  06/19/2015   CLINICAL DATA:  Right hip pain.  EXAM: RIGHT HIP (WITH PELVIS) 2-3 VIEWS  COMPARISON:  Radiographs dated 05/12/2015 and CT scan dated 05/09/2015  FINDINGS: There is dystrophic calcification in the soft tissues at the site of the wall fracture of the medial wall of the right acetabulum. There is minimal displacement at that site. Small displaced bone fragments are seen in the soft tissues above the right hip but these appear less prominent than on the prior study. Right hip hardware appears unchanged. Old deformities of the pubic rami bilaterally.  No appreciable significant change in the appearance of the right hip since the radiographs of 05/12/2015.  IMPRESSION: Essentially stable appearance of the right hip since 05/12/2015.   Electronically Signed   By: Lorriane Shire M.D.   On: 06/19/2015 18:56    Microbiology: Recent Results (from the past 240 hour(s))  Urine culture      Status: None (Preliminary result)  Collection Time: 07/02/15 10:41 PM  Result Value Ref Range Status   Specimen Description URINE, CLEAN CATCH  Final   Special Requests clindamycin, metronidazole, rifampin  Final   Culture   Final    >=100,000 COLONIES/mL CITROBACTER AMALONATICUS >=100,000 COLONIES/mL ENTEROCOCCUS FAECIUM HOLDING TO RULE OUT VRE    Report Status PENDING  Incomplete   Organism ID, Bacteria CITROBACTER AMALONATICUS  Final   Organism ID, Bacteria ENTEROCOCCUS FAECIUM  Final      Susceptibility   Citrobacter amalonaticus - MIC*    CEFAZOLIN 32 RESISTANT Resistant     CEFTRIAXONE <=1 SENSITIVE Sensitive     CIPROFLOXACIN 2 INTERMEDIATE Intermediate     GENTAMICIN <=1 SENSITIVE Sensitive     IMIPENEM <=0.25 SENSITIVE Sensitive     NITROFURANTOIN 64 INTERMEDIATE Intermediate     TRIMETH/SULFA <=20 SENSITIVE Sensitive     * >=100,000 COLONIES/mL CITROBACTER AMALONATICUS   Enterococcus faecium - MIC*    AMPICILLIN >=32 RESISTANT Resistant     NITROFURANTOIN 64 INTERMEDIATE Intermediate     CIPROFLOXACIN Value in next row Resistant      RESISTANT>=8    LEVOFLOXACIN Value in next row Resistant      RESISTANT>=8    TETRACYCLINE Value in next row Resistant      RESISTANT>=16    GENTAMICIN SYNERGY Value in next row Sensitive      RESISTANT>=16    * >=100,000 COLONIES/mL ENTEROCOCCUS FAECIUM  Wound culture     Status: None (Preliminary result)   Collection Time: 07/04/15 10:10 AM  Result Value Ref Range Status   Specimen Description BUTTOCKS  Final   Special Requests NONE  Final   Gram Stain   Final    FEW WBC SEEN FEW GRAM NEGATIVE RODS RARE GRAM VARIABLE ROD RARE GRAM POSITIVE COCCI IN PAIRS    Culture PENDING  Incomplete   Report Status PENDING  Incomplete  Culture, blood (routine x 2)     Status: None (Preliminary result)   Collection Time: 07/04/15 10:32 AM  Result Value Ref Range Status   Specimen Description BLOOD  Final   Special Requests Normal AEROBIC  7MP, ANAEROBIC 7ML  Final   Culture NO GROWTH 2 DAYS  Final   Report Status PENDING  Incomplete  Culture, blood (routine x 2)     Status: None (Preliminary result)   Collection Time: 07/04/15 10:45 AM  Result Value Ref Range Status   Specimen Description BLOOD  Final   Special Requests Normal AEROBIC 3ML, ANAEROBIC 1ML  Final   Culture NO GROWTH 2 DAYS  Final   Report Status PENDING  Incomplete  MRSA PCR Screening     Status: None   Collection Time: 07/04/15  5:26 PM  Result Value Ref Range Status   MRSA by PCR NEGATIVE NEGATIVE Final    Comment:        The GeneXpert MRSA Assay (FDA approved for NASAL specimens only), is one component of a comprehensive MRSA colonization surveillance program. It is not intended to diagnose MRSA infection nor to guide or monitor treatment for MRSA infections.   Urine culture     Status: None (Preliminary result)   Collection Time: 07/04/15  7:52 PM  Result Value Ref Range Status   Specimen Description URINE, CATHETERIZED  Final   Special Requests NONE  Final   Culture TOO YOUNG TO READ  Final   Report Status PENDING  Incomplete     Labs: Basic Metabolic Panel:  Recent Labs Lab 06/30/15 0355 07/03/15 0537 07/04/15  0453 07/05/15 0245  NA 138 140 136 134*  K 4.1 4.4 3.8 3.8  CL 105 107 105 101  CO2 27 26 28 26   GLUCOSE 101* 109* 119* 118*  BUN 11 18 15 11   CREATININE 0.37* 0.62 0.61 0.55  CALCIUM 7.9* 8.1* 7.9* 8.0*   Liver Function Tests: No results for input(s): AST, ALT, ALKPHOS, BILITOT, PROT, ALBUMIN in the last 168 hours. No results for input(s): LIPASE, AMYLASE in the last 168 hours. No results for input(s): AMMONIA in the last 168 hours. CBC:  Recent Labs Lab 06/30/15 0355 07/03/15 0743 07/04/15 0453 07/05/15 0245  WBC 7.6 6.0 3.1* 3.8*  HGB 9.3* 9.2* 8.9* 8.7*  HCT 27.3* 28.4* 26.3* 27.5*  MCV 86.9 88.3 89.1 91.4  PLT 413 431 379 372   Cardiac Enzymes: No results for input(s): CKTOTAL, CKMB, CKMBINDEX,  TROPONINI in the last 168 hours. BNP: BNP (last 3 results) No results for input(s): BNP in the last 8760 hours.  ProBNP (last 3 results) No results for input(s): PROBNP in the last 8760 hours.  CBG:  Recent Labs Lab 07/04/15 0401  GLUCAP 121*       Signed:  HERNANDEZ ACOSTA,ESTELA  [facility] Pager: [phone] 07/06/2015, 9:59 AM

## 2015-07-07 ENCOUNTER — Inpatient Hospital Stay (HOSPITAL_COMMUNITY): Payer: Medicare Other

## 2015-07-07 LAB — PROCALCITONIN: Procalcitonin: 0.38 ng/mL

## 2015-07-07 LAB — TROPONIN I: Troponin I: 0.73 ng/mL (ref <0.031)

## 2015-07-07 MED ORDER — LORAZEPAM 2 MG/ML IJ SOLN
INTRAMUSCULAR | Status: AC
  Filled 2015-07-07: qty 1

## 2015-07-07 MED ORDER — SODIUM CHLORIDE 0.9 % IV SOLN
250.0000 mg | Freq: Four times a day (QID) | INTRAVENOUS | Status: DC
  Administered 2015-07-07 – 2015-07-09 (×8): 250 mg via INTRAVENOUS
  Filled 2015-07-07 (×9): qty 250

## 2015-07-07 MED ORDER — SODIUM CHLORIDE 0.9 % IV BOLUS (SEPSIS)
500.0000 mL | Freq: Once | INTRAVENOUS | Status: AC
  Administered 2015-07-07: 500 mL via INTRAVENOUS

## 2015-07-07 MED ORDER — LORAZEPAM 2 MG/ML IJ SOLN
1.0000 mg | Freq: Once | INTRAMUSCULAR | Status: AC
  Administered 2015-07-07: 1 mg via INTRAMUSCULAR

## 2015-07-07 MED ORDER — LORAZEPAM 2 MG/ML IJ SOLN: 1.0000 mg | Freq: Once | INTRAMUSCULAR | Status: DC

## 2015-07-07 NOTE — Progress Notes (Signed)
BP in the 80s. Dr. Jerilee Hoh called. New orders received. Will continue to follow.

## 2015-07-07 NOTE — Progress Notes (Signed)
UR COMPLETED  

## 2015-07-07 NOTE — Progress Notes (Signed)
ANTIBIOTIC CONSULT NOTE   Pharmacy Consult for primaxin Indication: UTI   Assessment:  ID: linezolid for VRE/citrobacter in UC. To check BC. Started linezolid per Dr. Megan Salon. rifampin/clinda per ID for previous wound culture. ID now following fever/sepsis- MD notes likely related to UTI, CXR 7/16 for [facility] line placement also with an infiltrate of her left base, consider pneumonia. HCAP: MD added imipenem which should also cover her Citrobacter in urine and discontinue Rocephin. (no orders yet) WBC 6.1 on 7/15, afebrile SCR 0.55 on 7/14, No SCr today , estimated Crcl ~ 61ml/min , I/O 3105/975  7/15 linezolid IV>> (need x 7 days total) 7/15 CTX>>7/16 7/15 rifampin po>> 7/15 clinda po>> 7/16 Add imipenem   7/15 UC>>VRE/citrobacter 7/15 BC>>ngtd  Goal of Therapy:  Resolution of infection  Plan:  Primaxin 250 q6 hours Pharmacy will continue to follow.   Vital Signs: Temp: 98.7 F (37.1 C) (07/16 1530) Temp Source: Axillary (07/16 1530) BP: 87/50 mmHg (07/16 1530) Pulse Rate: 108 (07/16 1530) Intake/Output from previous day: 07/15 0701 - 07/16 0700 In: 3105 [P.O.:1080; I.V.:1725; IV Piggyback:300] Out: 975 [Urine:725; Drains:250] Intake/Output from this shift: Total I/O In: 618.8 [I.V.:318.8; IV Piggyback:300] Out: 300 [Urine:250; Drains:50]  Labs:  Recent Labs  07/05/15 0245 07/06/15 1525  WBC 3.8* 6.1  HGB 8.7* 8.6*  PLT 372 386  CREATININE 0.55  --    Estimated Creatinine Clearance: 55.5 mL/min (by C-G formula based on Cr of 0.55). No results for input(s): VANCOTROUGH, VANCOPEAK, VANCORANDOM, GENTTROUGH, GENTPEAK, GENTRANDOM, TOBRATROUGH, TOBRAPEAK, TOBRARND, AMIKACINPEAK, AMIKACINTROU, AMIKACIN in the last 72 hours.   Microbiology: Recent Results (from the past 720 hour(s))  Urine culture     Status: None   Collection Time: 06/18/15  1:39 PM  Result Value Ref Range Status   Specimen Description URINE, RANDOM  Final   Special Requests NONE  Final    Culture   Final    MULTIPLE SPECIES PRESENT, SUGGEST RECOLLECTION IF CLINICALLY INDICATED   Report Status 06/21/2015 FINAL  Final  Urine culture     Status: None   Collection Time: 06/19/15  6:46 PM  Result Value Ref Range Status   Specimen Description URINE, CLEAN CATCH  Final   Special Requests NONE  Final   Culture NO GROWTH 1 DAY  Final   Report Status 06/21/2015 FINAL  Final  Wound culture     Status: None   Collection Time: 06/19/15  6:46 PM  Result Value Ref Range Status   Specimen Description HIP  Final   Special Requests Immunocompromised  Final   Gram Stain   Final    FEW WBC SEEN FEW GRAM NEGATIVE RODS RARE GRAM POSITIVE RODS RARE GRAM POSITIVE COCCI IN PAIRS IN CLUSTERS    Culture RARE GROWTH COAGULASE NEGATIVE STAPHYLOCOCCUS  Final   Report Status 06/24/2015 FINAL  Final  Anaerobic culture     Status: None   Collection Time: 06/20/15  1:06 AM  Result Value Ref Range Status   Specimen Description WOUND  Final   Special Requests NONE  Final   Culture   Final    MODERATE GROWTH PREVOTELLA BIVIA BETA LACTAMASE POSITIVE    Report Status 06/25/2015 FINAL  Final   Organism ID, Bacteria PREVOTELLA BIVIA  Final  Anaerobic culture     Status: None   Collection Time: 06/20/15  2:00 AM  Result Value Ref Range Status   Specimen Description WOUND  Final   Special Requests NONE  Final   Culture   Final  MODERATE GROWTH PREVOTELLA BIVIA BETA LACTAMASE POSITIVE    Report Status 06/25/2015 FINAL  Final   Organism ID, Bacteria PREVOTELLA BIVIA  Final  Wound culture     Status: None   Collection Time: 06/20/15  2:00 AM  Result Value Ref Range Status   Specimen Description HIP  Final   Special Requests NONE  Final   Gram Stain   Final    FEW WBC SEEN FEW GRAM NEGATIVE RODS RARE GRAM POSITIVE RODS    Culture NO GROWTH 3 DAYS  Final   Report Status 06/24/2015 FINAL  Final  Urine culture     Status: None   Collection Time: 07/02/15 10:41 PM  Result Value Ref Range  Status   Specimen Description URINE, CLEAN CATCH  Final   Special Requests clindamycin, metronidazole, rifampin  Final   Culture   Final    >=100,000 COLONIES/mL CITROBACTER AMALONATICUS >=100,000 COLONIES/mL ENTEROCOCCUS FAECIUM CRITICAL RESULT CALLED TO, READ BACK BY AND VERIFIED WITH: JOANNA LINDSAY MC3S ON 07/06/15 AT 0805 BY JEF VRE HAVE INTRINSIC RESISTANCE TO MOST COMMONLY USED ANTIBIOTICS AND THE ABILITY TO ACQUIRE RESISTANCE TO MOST AVAILABLE ANTIBIOTICS.    Report Status 07/06/2015 FINAL  Final   Organism ID, Bacteria CITROBACTER AMALONATICUS  Final   Organism ID, Bacteria ENTEROCOCCUS FAECIUM  Final      Susceptibility   Citrobacter amalonaticus - MIC*    CEFAZOLIN 32 RESISTANT Resistant     CEFTRIAXONE <=1 SENSITIVE Sensitive     CIPROFLOXACIN 2 INTERMEDIATE Intermediate     GENTAMICIN <=1 SENSITIVE Sensitive     IMIPENEM <=0.25 SENSITIVE Sensitive     NITROFURANTOIN 64 INTERMEDIATE Intermediate     TRIMETH/SULFA <=20 SENSITIVE Sensitive     * >=100,000 COLONIES/mL CITROBACTER AMALONATICUS   Enterococcus faecium - MIC*    AMPICILLIN >=32 RESISTANT Resistant     NITROFURANTOIN 64 INTERMEDIATE Intermediate     VANCOMYCIN Value in next row Resistant      >=32 RESISTANTCONFIRMED BY VANCOMYCIN SCREEN AGAR    CIPROFLOXACIN Value in next row Resistant      RESISTANT>=8    LEVOFLOXACIN Value in next row Resistant      RESISTANT>=8    TETRACYCLINE Value in next row Resistant      RESISTANT>=16    GENTAMICIN SYNERGY Value in next row Sensitive      RESISTANT>=16    * >=100,000 COLONIES/mL ENTEROCOCCUS FAECIUM  Wound culture     Status: None (Preliminary result)   Collection Time: 07/04/15 10:10 AM  Result Value Ref Range Status   Specimen Description BUTTOCKS  Final   Special Requests NONE  Final   Gram Stain   Final    FEW WBC SEEN FEW GRAM NEGATIVE RODS RARE GRAM VARIABLE ROD RARE GRAM POSITIVE COCCI IN PAIRS    Culture HOLDING FOR POSSIBLE PATHOGEN  Final   Report  Status PENDING  Incomplete  Culture, blood (routine x 2)     Status: None (Preliminary result)   Collection Time: 07/04/15 10:32 AM  Result Value Ref Range Status   Specimen Description BLOOD  Final   Special Requests Normal AEROBIC 7MP, ANAEROBIC 7ML  Final   Culture NO GROWTH 3 DAYS  Final   Report Status PENDING  Incomplete  Culture, blood (routine x 2)     Status: None (Preliminary result)   Collection Time: 07/04/15 10:45 AM  Result Value Ref Range Status   Specimen Description BLOOD  Final   Special Requests Normal AEROBIC 3ML, ANAEROBIC 1ML  Final  Culture NO GROWTH 3 DAYS  Final   Report Status PENDING  Incomplete  MRSA PCR Screening     Status: None   Collection Time: 07/04/15  5:26 PM  Result Value Ref Range Status   MRSA by PCR NEGATIVE NEGATIVE Final    Comment:        The GeneXpert MRSA Assay (FDA approved for NASAL specimens only), is one component of a comprehensive MRSA colonization surveillance program. It is not intended to diagnose MRSA infection nor to guide or monitor treatment for MRSA infections.   Urine culture     Status: None   Collection Time: 07/04/15  7:52 PM  Result Value Ref Range Status   Specimen Description URINE, CATHETERIZED  Final   Special Requests NONE  Final   Culture   Final    MULTIPLE SPECIES PRESENT, SUGGEST RECOLLECTION IF CLINICALLY INDICATED   Report Status 07/06/2015 FINAL  Final  Culture, blood (routine x 2)     Status: None (Preliminary result)   Collection Time: 07/06/15  3:25 PM  Result Value Ref Range Status   Specimen Description BLOOD RIGHT HAND  Final   Special Requests BOTTLES DRAWN AEROBIC ONLY 10CC  Final   Culture NO GROWTH < 24 HOURS  Final   Report Status PENDING  Incomplete  Culture, blood (routine x 2)     Status: None (Preliminary result)   Collection Time: 07/06/15  3:30 PM  Result Value Ref Range Status   Specimen Description BLOOD LEFT ANTECUBITAL  Final   Special Requests   Final    BOTTLES DRAWN  AEROBIC AND ANAEROBIC 10CC AER 5CC ANA   Culture NO GROWTH < 24 HOURS  Final   Report Status PENDING  Incomplete    Medical History: Past Medical History  Diagnosis Date  . Depressive disorder, not elsewhere classified   . Unspecified osteomyelitis, site unspecified   . Polymyalgia rheumatica   . Paralysis agitans   . Osteoarthrosis, unspecified whether generalized or localized, unspecified site   . Muscle weakness (generalized)   . Difficulty in walking(719.7)   . Parkinson disease   . PMR (polymyalgia rheumatica)   . Coronary atherosclerosis of unspecified type of vessel, native or graft     2007: LAD PCI with a BMS, 80% distal LCX stenosis treated medically. Most recent stress test in 04/2012 showed distal anterio/apical scar? with no ischemia, EF 53%.   [name] Unspecified essential hypertension   . Other and unspecified hyperlipidemia   . Thoracic ascending aortic aneurysm     4.0 cm  . Complication of anesthesia     2013- hallucinations- [facility]. (-spinal anesth. 2013- no problems   redo- hip- L)  . Esophageal reflux     pt. reports that its resolved   . Neuromuscular disorder     parkinson's - [facility] - neurologist   . Cancer     left breast cancer- lumpectomy, chemo, radiation, tamoxifen , SLNBx  . Anemia, unspecified     bld. transfusion- 02/2013    Medications:  Scheduled:  . antiseptic oral rinse  7 mL Mouth Rinse BID  . ARIPiprazole  5 mg Oral BID  . aspirin  81 mg Oral Daily  . atorvastatin  20 mg Oral QHS  . carbidopa-levodopa  2 tablet Oral QID  . Carbidopa-Levodopa ER  2 tablet Oral q1800   And  . entacapone  200 mg Oral q1800  . cholecalciferol  2,000 Units Oral Daily  . clindamycin  300  mg Oral BID  . collagenase   Topical Daily  . docusate sodium  100 mg Oral BID  . feeding supplement (ENSURE ENLIVE)  237 mL Oral TID BM  . ferrous [id number] C-folic acid  1 capsule Oral BID  . imipenem-cilastatin  250 mg Intravenous 4  times per day  . linezolid  600 mg Intravenous Q12H  . mirtazapine  15 mg Oral QHS  . QUEtiapine  150 mg Oral QHS  . rifampin  300 mg Oral BID  . rOPINIRole  2 mg Oral TID  . venlafaxine XR  150 mg Oral BID   Infusions:  . sodium chloride 75 mL/hr at 07/07/15 [place] PharmD., BCPS Clinical Pharmacist Pager [phone] 07/07/2015 5:11 PM

## 2015-07-07 NOTE — Progress Notes (Signed)
[facility] PROGRESS NOTE  Sabrina Duncan XNT:[id number] DOB: 04-25-1941 DOA: 07/04/2015 PCP: Idelle Crouch, MD  Assessment/Plan: Fever/sepsis -Believe at this point most likely related to UTI. -Chest x-ray done today for PICC line placement also with an infiltrate of her left base, consider pneumonia.  UTI  -Culture done on 7/11 with Citrobacter and VRE. -Currently remains on Rocephin of which has 4 days remaining and started on linezolid on 7/15 which she will need to continue for 7 days. -This is certainly complicated by potential rectovaginal fistula noted by RN today.  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 -This could certainly explain her UTI. -After discussion with family, we agree that she is much too weak to undergo repair of this fistula, they do not want CT scans.  Hospital-acquired pneumonia -Given sepsis and decreased blood pressure, will elect to treat infiltrate seen on CXR. -She already has good gram-positive coverage with linezolid, will add imipenem which should also cover her Citrobacter in urine and discontinue Rocephin.  Multiple decubiti ulcers -Air overlay mattress, appreciate wound care recommendations.  Chronic right hip wound -Has had multiple surgeries after her initial right hip placement many years ago and is healing by secondary intention, continue wound VAC, appreciate wound care and Dr. Trevor Mace recommendations. -Is on lifelong antibiotics with clindamycin and rifampin.  Severe protein caloric malnutrition  Major depression with psychotic features -Was seen by psychiatry at [facility], recommendations were given for Abilify, Remeron, Seroquel.  Sinus tachycardia -Rate somewhat increased today to the 120s with IV fluids. -We'll continue to treat with IV fluids as I believe she is volume depleted given her ongoing tachycardia and hypotension.  Code Status: DO NOT RESUSCITATE Family Communication: Conversation  today with patient's 2 sons Legrand Como and Gerald Stabs, have explained her poor prognosis and multiple medical comorbidities. Have explained that she is critically ill with a high probability of decompensation. We have agreed that it is in patient's best interest to become a DO NOT RESUSCITATE. We will be somewhat limited in our interventions. They have agreed to continue fluids and appropriate antibiotics and reassess her status in 48 hours.  Disposition Plan: To be determined, keep the stepdown unit   Consultants:  None   Antibiotics: -Linezolid -Imipenem   Subjective: Less interactive today, decreased oral intake, not complaining of any discomfort, per RN, stool noted in her vaginal area.  Objective: Filed Vitals:   07/07/15 1149 07/07/15 1215 07/07/15 1315 07/07/15 1400  BP: 84/43 78/41 84/41  84/45  Pulse: 115     Temp: 98.9 F (37.2 C)     TempSrc: Axillary     Resp: 17     SpO2: 95%       Intake/Output Summary (Last 24 hours) at 07/07/15 1530 Last data filed at 07/07/15 1400  Gross per 24 hour  Intake 3048.75 ml  Output    950 ml  Net 2098.75 ml   There were no vitals filed for this visit.  Exam:   General:  More somnolent today, still restless with tremors  Cardiovascular: Tachycardic, regular  Respiratory: Increased crackles more prominent at the left base  Abdomen: Soft, nontender, nondistended, positive bowel sounds  Extremities: No clubbing, cyanosis or edema, positive pulses   Neurologic:  Moves all 4 spontaneously  Data Reviewed: Basic Metabolic Panel:  Recent Labs Lab 07/03/15 0537 07/04/15 0453 07/05/15 0245  NA 140 136 134*  K 4.4 3.8 3.8  CL 107 105 101  CO2 26 28 26   GLUCOSE 109* 119* 118*  BUN 18 15 11   CREATININE 0.62 0.61 0.55  CALCIUM 8.1* 7.9* 8.0*   Liver Function Tests: No results for input(s): AST, ALT, ALKPHOS, BILITOT, PROT, ALBUMIN in the last 168 hours. No results for input(s): LIPASE, AMYLASE in the last 168 hours. No  results for input(s): AMMONIA in the last 168 hours. CBC:  Recent Labs Lab 07/03/15 0743 07/04/15 0453 07/05/15 0245 07/06/15 1525  WBC 6.0 3.1* 3.8* 6.1  HGB 9.2* 8.9* 8.7* 8.6*  HCT 28.4* 26.3* 27.5* 28.1*  MCV 88.3 89.1 91.4 90.4  PLT 431 379 372 386   Cardiac Enzymes:  Recent Labs Lab 07/06/15 1525 07/06/15 2035 07/07/15 0253  TROPONINI 1.08* 0.85* 0.73*   BNP (last 3 results) No results for input(s): BNP in the last 8760 hours.  ProBNP (last 3 results) No results for input(s): PROBNP in the last 8760 hours.  CBG:  Recent Labs Lab 07/04/15 0401  GLUCAP 121*    Recent Results (from the past 240 hour(s))  Urine culture     Status: None   Collection Time: 07/02/15 10:41 PM  Result Value Ref Range Status   Specimen Description URINE, CLEAN CATCH  Final   Special Requests clindamycin, metronidazole, rifampin  Final   Culture   Final    >=100,000 COLONIES/mL CITROBACTER AMALONATICUS >=100,000 COLONIES/mL ENTEROCOCCUS FAECIUM CRITICAL RESULT CALLED TO, READ BACK BY AND VERIFIED WITH: JOANNA LINDSAY MC3S ON 07/06/15 AT 0805 BY JEF VRE HAVE INTRINSIC RESISTANCE TO MOST COMMONLY USED ANTIBIOTICS AND THE ABILITY TO ACQUIRE RESISTANCE TO MOST AVAILABLE ANTIBIOTICS.    Report Status 07/06/2015 FINAL  Final   Organism ID, Bacteria CITROBACTER AMALONATICUS  Final   Organism ID, Bacteria ENTEROCOCCUS FAECIUM  Final      Susceptibility   Citrobacter amalonaticus - MIC*    CEFAZOLIN 32 RESISTANT Resistant     CEFTRIAXONE <=1 SENSITIVE Sensitive     CIPROFLOXACIN 2 INTERMEDIATE Intermediate     GENTAMICIN <=1 SENSITIVE Sensitive     IMIPENEM <=0.25 SENSITIVE Sensitive     NITROFURANTOIN 64 INTERMEDIATE Intermediate     TRIMETH/SULFA <=20 SENSITIVE Sensitive     * >=100,000 COLONIES/mL CITROBACTER AMALONATICUS   Enterococcus faecium - MIC*    AMPICILLIN >=32 RESISTANT Resistant     NITROFURANTOIN 64 INTERMEDIATE Intermediate     VANCOMYCIN Value in next row Resistant       >=32 RESISTANTCONFIRMED BY VANCOMYCIN SCREEN AGAR    CIPROFLOXACIN Value in next row Resistant      RESISTANT>=8    LEVOFLOXACIN Value in next row Resistant      RESISTANT>=8    TETRACYCLINE Value in next row Resistant      RESISTANT>=16    GENTAMICIN SYNERGY Value in next row Sensitive      RESISTANT>=16    * >=100,000 COLONIES/mL ENTEROCOCCUS FAECIUM  Wound culture     Status: None (Preliminary result)   Collection Time: 07/04/15 10:10 AM  Result Value Ref Range Status   Specimen Description BUTTOCKS  Final   Special Requests NONE  Final   Gram Stain   Final    FEW WBC SEEN FEW GRAM NEGATIVE RODS RARE GRAM VARIABLE ROD RARE GRAM POSITIVE COCCI IN PAIRS    Culture HOLDING FOR POSSIBLE PATHOGEN  Final   Report Status PENDING  Incomplete  Culture, blood (routine x 2)     Status: None (Preliminary result)   Collection Time: 07/04/15 10:32 AM  Result Value Ref Range Status   Specimen Description BLOOD  Final   Special Requests  Normal AEROBIC 7MP, ANAEROBIC 7ML  Final   Culture NO GROWTH 3 DAYS  Final   Report Status PENDING  Incomplete  Culture, blood (routine x 2)     Status: None (Preliminary result)   Collection Time: 07/04/15 10:45 AM  Result Value Ref Range Status   Specimen Description BLOOD  Final   Special Requests Normal AEROBIC 3ML, ANAEROBIC 1ML  Final   Culture NO GROWTH 3 DAYS  Final   Report Status PENDING  Incomplete  MRSA PCR Screening     Status: None   Collection Time: 07/04/15  5:26 PM  Result Value Ref Range Status   MRSA by PCR NEGATIVE NEGATIVE Final    Comment:        The GeneXpert MRSA Assay (FDA approved for NASAL specimens only), is one component of a comprehensive MRSA colonization surveillance program. It is not intended to diagnose MRSA infection nor to guide or monitor treatment for MRSA infections.   Urine culture     Status: None   Collection Time: 07/04/15  7:52 PM  Result Value Ref Range Status   Specimen Description URINE,  CATHETERIZED  Final   Special Requests NONE  Final   Culture   Final    MULTIPLE SPECIES PRESENT, SUGGEST RECOLLECTION IF CLINICALLY INDICATED   Report Status 07/06/2015 FINAL  Final  Culture, blood (routine x 2)     Status: None (Preliminary result)   Collection Time: 07/06/15  3:25 PM  Result Value Ref Range Status   Specimen Description BLOOD RIGHT HAND  Final   Special Requests BOTTLES DRAWN AEROBIC ONLY 10CC  Final   Culture NO GROWTH < 24 HOURS  Final   Report Status PENDING  Incomplete  Culture, blood (routine x 2)     Status: None (Preliminary result)   Collection Time: 07/06/15  3:30 PM  Result Value Ref Range Status   Specimen Description BLOOD LEFT ANTECUBITAL  Final   Special Requests   Final    BOTTLES DRAWN AEROBIC AND ANAEROBIC 10CC AER 5CC ANA   Culture NO GROWTH < 24 HOURS  Final   Report Status PENDING  Incomplete     Studies: Dg Chest Port 1 View  07/07/2015   CLINICAL DATA:  Confirm line placement. History of cancer and collagen vascular disease, hypertension.  EXAM: PORTABLE CHEST - 1 VIEW  COMPARISON:  07/06/2015  FINDINGS: Right-sided PICC line tip overlies the level of the superior vena cava. No pneumothorax. Patient is rotated. Heart is enlarged. There is dense opacity at the left lung base, consistent with atelectasis or infiltrate. Opacity appears increased since the previous exams. Left axillary surgical clips. Previous right shoulder arthroplasty.  IMPRESSION: 1. Right-sided PICC line tip to the superior vena cava. 2. Cardiomegaly. 3. Increasing density in the left lung base.   Electronically Signed   By: Nolon Nations M.D.   On: 07/07/2015 12:12   Dg Chest Port 1 View  07/06/2015   CLINICAL DATA:  Shortness of Breath  EXAM: PORTABLE CHEST - 1 VIEW  COMPARISON:  July 02, 2015  FINDINGS: Patient remains rotated. No edema or consolidation is appreciable. Heart is mildly enlarged with pulmonary vascularity within normal limits. No adenopathy. There is  atherosclerotic change in aorta, stable. There is a total shoulder replacement on the right. There is evidence of an old fracture of the left proximal humeral metaphysis, stable. There are surgical clips in left axillary region.  IMPRESSION: Stable cardiac prominence. No edema or consolidation. No appreciable change compared  to recent prior study.   Electronically Signed   By: Lowella Grip III M.D.   On: 07/06/2015 14:45    Scheduled Meds: . antiseptic oral rinse  7 mL Mouth Rinse BID  . ARIPiprazole  5 mg Oral BID  . aspirin  81 mg Oral Daily  . atorvastatin  20 mg Oral QHS  . carbidopa-levodopa  2 tablet Oral QID  . Carbidopa-Levodopa ER  2 tablet Oral q1800   And  . entacapone  200 mg Oral q1800  . cefTRIAXone (ROCEPHIN)  IV  1 g Intravenous Q24H  . cholecalciferol  2,000 Units Oral Daily  . clindamycin  300 mg Oral BID  . collagenase   Topical Daily  . docusate sodium  100 mg Oral BID  . feeding supplement (ENSURE ENLIVE)  237 mL Oral TID BM  . ferrous [id number] C-folic acid  1 capsule Oral BID  . linezolid  600 mg Intravenous Q12H  . mirtazapine  15 mg Oral QHS  . QUEtiapine  150 mg Oral QHS  . rifampin  300 mg Oral BID  . rOPINIRole  2 mg Oral TID  . venlafaxine XR  150 mg Oral BID   Continuous Infusions: . sodium chloride 75 mL/hr at 07/07/15 1348    Active Problems:   Essential hypertension   Acute blood loss anemia   Hematoma   Protein-calorie malnutrition, severe   Delayed surgical wound healing   Severe recurrent major depression with psychotic features   UTI (urinary tract infection)   Fever   Seizures   UTI (lower urinary tract infection)    Time spent: 55 minutes. Greater than 50% of this time was spent in direct contact with the patient coordinating care.    Lelon Frohlich  [facility] Pager [phone]  If 7PM-7AM, please contact night-coverage at [email or web address], password [facility] 07/07/2015, 3:30 PM  LOS: 3 days

## 2015-07-07 NOTE — Progress Notes (Signed)
Peripherally Inserted Central Catheter/Midline Placement  The IV Nurse has discussed with the patient and/or persons authorized to consent for the patient, the purpose of this procedure and the potential benefits and risks involved with this procedure.  The benefits include less needle sticks, lab draws from the catheter and patient may be discharged home with the catheter.  Risks include, but not limited to, infection, bleeding, blood clot (thrombus formation), and puncture of an artery; nerve damage and irregular heat beat.  Alternatives to this procedure were also discussed.  Consent obtain from daughter.  PICC/Midline Placement Documentation  PICC / Midline Double Lumen 48/88/91 PICC Right Basilic 38 cm 0 cm (Active)  Indication for Insertion or Continuance of Line Poor Vasculature-patient has had multiple peripheral attempts or PIVs lasting less than 24 hours 07/07/2015 11:43 AM  Exposed Catheter (cm) 0 cm 07/07/2015 11:43 AM  Site Assessment Clean;Dry;Intact 07/07/2015 11:43 AM  Lumen #1 Status Flushed;Saline locked;Blood return noted 07/07/2015 11:43 AM  Lumen #2 Status Flushed;Saline locked;Blood return noted 07/07/2015 11:43 AM  Dressing Type Transparent 07/07/2015 11:43 AM  Dressing Change Due 07/14/15 07/07/2015 11:43 AM       Gordan Payment 07/07/2015, 11:44 AM

## 2015-07-08 ENCOUNTER — Other Ambulatory Visit (HOSPITAL_COMMUNITY): Payer: Medicare Other

## 2015-07-08 DIAGNOSIS — A419 Sepsis, unspecified organism: Secondary | ICD-10-CM

## 2015-07-08 DIAGNOSIS — R6520 Severe sepsis without septic shock: Secondary | ICD-10-CM

## 2015-07-08 LAB — WOUND CULTURE

## 2015-07-08 LAB — PROCALCITONIN: Procalcitonin: 0.29 ng/mL

## 2015-07-08 MED ORDER — MORPHINE SULFATE 2 MG/ML IJ SOLN
INTRAMUSCULAR | Status: AC
  Administered 2015-07-08: 1 mg via INTRAVENOUS
  Filled 2015-07-08: qty 1

## 2015-07-08 MED ORDER — MORPHINE SULFATE 2 MG/ML IJ SOLN
1.0000 mg | INTRAMUSCULAR | Status: DC | PRN
Start: 2015-07-08 — End: 2015-07-09
  Administered 2015-07-08 – 2015-07-09 (×4): 1 mg via INTRAVENOUS
  Filled 2015-07-08 (×4): qty 1

## 2015-07-08 MED ORDER — MORPHINE SULFATE 2 MG/ML IJ SOLN
1.0000 mg | INTRAMUSCULAR | Status: DC | PRN
  Administered 2015-07-08 (×3): 1 mg via INTRAVENOUS
  Filled 2015-07-08 (×2): qty 1

## 2015-07-08 NOTE — Progress Notes (Signed)
[facility] PROGRESS NOTE  Sabrina Duncan JKK:[id number] DOB: January 21, 1941 DOA: 07/04/2015 PCP: Idelle Crouch, MD  Assessment/Plan: Fever/sepsis -Believe at this point most likely related to UTI/possibly HCAP/chronic right hip wound. -See below for details.  UTI  -Culture done on 7/11 with Citrobacter and VRE. -Continue imipenem/linezolid. -This is certainly complicated by potential rectovaginal fistula noted by RN.  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 -This could certainly explain her UTI. -After discussion with family, we agree that she is much too weak to undergo repair of this fistula, they do not want CT scans.  Hospital-acquired pneumonia -Given sepsis and decreased blood pressure, will elect to treat infiltrate seen on CXR. -Continue imipenem/linezolid.  Multiple decubiti ulcers -Air overlay mattress, appreciate wound care recommendations.  Chronic right hip wound -Has had multiple surgeries after her initial right hip placement many years ago and is healing by secondary intention, continue wound VAC, appreciate wound care and Dr. Trevor Mace recommendations. -Is on lifelong antibiotics with clindamycin and rifampin. -Noted to have significant purulent drainage from right hip overnight.  Severe protein caloric malnutrition  Major depression with psychotic features -Was seen by psychiatry at [facility], recommendations were given for Abilify, Remeron, Seroquel.  Sinus tachycardia -Rate somewhat increased today to the 120s with IV fluids. -We'll continue to treat with IV fluids as I believe she is volume depleted given her ongoing tachycardia, hypotension and sepsis.  Code Status: DO NOT RESUSCITATE Family Communication: Conversation today with patient's 2 sons Legrand Como and Gerald Stabs on 7/16, have explained her poor prognosis and multiple medical comorbidities. Have explained that she is critically ill with a high probability of decompensation. We  have agreed that it is in patient's best interest to become a DO NOT RESUSCITATE. We will be somewhat limited in our interventions. They have agreed to continue fluids and appropriate antibiotics and reassess her status in 48 hours. Today 7/17, family is requesting a palliative care evaluation with is very appropriate given current condition. Will request. Disposition Plan: To be determined, keep the stepdown unit   Consultants:  None   Antibiotics: -Linezolid -Imipenem   Subjective: Withdrawn, not interactive, mumbles nonsensically.  Objective: Filed Vitals:   07/08/15 0600 07/08/15 0700 07/08/15 0800 07/08/15 0817  BP: 96/53 110/55 102/53   Pulse: 126 126 116   Temp:    99.1 F (37.3 C)  TempSrc:    Axillary  Resp: 24 15 26    SpO2: 94% 96% 94%     Intake/Output Summary (Last 24 hours) at 07/08/15 0950 Last data filed at 07/08/15 0818  Gross per 24 hour  Intake   1505 ml  Output   1000 ml  Net    505 ml   There were no vitals filed for this visit.  Exam:   General:  More somnolent today, still restless with tremors  Cardiovascular: Tachycardic, regular  Respiratory: Increased crackles more prominent at the left base  Abdomen: Soft, nontender, nondistended, positive bowel sounds  Extremities: No clubbing, cyanosis or edema, positive pulses   Neurologic:  Moves all 4 spontaneously  Data Reviewed: Basic Metabolic Panel:  Recent Labs Lab 07/03/15 0537 07/04/15 0453 07/05/15 0245  NA 140 136 134*  K 4.4 3.8 3.8  CL 107 105 101  CO2 26 28 26   GLUCOSE 109* 119* 118*  BUN 18 15 11   CREATININE 0.62 0.61 0.55  CALCIUM 8.1* 7.9* 8.0*   Liver Function Tests: No results for input(s): AST, ALT, ALKPHOS, BILITOT, PROT, ALBUMIN in the last 168 hours. No  results for input(s): LIPASE, AMYLASE in the last 168 hours. No results for input(s): AMMONIA in the last 168 hours. CBC:  Recent Labs Lab 07/03/15 0743 07/04/15 0453 07/05/15 0245 07/06/15 1525  WBC  6.0 3.1* 3.8* 6.1  HGB 9.2* 8.9* 8.7* 8.6*  HCT 28.4* 26.3* 27.5* 28.1*  MCV 88.3 89.1 91.4 90.4  PLT 431 379 372 386   Cardiac Enzymes:  Recent Labs Lab 07/06/15 1525 07/06/15 2035 07/07/15 0253  TROPONINI 1.08* 0.85* 0.73*   BNP (last 3 results) No results for input(s): BNP in the last 8760 hours.  ProBNP (last 3 results) No results for input(s): PROBNP in the last 8760 hours.  CBG:  Recent Labs Lab 07/04/15 0401  GLUCAP 121*    Recent Results (from the past 240 hour(s))  Urine culture     Status: None   Collection Time: 07/02/15 10:41 PM  Result Value Ref Range Status   Specimen Description URINE, CLEAN CATCH  Final   Special Requests clindamycin, metronidazole, rifampin  Final   Culture   Final    >=100,000 COLONIES/mL CITROBACTER AMALONATICUS >=100,000 COLONIES/mL ENTEROCOCCUS FAECIUM CRITICAL RESULT CALLED TO, READ BACK BY AND VERIFIED WITH: JOANNA LINDSAY MC3S ON 07/06/15 AT 0805 BY JEF VRE HAVE INTRINSIC RESISTANCE TO MOST COMMONLY USED ANTIBIOTICS AND THE ABILITY TO ACQUIRE RESISTANCE TO MOST AVAILABLE ANTIBIOTICS.    Report Status 07/06/2015 FINAL  Final   Organism ID, Bacteria CITROBACTER AMALONATICUS  Final   Organism ID, Bacteria ENTEROCOCCUS FAECIUM  Final      Susceptibility   Citrobacter amalonaticus - MIC*    CEFAZOLIN 32 RESISTANT Resistant     CEFTRIAXONE <=1 SENSITIVE Sensitive     CIPROFLOXACIN 2 INTERMEDIATE Intermediate     GENTAMICIN <=1 SENSITIVE Sensitive     IMIPENEM <=0.25 SENSITIVE Sensitive     NITROFURANTOIN 64 INTERMEDIATE Intermediate     TRIMETH/SULFA <=20 SENSITIVE Sensitive     * >=100,000 COLONIES/mL CITROBACTER AMALONATICUS   Enterococcus faecium - MIC*    AMPICILLIN >=32 RESISTANT Resistant     NITROFURANTOIN 64 INTERMEDIATE Intermediate     VANCOMYCIN Value in next row Resistant      >=32 RESISTANTCONFIRMED BY VANCOMYCIN SCREEN AGAR    CIPROFLOXACIN Value in next row Resistant      RESISTANT>=8    LEVOFLOXACIN Value  in next row Resistant      RESISTANT>=8    TETRACYCLINE Value in next row Resistant      RESISTANT>=16    GENTAMICIN SYNERGY Value in next row Sensitive      RESISTANT>=16    * >=100,000 COLONIES/mL ENTEROCOCCUS FAECIUM  Wound culture     Status: None   Collection Time: 07/04/15 10:10 AM  Result Value Ref Range Status   Specimen Description BUTTOCKS  Final   Special Requests NONE  Final   Gram Stain   Final    FEW WBC SEEN FEW GRAM NEGATIVE RODS RARE GRAM VARIABLE ROD RARE GRAM POSITIVE COCCI IN PAIRS    Culture   Final    MODERATE GROWTH CITROBACTER AMALONATICUS WITH NORMAL SKIN FLORA    Report Status 07/08/2015 FINAL  Final   Organism ID, Bacteria CITROBACTER AMALONATICUS  Final      Susceptibility   Citrobacter amalonaticus - MIC*    CEFTAZIDIME <=1 SENSITIVE Sensitive     CEFAZOLIN >=64 RESISTANT Resistant     CEFTRIAXONE <=1 SENSITIVE Sensitive     CIPROFLOXACIN 2 INTERMEDIATE Intermediate     GENTAMICIN <=1 SENSITIVE Sensitive     IMIPENEM <=  0.25 SENSITIVE Sensitive     TRIMETH/SULFA <=20 SENSITIVE Sensitive     NITROFURANTOIN Value in next row Intermediate      INTERMEDIATE64    PIP/TAZO Value in next row Sensitive      SENSITIVE8    ERTAPENEM Value in next row Sensitive      SENSITIVE<=0.5    LEVOFLOXACIN Value in next row Intermediate      INTERMEDIATE4    * MODERATE GROWTH CITROBACTER AMALONATICUS  Culture, blood (routine x 2)     Status: None (Preliminary result)   Collection Time: 07/04/15 10:32 AM  Result Value Ref Range Status   Specimen Description BLOOD  Final   Special Requests Normal AEROBIC 7MP, ANAEROBIC 7ML  Final   Culture NO GROWTH 3 DAYS  Final   Report Status PENDING  Incomplete  Culture, blood (routine x 2)     Status: None (Preliminary result)   Collection Time: 07/04/15 10:45 AM  Result Value Ref Range Status   Specimen Description BLOOD  Final   Special Requests Normal AEROBIC 3ML, ANAEROBIC 1ML  Final   Culture NO GROWTH 3 DAYS   Final   Report Status PENDING  Incomplete  MRSA PCR Screening     Status: None   Collection Time: 07/04/15  5:26 PM  Result Value Ref Range Status   MRSA by PCR NEGATIVE NEGATIVE Final    Comment:        The GeneXpert MRSA Assay (FDA approved for NASAL specimens only), is one component of a comprehensive MRSA colonization surveillance program. It is not intended to diagnose MRSA infection nor to guide or monitor treatment for MRSA infections.   Urine culture     Status: None   Collection Time: 07/04/15  7:52 PM  Result Value Ref Range Status   Specimen Description URINE, CATHETERIZED  Final   Special Requests NONE  Final   Culture   Final    MULTIPLE SPECIES PRESENT, SUGGEST RECOLLECTION IF CLINICALLY INDICATED   Report Status 07/06/2015 FINAL  Final  Culture, blood (routine x 2)     Status: None (Preliminary result)   Collection Time: 07/06/15  3:25 PM  Result Value Ref Range Status   Specimen Description BLOOD RIGHT HAND  Final   Special Requests BOTTLES DRAWN AEROBIC ONLY 10CC  Final   Culture NO GROWTH < 24 HOURS  Final   Report Status PENDING  Incomplete  Culture, blood (routine x 2)     Status: None (Preliminary result)   Collection Time: 07/06/15  3:30 PM  Result Value Ref Range Status   Specimen Description BLOOD LEFT ANTECUBITAL  Final   Special Requests   Final    BOTTLES DRAWN AEROBIC AND ANAEROBIC 10CC AER 5CC ANA   Culture NO GROWTH < 24 HOURS  Final   Report Status PENDING  Incomplete     Studies: Dg Chest Port 1 View  07/07/2015   CLINICAL DATA:  Confirm line placement. History of cancer and collagen vascular disease, hypertension.  EXAM: PORTABLE CHEST - 1 VIEW  COMPARISON:  07/06/2015  FINDINGS: Right-sided PICC line tip overlies the level of the superior vena cava. No pneumothorax. Patient is rotated. Heart is enlarged. There is dense opacity at the left lung base, consistent with atelectasis or infiltrate. Opacity appears increased since the previous  exams. Left axillary surgical clips. Previous right shoulder arthroplasty.  IMPRESSION: 1. Right-sided PICC line tip to the superior vena cava. 2. Cardiomegaly. 3. Increasing density in the left lung base.  Electronically Signed   By: Nolon Nations M.D.   On: 07/07/2015 12:12   Dg Chest Port 1 View  07/06/2015   CLINICAL DATA:  Shortness of Breath  EXAM: PORTABLE CHEST - 1 VIEW  COMPARISON:  July 02, 2015  FINDINGS: Patient remains rotated. No edema or consolidation is appreciable. Heart is mildly enlarged with pulmonary vascularity within normal limits. No adenopathy. There is atherosclerotic change in aorta, stable. There is a total shoulder replacement on the right. There is evidence of an old fracture of the left proximal humeral metaphysis, stable. There are surgical clips in left axillary region.  IMPRESSION: Stable cardiac prominence. No edema or consolidation. No appreciable change compared to recent prior study.   Electronically Signed   By: Lowella Grip III M.D.   On: 07/06/2015 14:45    Scheduled Meds: . antiseptic oral rinse  7 mL Mouth Rinse BID  . ARIPiprazole  5 mg Oral BID  . aspirin  81 mg Oral Daily  . atorvastatin  20 mg Oral QHS  . carbidopa-levodopa  2 tablet Oral QID  . Carbidopa-Levodopa ER  2 tablet Oral q1800   And  . entacapone  200 mg Oral q1800  . cholecalciferol  2,000 Units Oral Daily  . clindamycin  300 mg Oral BID  . collagenase   Topical Daily  . docusate sodium  100 mg Oral BID  . feeding supplement (ENSURE ENLIVE)  237 mL Oral TID BM  . ferrous [id number] C-folic acid  1 capsule Oral BID  . imipenem-cilastatin  250 mg Intravenous 4 times per day  . linezolid  600 mg Intravenous Q12H  . mirtazapine  15 mg Oral QHS  . QUEtiapine  150 mg Oral QHS  . rifampin  300 mg Oral BID  . rOPINIRole  2 mg Oral TID  . venlafaxine XR  150 mg Oral BID   Continuous Infusions: . sodium chloride 75 mL/hr at 07/07/15 1348    Active Problems:    Essential hypertension   Acute blood loss anemia   Hematoma   Protein-calorie malnutrition, severe   Delayed surgical wound healing   Severe recurrent major depression with psychotic features   UTI (urinary tract infection)   Fever   Seizures   UTI (lower urinary tract infection)    Time spent: 25 minutes. Greater than 50% of this time was spent in direct contact with the patient coordinating care.    Lelon Frohlich  [facility] Pager [phone]  If 7PM-7AM, please contact night-coverage at [email or web address], password [facility] 07/08/2015, 9:50 AM  LOS: 4 days

## 2015-07-08 NOTE — Progress Notes (Signed)
Pt found with increased labored respirations in decrease in mentation. Pt is moaning and has increase in tremors. Np notified and increased frequency of morphine. Morphine given per orders. No change in pt status. Will continue to monitor pt. Attempted to notify family of change Will continue to monitor.

## 2015-07-08 NOTE — Progress Notes (Signed)
0410 The wound vac was alarming with a  partial blockage. Canister changed. Will continue to monitor pt.  0500 Wound Vac alarming with a blockage. Dressing changed. When the black form was removed there was a copious amount of purulent drainage that drained from wound opening. Wound irrigated with sterile water and dressing changed per protocol and Md's orders. Will continue to monitor pt.

## 2015-07-09 DIAGNOSIS — R6520 Severe sepsis without septic shock: Secondary | ICD-10-CM

## 2015-07-09 DIAGNOSIS — A419 Sepsis, unspecified organism: Principal | ICD-10-CM

## 2015-07-09 DIAGNOSIS — N390 Urinary tract infection, site not specified: Secondary | ICD-10-CM

## 2015-07-09 DIAGNOSIS — R52 Pain, unspecified: Secondary | ICD-10-CM

## 2015-07-09 DIAGNOSIS — Z515 Encounter for palliative care: Secondary | ICD-10-CM

## 2015-07-09 DIAGNOSIS — R451 Restlessness and agitation: Secondary | ICD-10-CM

## 2015-07-09 LAB — CULTURE, BLOOD (ROUTINE X 2)
CULTURE: NO GROWTH
Culture: NO GROWTH
SPECIAL REQUESTS: NORMAL
SPECIAL REQUESTS: NORMAL

## 2015-07-09 MED ORDER — LORAZEPAM 2 MG/ML IJ SOLN
1.0000 mg | INTRAMUSCULAR | Status: DC | PRN
  Administered 2015-07-09 – 2015-07-10 (×5): 1 mg via INTRAVENOUS
  Filled 2015-07-09 (×5): qty 1

## 2015-07-09 MED ORDER — MORPHINE SULFATE 2 MG/ML IJ SOLN
1.0000 mg | INTRAMUSCULAR | Status: DC | PRN
  Administered 2015-07-09 – 2015-07-10 (×5): 1 mg via INTRAVENOUS
  Filled 2015-07-09 (×5): qty 1

## 2015-07-09 MED ORDER — BISACODYL 10 MG RE SUPP: 10.0000 mg | Freq: Every day | RECTAL | Status: DC | PRN

## 2015-07-09 NOTE — Care Management Important Message (Addendum)
    Patient Details  Name: Sabrina Duncan MRN: [id number] Date of Birth: July 18, 1941   Medicare Important Message Given:   07/09/15    Whitman Hero [place], [place] [phone] 07/09/2015, 10:47 AM

## 2015-07-09 NOTE — Progress Notes (Signed)
[facility] PROGRESS NOTE  Sabrina Duncan IRJ:[id number] DOB: 04-13-1941 DOA: 07/04/2015 PCP: Idelle Crouch, MD  Assessment/Plan: Fever/sepsis -Believe at this point most likely related to UTI/possibly HCAP/chronic right hip wound. -See below for details.  UTI  -Culture done on 7/11 with Citrobacter and VRE. -Continue imipenem/linezolid. -This is certainly complicated by potential rectovaginal fistula noted by RN.  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 -This could certainly explain her UTI. -After discussion with family, we agree that she is much too weak to undergo repair of this fistula, they do not want CT scans.  Hospital-acquired pneumonia -Given sepsis and decreased blood pressure, will elect to treat infiltrate seen on CXR. -Continue imipenem/linezolid.  Multiple decubiti ulcers -Air overlay mattress, appreciate wound care recommendations.  Chronic right hip wound -Has had multiple surgeries after her initial right hip placement many years ago and is healing by secondary intention, continue wound VAC, appreciate wound care and Dr. Trevor Mace recommendations. -Is on lifelong antibiotics with clindamycin and rifampin. -Noted to have significant purulent drainage from right hip overnight.  Severe protein caloric malnutrition  Major depression with psychotic features -Was seen by psychiatry at [facility], recommendations were given for Abilify, Remeron, Seroquel.  Sinus tachycardia -Rate improved to 100-110s. -We'll continue to treat with IV fluids as I believe she is volume depleted given her ongoing tachycardia, hypotension and sepsis.  Code Status: DO NOT RESUSCITATE Family Communication:  Remains extremely frail. Has a palliative care consultation at 1 pm today. I believe goals should be directed towards comfort. Today marks the 48 hrs I told the sons we would give her to see if she responded to fluids and antibiotics. She in fact seems  worse, is less responsive and moans in pain intermittently. Discussed with DIL Vicky Merchel via phone today. Disposition Plan: To be determined, keep the stepdown unit   Consultants:  None   Antibiotics: -Linezolid -Imipenem   Subjective: Moaning in pain, not very responsive today.  Objective: Filed Vitals:   07/09/15 0200 07/09/15 0429 07/09/15 0600 07/09/15 0815  BP: 99/52  92/48 90/43  Pulse: 107  98 101  Temp:  98.9 F (37.2 C)  98.4 F (36.9 C)  TempSrc:  Axillary  Axillary  Resp: 20  19 19   Height:      Weight:      SpO2: 98%  98% 100%    Intake/Output Summary (Last 24 hours) at 07/09/15 0833 Last data filed at 07/09/15 0819  Gross per 24 hour  Intake   2875 ml  Output    625 ml  Net   2250 ml   Filed Weights   07/08/15 1928  Weight: 52.935 kg (116 lb 11.2 oz)    Exam:   General:  More somnolent today, still restless with tremors  Cardiovascular: Tachycardic, regular  Respiratory: Increased crackles more prominent at the left base  Abdomen: Soft, nontender, nondistended, positive bowel sounds  Extremities: No clubbing, cyanosis or edema, positive pulses   Neurologic:  Moves all 4 spontaneously  Data Reviewed: Basic Metabolic Panel:  Recent Labs Lab 07/03/15 0537 07/04/15 0453 07/05/15 0245  NA 140 136 134*  K 4.4 3.8 3.8  CL 107 105 101  CO2 26 28 26   GLUCOSE 109* 119* 118*  BUN 18 15 11   CREATININE 0.62 0.61 0.55  CALCIUM 8.1* 7.9* 8.0*   Liver Function Tests: No results for input(s): AST, ALT, ALKPHOS, BILITOT, PROT, ALBUMIN in the last 168 hours. No results for input(s): LIPASE, AMYLASE in the last  168 hours. No results for input(s): AMMONIA in the last 168 hours. CBC:  Recent Labs Lab 07/03/15 0743 07/04/15 0453 07/05/15 0245 07/06/15 1525  WBC 6.0 3.1* 3.8* 6.1  HGB 9.2* 8.9* 8.7* 8.6*  HCT 28.4* 26.3* 27.5* 28.1*  MCV 88.3 89.1 91.4 90.4  PLT 431 379 372 386   Cardiac Enzymes:  Recent Labs Lab 07/06/15 1525  07/06/15 2035 07/07/15 0253  TROPONINI 1.08* 0.85* 0.73*   BNP (last 3 results) No results for input(s): BNP in the last 8760 hours.  ProBNP (last 3 results) No results for input(s): PROBNP in the last 8760 hours.  CBG:  Recent Labs Lab 07/04/15 0401  GLUCAP 121*    Recent Results (from the past 240 hour(s))  Urine culture     Status: None   Collection Time: 07/02/15 10:41 PM  Result Value Ref Range Status   Specimen Description URINE, CLEAN CATCH  Final   Special Requests clindamycin, metronidazole, rifampin  Final   Culture   Final    >=100,000 COLONIES/mL CITROBACTER AMALONATICUS >=100,000 COLONIES/mL ENTEROCOCCUS FAECIUM CRITICAL RESULT CALLED TO, READ BACK BY AND VERIFIED WITH: JOANNA LINDSAY MC3S ON 07/06/15 AT 0805 BY JEF VRE HAVE INTRINSIC RESISTANCE TO MOST COMMONLY USED ANTIBIOTICS AND THE ABILITY TO ACQUIRE RESISTANCE TO MOST AVAILABLE ANTIBIOTICS.    Report Status 07/06/2015 FINAL  Final   Organism ID, Bacteria CITROBACTER AMALONATICUS  Final   Organism ID, Bacteria ENTEROCOCCUS FAECIUM  Final      Susceptibility   Citrobacter amalonaticus - MIC*    CEFAZOLIN 32 RESISTANT Resistant     CEFTRIAXONE <=1 SENSITIVE Sensitive     CIPROFLOXACIN 2 INTERMEDIATE Intermediate     GENTAMICIN <=1 SENSITIVE Sensitive     IMIPENEM <=0.25 SENSITIVE Sensitive     NITROFURANTOIN 64 INTERMEDIATE Intermediate     TRIMETH/SULFA <=20 SENSITIVE Sensitive     * >=100,000 COLONIES/mL CITROBACTER AMALONATICUS   Enterococcus faecium - MIC*    AMPICILLIN >=32 RESISTANT Resistant     NITROFURANTOIN 64 INTERMEDIATE Intermediate     VANCOMYCIN Value in next row Resistant      >=32 RESISTANTCONFIRMED BY VANCOMYCIN SCREEN AGAR    CIPROFLOXACIN Value in next row Resistant      RESISTANT>=8    LEVOFLOXACIN Value in next row Resistant      RESISTANT>=8    TETRACYCLINE Value in next row Resistant      RESISTANT>=16    GENTAMICIN SYNERGY Value in next row Sensitive      RESISTANT>=16     * >=100,000 COLONIES/mL ENTEROCOCCUS FAECIUM  Wound culture     Status: None   Collection Time: 07/04/15 10:10 AM  Result Value Ref Range Status   Specimen Description BUTTOCKS  Final   Special Requests NONE  Final   Gram Stain   Final    FEW WBC SEEN FEW GRAM NEGATIVE RODS RARE GRAM VARIABLE ROD RARE GRAM POSITIVE COCCI IN PAIRS    Culture   Final    MODERATE GROWTH CITROBACTER AMALONATICUS WITH NORMAL SKIN FLORA    Report Status 07/08/2015 FINAL  Final   Organism ID, Bacteria CITROBACTER AMALONATICUS  Final      Susceptibility   Citrobacter amalonaticus - MIC*    CEFTAZIDIME <=1 SENSITIVE Sensitive     CEFAZOLIN >=64 RESISTANT Resistant     CEFTRIAXONE <=1 SENSITIVE Sensitive     CIPROFLOXACIN 2 INTERMEDIATE Intermediate     GENTAMICIN <=1 SENSITIVE Sensitive     IMIPENEM <=0.25 SENSITIVE Sensitive     TRIMETH/SULFA <=  20 SENSITIVE Sensitive     NITROFURANTOIN Value in next row Intermediate      INTERMEDIATE64    PIP/TAZO Value in next row Sensitive      SENSITIVE8    ERTAPENEM Value in next row Sensitive      SENSITIVE<=0.5    LEVOFLOXACIN Value in next row Intermediate      INTERMEDIATE4    * MODERATE GROWTH CITROBACTER AMALONATICUS  Culture, blood (routine x 2)     Status: None   Collection Time: 07/04/15 10:32 AM  Result Value Ref Range Status   Specimen Description BLOOD  Final   Special Requests Normal AEROBIC 7MP, ANAEROBIC 7ML  Final   Culture NO GROWTH 5 DAYS  Final   Report Status 07/09/2015 FINAL  Final  Culture, blood (routine x 2)     Status: None   Collection Time: 07/04/15 10:45 AM  Result Value Ref Range Status   Specimen Description BLOOD  Final   Special Requests Normal AEROBIC 3ML, ANAEROBIC 1ML  Final   Culture NO GROWTH 5 DAYS  Final   Report Status 07/09/2015 FINAL  Final  MRSA PCR Screening     Status: None   Collection Time: 07/04/15  5:26 PM  Result Value Ref Range Status   MRSA by PCR NEGATIVE NEGATIVE Final    Comment:        The  GeneXpert MRSA Assay (FDA approved for NASAL specimens only), is one component of a comprehensive MRSA colonization surveillance program. It is not intended to diagnose MRSA infection nor to guide or monitor treatment for MRSA infections.   Urine culture     Status: None   Collection Time: 07/04/15  7:52 PM  Result Value Ref Range Status   Specimen Description URINE, CATHETERIZED  Final   Special Requests NONE  Final   Culture   Final    MULTIPLE SPECIES PRESENT, SUGGEST RECOLLECTION IF CLINICALLY INDICATED   Report Status 07/06/2015 FINAL  Final  Culture, blood (routine x 2)     Status: None (Preliminary result)   Collection Time: 07/06/15  3:25 PM  Result Value Ref Range Status   Specimen Description BLOOD RIGHT HAND  Final   Special Requests BOTTLES DRAWN AEROBIC ONLY 10CC  Final   Culture NO GROWTH 2 DAYS  Final   Report Status PENDING  Incomplete  Culture, blood (routine x 2)     Status: None (Preliminary result)   Collection Time: 07/06/15  3:30 PM  Result Value Ref Range Status   Specimen Description BLOOD LEFT ANTECUBITAL  Final   Special Requests   Final    BOTTLES DRAWN AEROBIC AND ANAEROBIC 10CC AER 5CC ANA   Culture NO GROWTH 2 DAYS  Final   Report Status PENDING  Incomplete     Studies: Dg Chest Port 1 View  07/07/2015   CLINICAL DATA:  Confirm line placement. History of cancer and collagen vascular disease, hypertension.  EXAM: PORTABLE CHEST - 1 VIEW  COMPARISON:  07/06/2015  FINDINGS: Right-sided PICC line tip overlies the level of the superior vena cava. No pneumothorax. Patient is rotated. Heart is enlarged. There is dense opacity at the left lung base, consistent with atelectasis or infiltrate. Opacity appears increased since the previous exams. Left axillary surgical clips. Previous right shoulder arthroplasty.  IMPRESSION: 1. Right-sided PICC line tip to the superior vena cava. 2. Cardiomegaly. 3. Increasing density in the left lung base.   Electronically  Signed   By: Nolon Nations M.D.   On:  07/07/2015 12:12    Scheduled Meds: . antiseptic oral rinse  7 mL Mouth Rinse BID  . ARIPiprazole  5 mg Oral BID  . aspirin  81 mg Oral Daily  . atorvastatin  20 mg Oral QHS  . carbidopa-levodopa  2 tablet Oral QID  . Carbidopa-Levodopa ER  2 tablet Oral q1800   And  . entacapone  200 mg Oral q1800  . cholecalciferol  2,000 Units Oral Daily  . clindamycin  300 mg Oral BID  . collagenase   Topical Daily  . docusate sodium  100 mg Oral BID  . feeding supplement (ENSURE ENLIVE)  237 mL Oral TID BM  . ferrous [id number] C-folic acid  1 capsule Oral BID  . imipenem-cilastatin  250 mg Intravenous 4 times per day  . linezolid  600 mg Intravenous Q12H  . mirtazapine  15 mg Oral QHS  . QUEtiapine  150 mg Oral QHS  . rifampin  300 mg Oral BID  . rOPINIRole  2 mg Oral TID  . venlafaxine XR  150 mg Oral BID   Continuous Infusions: . sodium chloride 75 mL/hr at 07/09/15 4158    Active Problems:   Essential hypertension   Acute blood loss anemia   Hematoma   Protein-calorie malnutrition, severe   Delayed surgical wound healing   Severe recurrent major depression with psychotic features   UTI (urinary tract infection)   Fever   Seizures   UTI (lower urinary tract infection)   Severe sepsis    Time spent: 25 minutes. Greater than 50% of this time was spent in direct contact with the patient coordinating care.    Lelon Frohlich  [facility] Pager [phone]  If 7PM-7AM, please contact night-coverage at [email or web address], password [facility] 07/09/2015, 8:33 AM  LOS: 5 days

## 2015-07-09 NOTE — Clinical Social Work Note (Addendum)
Clinical Social Work Assessment  Patient Details  Name: Sabrina Duncan MRN: [id number] Date of Birth: 28-Mar-1941  Date of referral:  07/09/15               Reason for consult:  End of Life/Hospice                Permission sought to share information with:  Family Supports Permission granted to share information::  Yes, Verbal Permission Granted  Name::     Tresa Endo  Relationship::  son   Contact Information:  [phone]  Housing/Transportation Living arrangements for the past 2 months:  Home Source of Information:  Adult Children Patient Interpreter Needed:  None Criminal Activity/Legal Involvement Pertinent to Current Situation/Hospitalization:  No - Comment as needed Significant Relationships:  Adult Children Lives with:  Facility Resident Do you feel safe going back to the place where you live?  No Need for family participation in patient care:  Yes (Comment)  Care giving concerns: N/A   Facilities manager / plan: CSW spoke with the pt's son Gerald Stabs. CSW and Gerald Stabs discussed residential hospice. Gerald Stabs expressed interested in [facility]. CSW will fax pt's clinicals to [facility] once palliative care note is completed. CSW provided the [place] with contact information for further questions. CSW will continue to follow this pt and assist with discharge as needed.   Employment status:  Retired Forensic scientist:  Medicare PT Recommendations:  Not assessed at this time [place] / Referral to community resources:   (hospice list )  Patient/Family's Response to care:  Gerald Stabs reported that the staff has done well by the pt.   Patient/Family's Understanding of and Emotional Response to Diagnosis, Current Treatment, and Prognosis: Gerald Stabs acknowledged the pt's prognosis. Gerald Stabs reported that he want the pt to be comfortable and at peace.    Emotional Assessment Appearance:  Appears stated age Attitude/Demeanor/Rapport:   (Calm ) Affect (typically  observed):  Appropriate Orientation:  Oriented to Self, Oriented to Place Alcohol / Substance use:  Not Applicable Psych involvement (Current and /or in the community):  No (Comment)  Discharge Needs  Concerns to be addressed:  Denies Needs/Concerns at this time Readmission within the last 30 days:  Yes Current discharge risk:  None Barriers to Discharge:  No Barriers Identified   Rozelle Caudle, LCSW 07/09/2015, 1:29 PM

## 2015-07-09 NOTE — Progress Notes (Signed)
   07/09/15 1400  Clinical Encounter Type  Visited With Health care provider  Referral From Palliative care team  Spiritual Encounters  Spiritual Needs Ritual   Chaplain was asked to call a Catholic priest for the patient. Patient recently had a meeting with palliative care and mentioned a priest's presence being a major part of her end of life care. Chaplain has contacted a local priest who should arrive later today to do an anointing ritual with patient. Patient's nurse was made aware of coming priest. No family was present when chaplain stopped by room. Page chaplain if further assistance needed today.  [place] Highland  Pager: [phone] 2:40 PM

## 2015-07-09 NOTE — Progress Notes (Signed)
Wound vac leaking at dressing. Dressing re enforced. Dressing continued to leak at site. Dressing remove and a new dressing placed per protocol. Pt tolerated well. Will continue to monitor pt.

## 2015-07-09 NOTE — Consult Note (Signed)
Consultation Note Date: 07/09/2015   Patient Name: Sabrina Duncan  DOB: Sep 17, 1941  MRN: [id number]  Age / Sex: 74 y.o., female   PCP: Idelle Crouch, MD Referring Physician: Koleen Nimrod Acost*  Reason for Consultation: Establishing goals of care, Non pain symptom management, Pain control and Psychosocial/spiritual support  Palliative Care Assessment and Plan Summary of Established Goals of Care and Medical Treatment Preferences    Palliative Care Discussion Held Today:    This NP Wadie Lessen reviewed medical records, received report from team, assessed the patient and then meet at the patient's bedside along with her son Edwena Felty # [phone] and Jenny Reichmann Mirkhel/HPOA via conference call  to discuss diagnosis, prognosis, GOC, EOL wishes disposition and options.  A detailed discussion was had today regarding advanced directives.  Concepts specific to code status, artifical feeding and hydration, continued IV antibiotics and rehospitalization was had.  The difference between a aggressive medical intervention path  and a palliative comfort care path for this patient at this time was had.  Values and goals of care important to patient and family were attempted to be elicited.  Concept of Hospice and Palliative Care were discussed  Natural trajectory and expectations at EOL were discussed.  Questions and concerns addressed. Family encouraged to call with questions or concerns.  PMT will continue to support holistically.  Family is comfortable making a shift to a full comfort path, hoping for comfort and dignity, understanding the overall poor long term  prognosis.   Contacts/Participants in Discussion:  Primary Decision Maker: HPOA/son/ Eldridge Scot sons are working together to make decsions   Goals of Care/Code Status/Advance Care Planning:   Code Status:  DNR/DNI-comfort is main focus of care  Artificial feeding: not now or in the future  Comfort  feeds as tolerated, po meds as tolerated to enhance comfort as long as she is able to swallow  Antibiotics: none  Diagnostics:none  Rehospitalization:avoid   Symptom Management:   Pain/Dyspnea: Morphine  Psycho-social/Spiritual:    Support System: three sons  Desire for further Chaplaincy support:yes--family is requesting a Forensic psychologist for [place], Spiritual care Department is contacting presetnly  Prognosis: < 2 weeks  Discharge Planning:  Hospice facility       Chief Complaint: Continued physical and functional decline   History of Present Illness:   74 year old woman with multiple medical comorbidities who was transferred from [place] today as it was thought patient required an ID consultation and this was not readily available at [facility] today. Patient was admitted to [facility] on 7/7 for acute blood loss anemia resulting from a right hip hematoma. 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 Ola Spurr, infectious disease. Has a wound VAC to the right hip as well. The day prior to transfer she developed a temperature of 102.4 in the context of an apparent UTI and had a possible seizure versus myoclonic jerking.  Continued physical, functional and cognitive decline over the past several months regardless of aggressive medical interventions.  Family faced with advanced directive decisions and anticipatory care needs.   Primary Diagnoses   Present on Admission:  . Acute blood loss anemia . Essential hypertension . Delayed surgical wound healing . Severe recurrent major depression with psychotic features . Protein-calorie malnutrition, severe . UTI (lower urinary tract infection)  Palliative Review of Systems:    -unable to illicit due to decreased mental status  I have reviewed the medical record, interviewed the patient  and family, and examined the patient. The following aspects are pertinent.  Past Medical History  Diagnosis Date  . Depressive disorder, not elsewhere classified   . Unspecified osteomyelitis, site unspecified   . Polymyalgia rheumatica   . Paralysis agitans   . Osteoarthrosis, unspecified whether generalized or localized, unspecified site   . Muscle weakness (generalized)   . Difficulty in walking(719.7)   . Parkinson disease   . PMR (polymyalgia rheumatica)   . Coronary atherosclerosis of unspecified type of vessel, native or graft     2007: LAD PCI with a BMS, 80% distal LCX stenosis treated medically. Most recent stress test in 04/2012 showed distal anterio/apical scar? with no ischemia, EF 53%.   [name] Unspecified essential hypertension   . Other and unspecified hyperlipidemia   . Thoracic ascending aortic aneurysm     4.0 cm  . Complication of anesthesia     2013- hallucinations- [facility] (-spinal anesth. 2013- no problems   redo- hip- L)  . Esophageal reflux     pt. reports that its resolved   . Neuromuscular disorder     parkinson's - [facility] - neurologist   . Cancer     left breast cancer- lumpectomy, chemo, radiation, tamoxifen , SLNBx  . Anemia, unspecified     bld. transfusion- 02/2013   History   Social History  . Marital Status: Widowed    Spouse Name: N/A  . Number of Children: N/A  . Years of Education: N/A   Social History Main Topics  . Smoking status: Former Smoker -- 0.50 packs/day for 10 years    Types: Cigarettes    Quit date: 09/26/1984  . Smokeless tobacco: Not on file  . Alcohol Use: Yes     Comment: rarely  . Drug Use: No  . Sexual Activity: Not on file   Other Topics Concern  . Not on file   Social History Narrative   Family History  Problem Relation Age of Onset  . Hypertension Mother    Scheduled Meds: . antiseptic oral rinse  7 mL Mouth Rinse BID  . ARIPiprazole  5 mg Oral BID  . aspirin  81 mg Oral Daily    . atorvastatin  20 mg Oral QHS  . carbidopa-levodopa  2 tablet Oral QID  . Carbidopa-Levodopa ER  2 tablet Oral q1800   And  . entacapone  200 mg Oral q1800  . cholecalciferol  2,000 Units Oral Daily  . clindamycin  300 mg Oral BID  . collagenase   Topical Daily  . docusate sodium  100 mg Oral BID  . feeding supplement (ENSURE ENLIVE)  237 mL Oral TID BM  . ferrous [id number] C-folic acid  1 capsule Oral BID  . imipenem-cilastatin  250 mg Intravenous 4 times per day  . linezolid  600 mg Intravenous Q12H  . mirtazapine  15 mg Oral QHS  . QUEtiapine  150 mg Oral QHS  . rifampin  300 mg Oral BID  . rOPINIRole  2 mg Oral TID  . venlafaxine XR  150 mg Oral BID   Continuous Infusions: . sodium chloride 75 mL/hr at 07/09/15 0700   PRN Meds:.acetaminophen **OR** acetaminophen, morphine injection, ondansetron **OR** ondansetron (ZOFRAN) IV, senna-docusate Medications Prior to Admission:  Prior to Admission medications   Medication Sig Start Date End Date Taking? Authorizing Provider  acetaminophen (TYLENOL) 325 MG tablet Take 2 tablets (650 mg total) by mouth  every 6 (six) hours as needed for mild pain (or Fever >/= 101). 05/14/15   Aldean Jewett, MD  Amino Acids-Protein Hydrolys (FEEDING SUPPLEMENT, PRO-STAT SUGAR FREE 64,) LIQD Take 30 mLs by mouth 2 (two) times daily.    Historical Provider, MD  ARIPiprazole (ABILIFY) 5 MG tablet Take 1 tablet (5 mg total) by mouth daily. Patient taking differently: Take 5 mg by mouth 2 (two) times daily.  05/14/15   Aldean Jewett, MD  aspirin 81 MG chewable tablet Chew 81 mg by mouth daily.    Historical Provider, MD  atorvastatin (LIPITOR) 20 MG tablet Take 20 mg by mouth at bedtime.     Historical Provider, MD  carbidopa-levodopa (SINEMET CR) 50-200 MG per tablet Take 2 tablets by mouth 4 (four) times daily.    Historical Provider, MD  carbidopa-levodopa-entacapone (STALEVO) 50-200-200 MG per tablet Take 1 tablet by mouth every  evening.     Historical Provider, MD  cefTRIAXone (ROCEPHIN) 20 MG/ML IVPB Inject 50 mLs (1 g total) into the vein daily. 07/06/15   Erline Hau, MD  Cholecalciferol (RA VITAMIN D-3) 2000 UNITS CAPS Take 1 capsule by mouth daily.    Historical Provider, MD  clindamycin (CLEOCIN) 300 MG capsule Take 300 mg by mouth 2 (two) times daily.    Historical Provider, MD  collagenase (SANTYL) ointment Apply topically daily. Patient not taking: Reported on 06/27/2015 06/24/15   Aldean Jewett, MD  docusate sodium 100 MG CAPS Take 100 mg by mouth 2 (two) times daily. 10/03/13   Tania Ade, MD  Ferrous Fumarate (FERROCITE) 324 (106 FE) MG TABS Take 1 tablet by mouth 2 (two) times daily.    Historical Provider, MD  ferrous fumarate (HEMOCYTE - 106 MG FE) 325 (106 FE) MG TABS tablet Take 1 tablet (106 mg of iron total) by mouth 2 (two) times daily. Patient not taking: Reported on 06/13/2015 05/28/15   Hillary Bow, MD  LORazepam (ATIVAN) 0.5 MG tablet Take 1 tablet (0.5 mg total) by mouth every 12 (twelve) hours as needed for anxiety. 07/06/15   Erline Hau, MD  metoprolol (LOPRESSOR) 50 MG tablet Take 50 mg by mouth 2 (two) times daily.    Historical Provider, MD  mirtazapine (REMERON) 15 MG tablet Take 1 tablet (15 mg total) by mouth at bedtime. 07/06/15   Erline Hau, MD  nitrofurantoin, macrocrystal-monohydrate, (MACROBID) 100 MG capsule Take 1 capsule (100 mg total) by mouth 2 (two) times daily. For 7 days 07/06/15   Erline Hau, MD  QUEtiapine (SEROQUEL) 50 MG tablet Take 3 tablets (150 mg total) by mouth at bedtime. 07/06/15   Erline Hau, MD  rifampin (RIFADIN) 300 MG capsule Take 300 mg by mouth 2 (two) times daily.    Historical Provider, MD  rOPINIRole (REQUIP) 2 MG tablet Take 2 mg by mouth 3 (three) times daily.    Historical Provider, MD  senna (SENOKOT) 8.6 MG TABS tablet Take 1 tablet (8.6 mg total) by mouth daily as needed for mild  constipation. Patient not taking: Reported on 06/27/2015 05/14/15   Aldean Jewett, MD  traMADol (ULTRAM) 50 MG tablet Take 1 tablet (50 mg total) by mouth every 6 (six) hours as needed for moderate pain. 07/06/15   Erline Hau, MD  venlafaxine XR (EFFEXOR-XR) 150 MG 24 hr capsule Take 150 mg by mouth 2 (two) times daily.     Historical Provider, MD  vitamin B-12 (CYANOCOBALAMIN)  1000 MCG tablet Take 1,000 mcg by mouth daily.     Historical Provider, MD   Allergies  Allergen Reactions  . Morphine And Related Other (See Comments)    Reaction:  Hallucinations   . Oxycodone Other (See Comments)    Reaction:  Unknown   . Sulfa Antibiotics Itching  . Tazobactam Other (See Comments)    Reaction:  Unknown   . Adhesive [Tape] Rash  . Isosorbide Rash   CBC:    Component Value Date/Time   WBC 6.1 07/06/2015 1525   HGB 8.6* 07/06/2015 1525   HCT 28.1* 07/06/2015 1525   PLT 386 07/06/2015 1525   MCV 90.4 07/06/2015 1525   NEUTROABS 7.1* 06/28/2015 1735   LYMPHSABS 0.8* 06/28/2015 1735   MONOABS 0.6 06/28/2015 1735   EOSABS 0.1 06/28/2015 1735   BASOSABS 0.1 06/28/2015 1735   Comprehensive Metabolic Panel:    Component Value Date/Time   NA 134* 07/05/2015 0245   K 3.8 07/05/2015 0245   CL 101 07/05/2015 0245   CO2 26 07/05/2015 0245   BUN 11 07/05/2015 0245   CREATININE 0.55 07/05/2015 0245   CREATININE 0.58 11/15/2013 1513   GLUCOSE 118* 07/05/2015 0245   CALCIUM 8.0* 07/05/2015 0245   AST 12* 06/17/2015 0121   ALT <5* 06/17/2015 0121   ALKPHOS 107 06/17/2015 0121   BILITOT 0.5 06/17/2015 0121   PROT 5.3* 06/17/2015 0121   ALBUMIN 2.0* 06/17/2015 0121    Physical Exam:  Vital Signs: BP 106/41 mmHg  Pulse 116  Temp(Src) 98.3 F (36.8 C) (Oral)  Resp 21  Ht 5\' 4"  (1.626 m)  Wt 52.935 kg (116 lb 11.2 oz)  BMI 20.02 kg/m2  SpO2 97% SpO2: SpO2: 97 % O2 Device: O2 Device: Nasal Cannula O2 Flow Rate: O2 Flow Rate (L/min): 1 L/min Intake/output summary:    Intake/Output Summary (Last 24 hours) at 07/09/15 1315 Last data filed at 07/09/15 1205  Gross per 24 hour  Intake   2960 ml  Output    875 ml  Net   2085 ml   LBM: Last BM Date: 07/09/15 Baseline Weight: Weight: 52.935 kg (116 lb 11.2 oz) Most recent weight: Weight: 52.935 kg (116 lb 11.2 oz)  Exam Findings:   General: ill appearing, frail, restless HEENT: dry buccal membranes  CVS: tachycardic, rate 110 Resp: decreased in bases Skin: warm and dry          Palliative Performance Scale: 20 %                 Additional Data Reviewed: Recent Labs     07/06/15  1525  WBC  6.1  HGB  8.6*  PLT  386     Time In: 1230 Time Out: 1345 Time Total: 75 min  Greater than 50%  of this time was spent counseling and coordinating care related to the above assessment and plan.  Discussed with  Dr  Jerilee Hoh  Signed by: Wadie Lessen, NP  Knox Royalty, NP  07/09/2015, 1:15 PM  Please contact Palliative Medicine Team phone at [phone] for questions and concerns.   See AMION for contact information

## 2015-07-10 DIAGNOSIS — Z515 Encounter for palliative care: Secondary | ICD-10-CM

## 2015-07-10 DIAGNOSIS — R451 Restlessness and agitation: Secondary | ICD-10-CM

## 2015-07-10 DIAGNOSIS — R509 Fever, unspecified: Secondary | ICD-10-CM

## 2015-07-10 DIAGNOSIS — R52 Pain, unspecified: Secondary | ICD-10-CM

## 2015-07-10 MED ORDER — MORPHINE SULFATE 2 MG/ML IJ SOLN
1.0000 mg | INTRAMUSCULAR | Status: DC
  Administered 2015-07-10: 1 mg via INTRAVENOUS
  Filled 2015-07-10: qty 1

## 2015-07-10 MED ORDER — LORAZEPAM 2 MG/ML IJ SOLN
1.0000 mg | Freq: Four times a day (QID) | INTRAMUSCULAR | Status: DC
Start: 2015-07-10 — End: 2015-07-10

## 2015-07-10 MED ORDER — LORAZEPAM 1 MG PO TABS: 1.0000 mg | ORAL_TABLET | ORAL | Status: AC | PRN

## 2015-07-10 MED ORDER — MORPHINE SULFATE (CONCENTRATE) 10 MG /0.5 ML PO SOLN: 10.0000 mg | ORAL | Status: AC | PRN

## 2015-07-10 NOTE — Clinical Social Work Note (Signed)
CSW spoke with the pt's son Gerald Stabs. CSW confirmed that [facility] can accepted the pt today. CSW and Gerald Stabs discussed ambulance transport. CSW contact [facility] at [phone] to schedule transport for the pt. CSW faxed the pt's discharge summary to [facility]. Bedside RN provided the number to call reported.   [place], MSW, [place]

## 2015-07-10 NOTE — Progress Notes (Addendum)
Report called to receiving RN, all questions answered, pt palliative care, pt personal belongings with daughter in law (jamie) glassess, upper dentures, clothing, wound vac d/c wet to dry placed per protocol, jamie at [facility], family updated by jamie at [facility], all questions answered, ativan given for supportive measurements for transfer. Pt's foley and PICC left in tact per MD.

## 2015-07-10 NOTE — Clinical Social Work Note (Addendum)
CSW placed a follow up call to [facility]. CSW spoke with [name] about taking the pt. [name] reported that the CSW needed to talk to [employer]. CSW left [name] a voice message.   Addendum: CSW spoke with [name] requested additional information about the pt before she can determine if she could accept the pt. Additional information faxed. CSW is awaiting a call back from [name].   Addendum: CSW placed a follow-up call to [name]. CSW informed by [name] the Network engineer, that [name] is in a staff meeting and will go to lunch at 1230pm and then back to her meeting. CSW spoke with the pt's son Sabrina Duncan to provided updates. Sabrina Duncan shared that he spoke with [name] and [name] reviewed the notes which indicated they have accepted the pt, but [name] needs to arrange the time. CSW informed Sabrina Duncan that two voice message has been left for [name] already. CSW is awaiting a call back from [name].     Linda, MSW, [place]

## 2015-07-10 NOTE — Progress Notes (Signed)
Daily Progress Note   Patient Name: Sabrina Duncan       Date: 07/10/2015 DOB: 1941/11/08  Age: 74 y.o. MRN#: [id number] Attending Physician: Mikki Harbor* Primary Care Physician: Idelle Crouch, MD Admit Date: 07/04/2015  Reason for Consultation/Follow-up: Disposition, Non pain symptom management, Pain control, Psychosocial/spiritual support and Terminal care  Subjective:    -no family at bedside, patient is unable to communicate verbally  -appears generally uncomfortable, easily agitated when stimulated   Length of Stay: 6 days  Current Medications: Scheduled Meds:  . antiseptic oral rinse  7 mL Mouth Rinse BID  . collagenase   Topical Daily  . LORazepam  1 mg Intravenous Q6H  .  morphine injection  1 mg Intravenous Q4H    Continuous Infusions: . sodium chloride 10 mL/hr at 07/09/15 1900    PRN Meds: acetaminophen **OR** acetaminophen, bisacodyl, LORazepam, morphine injection, ondansetron **OR** ondansetron (ZOFRAN) IV  Palliative Performance Scale: 20 %     Vital Signs: BP 91/50 mmHg  Pulse 94  Temp(Src) 99.6 F (37.6 C) (Axillary)  Resp 19  Ht 5\' 4"  (1.626 m)  Wt 52.935 kg (116 lb 11.2 oz)  BMI 20.02 kg/m2  SpO2 98% SpO2: SpO2: 98 % O2 Device: O2 Device: Nasal Cannula O2 Flow Rate: O2 Flow Rate (L/min): 1 L/min  Intake/output summary:  Intake/Output Summary (Last 24 hours) at 07/10/15 0926 Last data filed at 07/10/15 0411  Gross per 24 hour  Intake    790 ml  Output   1025 ml  Net   -235 ml   LBM: Last BM Date: 07/09/15 Baseline Weight: Weight: 52.935 kg (116 lb 11.2 oz) Most recent weight: Weight: 52.935 kg (116 lb 11.2 oz)  Physical Exam:              General: ill appearing, frail, restless HEENT: dry buccal membranes, + temporal muscle wasting  CVS: tachycardic, rate 106 Resp: decreased in bases Skin: warm and dry  Additional Data Reviewed: No results for input(s): WBC, HGB, PLT, NA, BUN, CREATININE, ALB in the  last 72 hours.   Problem List:  Patient Active Problem List   Diagnosis Date Noted  . Palliative care encounter 07/10/2015  . Pain, generalized 07/10/2015  . Agitation 07/10/2015  . Severe sepsis 07/08/2015  . UTI (urinary tract infection) 07/04/2015  . Fever 07/04/2015  . Seizures 07/04/2015  . UTI (lower urinary tract infection) 07/04/2015  . Subacute delirium 07/02/2015  . Severe recurrent major depression with psychotic features 07/02/2015  . Delayed surgical wound healing 06/28/2015  . Pressure ulcer 06/18/2015  . Hyponatremia 06/17/2015  . Arthritis 06/13/2015  . Benign breast lumps 06/13/2015  . Clinical depression 06/13/2015  . Hemorrhoid 06/13/2015  . Protein-calorie malnutrition, severe 05/28/2015  . Anemia 05/27/2015  . Acute blood loss anemia 05/14/2015  . Hematoma 05/14/2015  . Parkinson's disease 05/14/2015  . Generalized anxiety disorder 05/14/2015  . Coronary atherosclerosis   . Essential hypertension   . HLD (hyperlipidemia)   . Thoracic ascending aortic aneurysm      Palliative Care Assessment & Plan    Code Status:  DNR  Goals of Care:  -Focus of care is full comfort; fluids, Iv antibiotics, diagnostics  have been stopped -Continued decline, no po intake per nursing and unable to take po medications.  Non verbal, unable to follow commands. -need for scheduled medications for symptom management   3. Symptom Management:  Pain/Dyspnea: Morphine 1 mg IV every 4 hrs scheduled and every 1 hr  prn  Agitation: Ativan 1 mg IV every 6 hrs scheduled  4. Palliative Prophylaxis:   Constipation: Dulcolax supp prn  5. Prognosis: < 2 weeks  5. Discharge Planning: Hospice facility   Care plan was discussed with [place]/ LCSW and placed call to [facility] at [place]  Thank you for allowing the Palliative Medicine Team to assist in the care of this patient.   Time In: 0900 Time Out: 0935 Total Time 35 Minutes Prolonged Time Billed  no      Greater than 50%  of this time was spent counseling and coordinating care related to the above assessment and plan.  Knox Royalty, NP  07/10/2015, 9:26 AM  Please contact Palliative Medicine Team phone at [phone] for questions and concerns.

## 2015-07-10 NOTE — Discharge Summary (Signed)
Physician Discharge Summary  Sabrina Duncan KNL:[id number] DOB: 06-20-1941 DOA: 07/04/2015  PCP: Sabrina Crouch, MD  Admit date: 07/04/2015 Discharge date: 07/10/2015  Time spent: 45 minutes  Recommendations for Outpatient Follow-up:  -Will be discharged to residential hospice.   Discharge Diagnoses:  Active Problems:   Essential hypertension   Acute blood loss anemia   Hematoma   Protein-calorie malnutrition, severe   Delayed surgical wound healing   Severe recurrent major depression with psychotic features   UTI (urinary tract infection)   Fever   Seizures   UTI (lower urinary tract infection)   Severe sepsis   Discharge Condition: Sabrina Duncan Weights   07/08/15 1928  Weight: 52.935 kg (116 lb 11.2 oz)    History of present illness:  Patient is a 74 year old woman with multiple medical comorbidities who was transferred from [place] today as it was thought patient required an ID consultation and this was not readily available at [place] today. Patient was admitted to [facility] on 7/7 for acute blood loss anemia resulting from a right hip hematoma. 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 Ola Spurr, infectious disease. Has a wound VAC to the right hip as well. The day prior to transfer she developed a temperature of 102.4 in the context of an apparent UTI and had a possible seizure versus myoclonic jerking. I do not see where this urine infection was treated. We received request for transfer today for a potential ID consultation as patient continues to spike temperatures despite long-term antibiotics for her right hip hardware infection. Patient today is mostly concerned about her hallucinations and how bothersome they are.   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 She became febrile and septic and as found to have urine cx positive for VRE and citrobacter. A subsequent CXR with an infiltrate c/w HCAP. She has continued to deteriorate. Had family meeting 3 days ago and decided to give patient 48 hours on IVF and antibiotics, however she continued to deteriorate and has become very confused and unresponsive and starting moaning as if in pain. Purulent drainage was noted from her right hip wound vac as well. After family meeting with palliative care on 7/18, it was decided to opt for full comfort care and proceed with residential hospice placement, which will occur today.  Procedures:  None   Consultations:  Palliative Care Medicine  Discharge Instructions  Discharge Instructions    Diet - low sodium heart healthy    Complete by:  As directed      Diet - low sodium heart healthy    Complete by:  As directed      Increase activity slowly    Complete by:  As directed      Increase activity slowly    Complete by:  As directed             Medication List    STOP taking these medications        ARIPiprazole 5 MG tablet  Commonly known as:  ABILIFY     aspirin 81 MG chewable tablet     atorvastatin 20 MG tablet  Commonly known as:  LIPITOR  carbidopa-levodopa 50-200 MG per tablet  Commonly known as:  SINEMET CR     carbidopa-levodopa-entacapone 50-200-200 MG per tablet  Commonly known as:  STALEVO     clindamycin 300 MG capsule  Commonly known as:  CLEOCIN     collagenase ointment  Commonly known as:  SANTYL     DSS 100 MG Caps     FERROCITE 324 (106 FE) MG Tabs  Generic drug:  Ferrous Fumarate     ferrous fumarate 325 (106 FE) MG Tabs tablet  Commonly known as:  HEMOCYTE - 106 mg FE     metoprolol 50  MG tablet  Commonly known as:  LOPRESSOR     metroNIDAZOLE 250 MG tablet  Commonly known as:  FLAGYL     RA VITAMIN D-3 2000 UNITS Caps  Generic drug:  Cholecalciferol     rifampin 300 MG capsule  Commonly known as:  RIFADIN     rOPINIRole 2 MG tablet  Commonly known as:  REQUIP     senna 8.6 MG Tabs tablet  Commonly known as:  SENOKOT     traMADol 50 MG tablet  Commonly known as:  ULTRAM     venlafaxine XR 150 MG 24 hr capsule  Commonly known as:  EFFEXOR-XR     vitamin B-12 1000 MCG tablet  Commonly known as:  CYANOCOBALAMIN      TAKE these medications        acetaminophen 325 MG tablet  Commonly known as:  TYLENOL  Take 2 tablets (650 mg total) by mouth every 6 (six) hours as needed for mild pain (or Fever >/= 101).     feeding supplement (PRO-STAT SUGAR FREE 64) Liqd  Take 30 mLs by mouth 2 (two) times daily.     LORazepam 1 MG tablet  Commonly known as:  ATIVAN  Take 1 tablet (1 mg total) by mouth every 2 (two) hours as needed for anxiety.     morphine CONCENTRATE 10 mg / 0.5 ml concentrated solution  Take 0.5 mLs (10 mg total) by mouth every 4 (four) hours as needed for severe pain.       Allergies  Allergen Reactions  . Morphine And Related Other (See Comments)    Reaction:  Hallucinations   . Oxycodone Other (See Comments)    Reaction:  Unknown   . Sulfa Antibiotics Itching  . Tazobactam Other (See Comments)    Reaction:  Unknown   . Adhesive [Tape] Rash  . Isosorbide Rash      The results of significant diagnostics from this hospitalization (including imaging, microbiology, ancillary and laboratory) are listed below for reference.    Significant Diagnostic Studies: Ct Head Wo Contrast  07/04/2015   CLINICAL DATA:  Initial evaluation for acute seizure.  EXAM: CT HEAD WITHOUT CONTRAST  TECHNIQUE: Contiguous axial images were obtained from the base of the skull through the vertex without intravenous contrast.  COMPARISON:  Prior study from  02/09/2015  FINDINGS: Diffuse prominence of the CSF containing spaces is compatible with generalized cerebral atrophy. Patchy and confluent hypodensity within the periventricular and deep white matter both cerebral hemispheres present, most consistent with chronic small vessel ischemic disease, stable from previous. Remote right cerebellar infarct noted, also stable.  No acute large vessel territory infarct. No mass lesion, midline shift, or mass effect. No extra-axial fluid collection.  Scalp soft tissues within normal limits.  No acute abnormality about the orbits.  Paranasal sinuses and mastoid air cells are clear.  Calvarium intact.  IMPRESSION: 1. No acute intracranial process identified. 2. 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   Electronically Signed   By: Jeannine Boga M.D.   On: 07/04/2015 05:28   Mr Hip Right Wo Contrast  06/27/2015   CLINICAL DATA:  Total right hip replacement with persistent bleeding from the wound, anemia, and hypotension with transfusions.  EXAM: MR OF THE RIGHT HIP WITHOUT CONTRAST  TECHNIQUE: Multiplanar, multisequence MR imaging was performed. No intravenous contrast was administered. Metal artifact reduction protocol sequencing was performed.  COMPARISON:  Multiple exams, including 05/09/2015  FINDINGS: Greater than normal metal artifact along the right hip due to the lateral cage, cerclage wires, and hip prosthesis. This results in more artifact than is often encountered.  We do observe a a 13.1 by approximately 4.5 by 7.6 cm fluid collection along the posterolateral trochanteric margin of the right hip prosthesis appearing to extend towards the skin along the wound. This has high inversion recovery and low T1 signal characteristics and may reflect blood products. Infection/abscess not readily excluded.  Proximal hamstring tendons intact. The degree of artifact precludes assessment of  the bone of the right hip. There is deformity of the right inferior pubic ramus from an old fracture as well as indistinctness of the quadrilateral plate from known prior acetabular injury.  The uterus, urinary bladder, and visualized bowel appear unremarkable.  Prominent atrophy of the right gluteal musculature.  IMPRESSION: 1. Persistent fluid collection lateral to the projected trochanteric region of the right hip. This probably drains to the wound based on continuity with the scan. The lateral cage, cerclage, and total hip prosthesis generate more than the normal amount of susceptibility artifact resulting in a considerable degree of metal artifact obscuration of surrounding structures despite the use of metal artifact reduction sequences. 2. Prominent atrophy of the right gluteal musculature. 3. Deformity of the right inferior pubic ramus with known deformity of the right acetabulum. Bony structures could be better assessed by CT, if clinically warranted.   Electronically Signed   By: Van Clines M.D.   On: 06/27/2015 17:38   Dg Chest Port 1 View  07/07/2015   CLINICAL DATA:  Confirm line placement. History of cancer and collagen vascular disease, hypertension.  EXAM: PORTABLE CHEST - 1 VIEW  COMPARISON:  07/06/2015  FINDINGS: Right-sided PICC line tip overlies the level of the superior vena cava. No pneumothorax. Patient is rotated. Heart is enlarged. There is dense opacity at the left lung base, consistent with atelectasis or infiltrate. Opacity appears increased since the previous exams. Left axillary surgical clips. Previous right shoulder arthroplasty.  IMPRESSION: 1. Right-sided PICC line tip to the superior vena cava. 2. Cardiomegaly. 3. Increasing density in the left lung base.   Electronically Signed   By: Nolon Nations M.D.   On: 07/07/2015 12:12   Dg Chest Port 1 View  07/06/2015   CLINICAL DATA:  Shortness of Breath  EXAM: PORTABLE CHEST - 1 VIEW  COMPARISON:  July 02, 2015   FINDINGS: Patient remains rotated. No edema or consolidation is appreciable. Heart is mildly enlarged with pulmonary vascularity within normal limits. No adenopathy. There is atherosclerotic change in aorta, stable. There is a total shoulder replacement on the right. There is evidence of an old fracture of the left proximal humeral metaphysis, stable. There are surgical clips in left axillary region.  IMPRESSION: Stable cardiac prominence. No edema or consolidation. No appreciable change compared to recent prior study.  Electronically Signed   By: Lowella Grip III M.D.   On: 07/06/2015 14:45   Dg Chest Port 1 View  07/02/2015   CLINICAL DATA:  Shortness of Breath  EXAM: PORTABLE CHEST - 1 VIEW  COMPARISON:  None.  FINDINGS: Patient is rotated. There is no edema or consolidation. Heart is mildly enlarged with pulmonary vascularity within normal limits. No adenopathy. There is atherosclerotic change in aorta. There is a total shoulder replacement on the right. There is evidence of an old healed fracture of the proximal left humeral metaphysis. There is also an old healed fracture of the lateral left clavicle. There are surgical clips in the left axillary region with evidence of prior left mastectomy.  IMPRESSION: No edema or consolidation.  Mild cardiac enlargement.   Electronically Signed   By: Lowella Grip III M.D.   On: 07/02/2015 13:33   Dg Hip Unilat With Pelvis 2-3 Views Right  06/27/2015   CLINICAL DATA:  RIGHT hip surgery in April and June, drainage from RIGHT hip wound  EXAM: RIGHT HIP (WITH PELVIS) 2-3 VIEWS  COMPARISON:  06/19/2015  FINDINGS: Diffuse osseous demineralization.  BILATERAL hip prostheses.  Deformity of proximal LEFT femur and greater trochanter.  Multiple cerclage wires and lateral plate at proximal RIGHT femur.  Several displaced bone fragments are noted cranial to the RIGHT greater trochanter.  Questionable osseous lucency involving the greater trochanter and at the proximal  margin of the medial femur at the intertrochanteric region.  Unable to exclude osteomyelitis with this appearance.  Old fracture deformities of the pubic rami noted.  No acute pelvic abnormalities seen.  IMPRESSION: BILATERAL hip prostheses.  Questionable bone destruction/lucency involving the proximal RIGHT femur versus resorption secondary to recent surgery; unable to exclude osteomyelitis with this appearance.  Significant osseous demineralization with old healed pubic rami fractures.   Electronically Signed   By: Lavonia Dana M.D.   On: 06/27/2015 13:01   Dg Hip Unilat With Pelvis 2-3 Views Right  06/19/2015   CLINICAL DATA:  Right hip pain.  EXAM: RIGHT HIP (WITH PELVIS) 2-3 VIEWS  COMPARISON:  Radiographs dated 05/12/2015 and CT scan dated 05/09/2015  FINDINGS: There is dystrophic calcification in the soft tissues at the site of the wall fracture of the medial wall of the right acetabulum. There is minimal displacement at that site. Small displaced bone fragments are seen in the soft tissues above the right hip but these appear less prominent than on the prior study. Right hip hardware appears unchanged. Old deformities of the pubic rami bilaterally.  No appreciable significant change in the appearance of the right hip since the radiographs of 05/12/2015.  IMPRESSION: Essentially stable appearance of the right hip since 05/12/2015.   Electronically Signed   By: Lorriane Shire M.D.   On: 06/19/2015 18:56    Microbiology: Recent Results (from the past 240 hour(s))  Urine culture     Status: None   Collection Time: 07/02/15 10:41 PM  Result Value Ref Range Status   Specimen Description URINE, CLEAN CATCH  Final   Special Requests clindamycin, metronidazole, rifampin  Final   Culture   Final    >=100,000 COLONIES/mL CITROBACTER AMALONATICUS >=100,000 COLONIES/mL ENTEROCOCCUS FAECIUM CRITICAL RESULT CALLED TO, READ BACK BY AND VERIFIED WITH: JOANNA LINDSAY MC3S ON 07/06/15 AT 0805 BY JEF VRE HAVE  INTRINSIC RESISTANCE TO MOST COMMONLY USED ANTIBIOTICS AND THE ABILITY TO ACQUIRE RESISTANCE TO MOST AVAILABLE ANTIBIOTICS.    Report Status 07/06/2015 FINAL  Final   Organism  ID, Bacteria CITROBACTER AMALONATICUS  Final   Organism ID, Bacteria ENTEROCOCCUS FAECIUM  Final      Susceptibility   Citrobacter amalonaticus - MIC*    CEFAZOLIN 32 RESISTANT Resistant     CEFTRIAXONE <=1 SENSITIVE Sensitive     CIPROFLOXACIN 2 INTERMEDIATE Intermediate     GENTAMICIN <=1 SENSITIVE Sensitive     IMIPENEM <=0.25 SENSITIVE Sensitive     NITROFURANTOIN 64 INTERMEDIATE Intermediate     TRIMETH/SULFA <=20 SENSITIVE Sensitive     * >=100,000 COLONIES/mL CITROBACTER AMALONATICUS   Enterococcus faecium - MIC*    AMPICILLIN >=32 RESISTANT Resistant     NITROFURANTOIN 64 INTERMEDIATE Intermediate     VANCOMYCIN Value in next row Resistant      >=32 RESISTANTCONFIRMED BY VANCOMYCIN SCREEN AGAR    CIPROFLOXACIN Value in next row Resistant      RESISTANT>=8    LEVOFLOXACIN Value in next row Resistant      RESISTANT>=8    TETRACYCLINE Value in next row Resistant      RESISTANT>=16    GENTAMICIN SYNERGY Value in next row Sensitive      RESISTANT>=16    * >=100,000 COLONIES/mL ENTEROCOCCUS FAECIUM  Wound culture     Status: None   Collection Time: 07/04/15 10:10 AM  Result Value Ref Range Status   Specimen Description BUTTOCKS  Final   Special Requests NONE  Final   Gram Stain   Final    FEW WBC SEEN FEW GRAM NEGATIVE RODS RARE GRAM VARIABLE ROD RARE GRAM POSITIVE COCCI IN PAIRS    Culture   Final    MODERATE GROWTH CITROBACTER AMALONATICUS WITH NORMAL SKIN FLORA    Report Status 07/08/2015 FINAL  Final   Organism ID, Bacteria CITROBACTER AMALONATICUS  Final      Susceptibility   Citrobacter amalonaticus - MIC*    CEFTAZIDIME <=1 SENSITIVE Sensitive     CEFAZOLIN >=64 RESISTANT Resistant     CEFTRIAXONE <=1 SENSITIVE Sensitive     CIPROFLOXACIN 2 INTERMEDIATE Intermediate     GENTAMICIN  <=1 SENSITIVE Sensitive     IMIPENEM <=0.25 SENSITIVE Sensitive     TRIMETH/SULFA <=20 SENSITIVE Sensitive     NITROFURANTOIN Value in next row Intermediate      INTERMEDIATE64    PIP/TAZO Value in next row Sensitive      SENSITIVE8    ERTAPENEM Value in next row Sensitive      SENSITIVE<=0.5    LEVOFLOXACIN Value in next row Intermediate      INTERMEDIATE4    * MODERATE GROWTH CITROBACTER AMALONATICUS  Culture, blood (routine x 2)     Status: None   Collection Time: 07/04/15 10:32 AM  Result Value Ref Range Status   Specimen Description BLOOD  Final   Special Requests Normal AEROBIC 7MP, ANAEROBIC 7ML  Final   Culture NO GROWTH 5 DAYS  Final   Report Status 07/09/2015 FINAL  Final  Culture, blood (routine x 2)     Status: None   Collection Time: 07/04/15 10:45 AM  Result Value Ref Range Status   Specimen Description BLOOD  Final   Special Requests Normal AEROBIC 3ML, ANAEROBIC 1ML  Final   Culture NO GROWTH 5 DAYS  Final   Report Status 07/09/2015 FINAL  Final  MRSA PCR Screening     Status: None   Collection Time: 07/04/15  5:26 PM  Result Value Ref Range Status   MRSA by PCR NEGATIVE NEGATIVE Final    Comment:  The GeneXpert MRSA Assay (FDA approved for NASAL specimens only), is one component of a comprehensive MRSA colonization surveillance program. It is not intended to diagnose MRSA infection nor to guide or monitor treatment for MRSA infections.   Urine culture     Status: None   Collection Time: 07/04/15  7:52 PM  Result Value Ref Range Status   Specimen Description URINE, CATHETERIZED  Final   Special Requests NONE  Final   Culture   Final    MULTIPLE SPECIES PRESENT, SUGGEST RECOLLECTION IF CLINICALLY INDICATED   Report Status 07/06/2015 FINAL  Final  Culture, blood (routine x 2)     Status: None (Preliminary result)   Collection Time: 07/06/15  3:25 PM  Result Value Ref Range Status   Specimen Description BLOOD RIGHT HAND  Final   Special Requests  BOTTLES DRAWN AEROBIC ONLY 10CC  Final   Culture NO GROWTH 3 DAYS  Final   Report Status PENDING  Incomplete  Culture, blood (routine x 2)     Status: None (Preliminary result)   Collection Time: 07/06/15  3:30 PM  Result Value Ref Range Status   Specimen Description BLOOD LEFT ANTECUBITAL  Final   Special Requests   Final    BOTTLES DRAWN AEROBIC AND ANAEROBIC 10CC AER 5CC ANA   Culture NO GROWTH 3 DAYS  Final   Report Status PENDING  Incomplete     Labs: Basic Metabolic Panel:  Recent Labs Lab 07/04/15 0453 07/05/15 0245  NA 136 134*  K 3.8 3.8  CL 105 101  CO2 28 26  GLUCOSE 119* 118*  BUN 15 11  CREATININE 0.61 0.55  CALCIUM 7.9* 8.0*   Liver Function Tests: No results for input(s): AST, ALT, ALKPHOS, BILITOT, PROT, ALBUMIN in the last 168 hours. No results for input(s): LIPASE, AMYLASE in the last 168 hours. No results for input(s): AMMONIA in the last 168 hours. CBC:  Recent Labs Lab 07/04/15 0453 07/05/15 0245 07/06/15 1525  WBC 3.1* 3.8* 6.1  HGB 8.9* 8.7* 8.6*  HCT 26.3* 27.5* 28.1*  MCV 89.1 91.4 90.4  PLT 379 372 386   Cardiac Enzymes:  Recent Labs Lab 07/06/15 1525 07/06/15 2035 07/07/15 0253  TROPONINI 1.08* 0.85* 0.73*   BNP: BNP (last 3 results) No results for input(s): BNP in the last 8760 hours.  ProBNP (last 3 results) No results for input(s): PROBNP in the last 8760 hours.  CBG:  Recent Labs Lab 07/04/15 0401  GLUCAP 121*       Signed:  HERNANDEZ ACOSTA,ESTELA  [facility] Pager: [phone] 07/10/2015, 8:34 AM

## 2015-07-11 ENCOUNTER — Other Ambulatory Visit: Payer: Medicare Other

## 2015-07-11 ENCOUNTER — Ambulatory Visit: Payer: Medicare Other | Admitting: Hematology and Oncology

## 2015-07-11 LAB — CULTURE, BLOOD (ROUTINE X 2)
CULTURE: NO GROWTH
CULTURE: NO GROWTH

## 2015-07-23 DEATH — deceased

## 2017-04-07 IMAGING — CT CT FEMUR *R* W/O CM
2 series · 10 of 14 positions shown, 12 images · non-contrast
Comparison: Plain films of the right hip 12/10/2013.

CLINICAL DATA: Status post revision of right hip replacement 3
weeks ago at an outside facility. Subsequent development of a
hematoma and decreased hemoglobin. Pain.

EXAM:
CT OF THE RIGHT FEMUR WITHOUT CONTRAST
TECHNIQUE: Multidetector CT imaging was performed according to the standard
protocol. Multiplanar CT image reconstructions were also generated.

[Series 4: soft tissue axials · axial · 0.53mm/px · z∈[+29,+363]mm · 4 of 279 slices shown]
[im 56/279  soft-tissue]
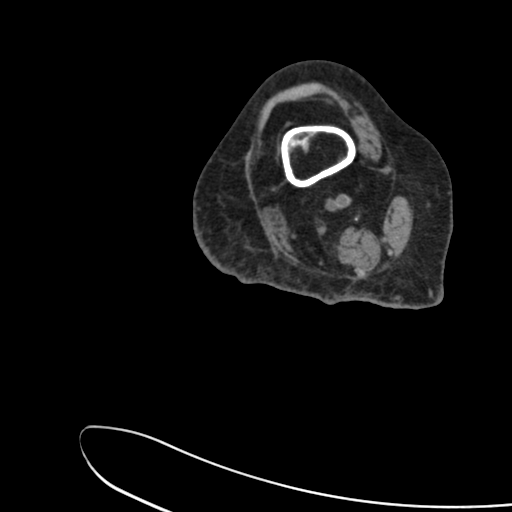
[im 112/279  soft-tissue]
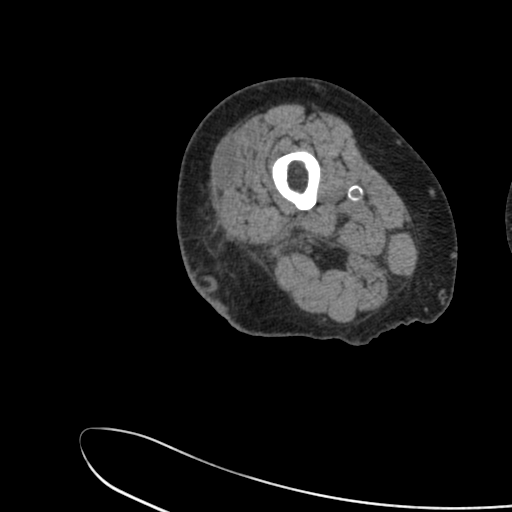
[im 167/279  soft-tissue]
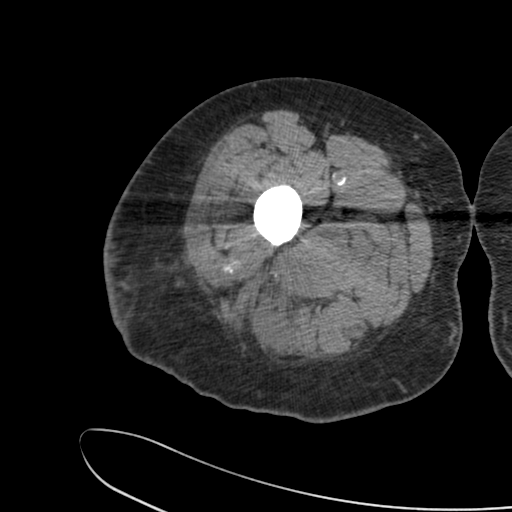
[im 223/279  soft-tissue]
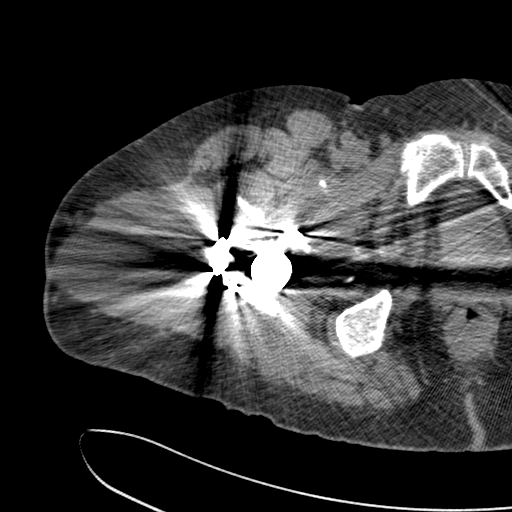

[Series 9: soft (id) · axial · 0.53mm/px · z∈[+18,+411]mm · 6 of 373 slices shown, 8 images]
[im 54/373  soft-tissue]
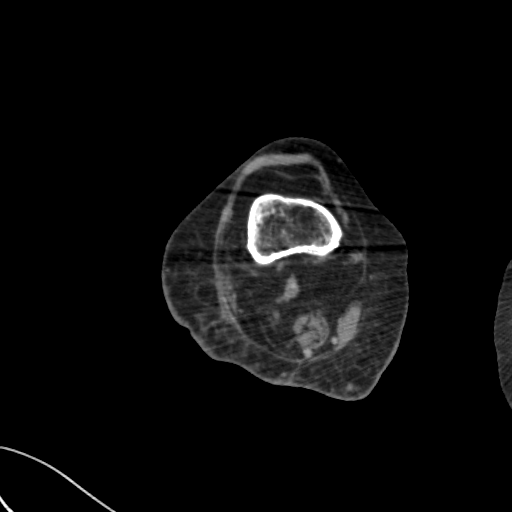
[im 54/373  bone]
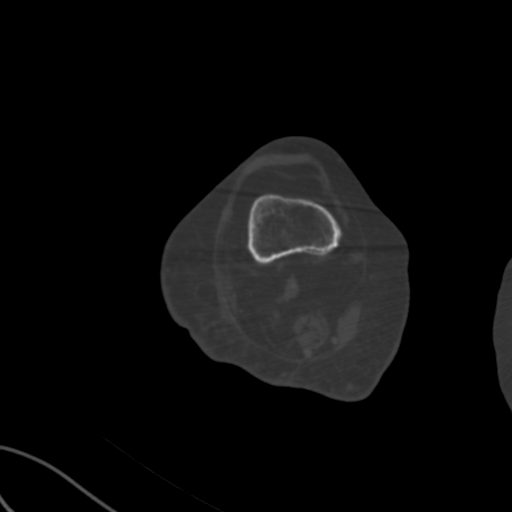
[im 107/373  bone]
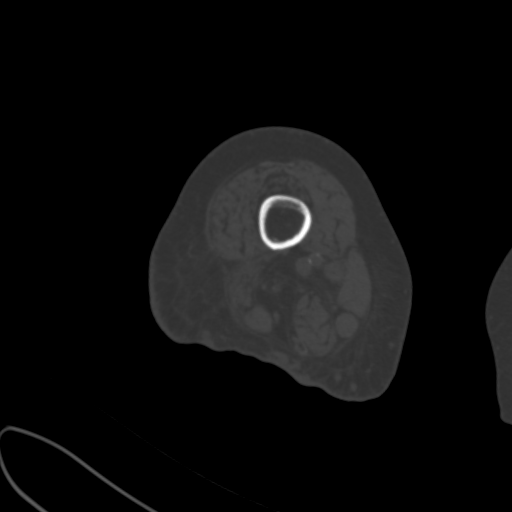
[im 160/373  bone]
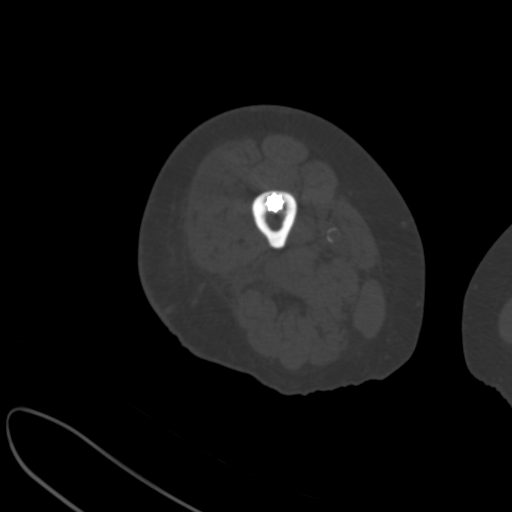
[im 213/373  bone]
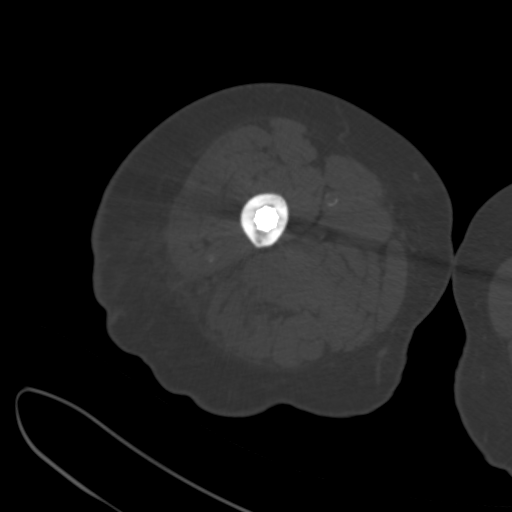
[im 266/373  soft-tissue]
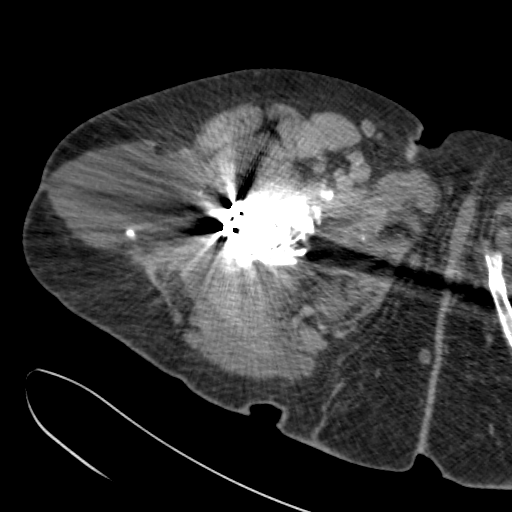
[im 266/373  bone]
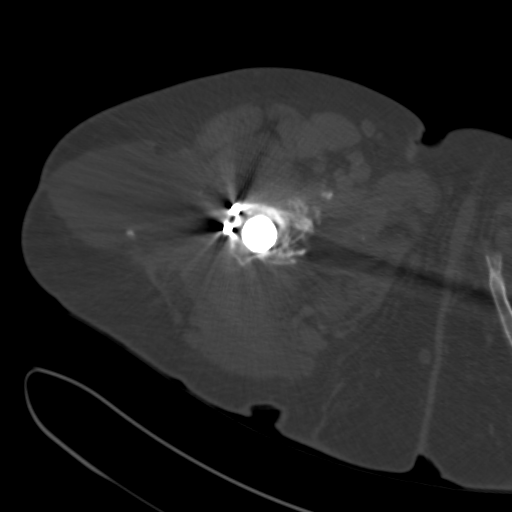
[im 319/373  bone]
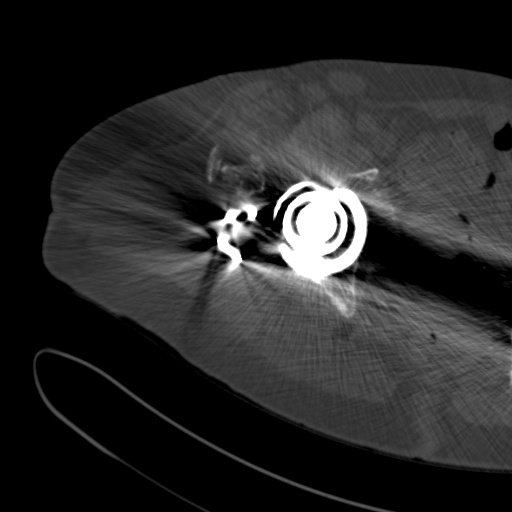

[10 of 14 positions shown; findings below may reference images not displayed]

FINDINGS: Left hip arthroplasty seen on the prior study has been revised. The
patient appears to have new acetabular and femoral components. There
is a fracture of the medial wall of the right acetabulum with
distraction of approximately 2.2 cm in the axial plane. The two most
posterior screws penetrate the cortex of the posterior acetabulum.

Long stem femoral component is in place with multiple cerclage wires
and a fixation plate laterally about superior aspect of the femoral
component. The tip of the femoral stem penetrates the anterior
cortex of the femur with a small nondisplaced fracture identified.
There appears to be remote proximal femur fracture with callus
formation present. The patient's hip replacement is located.

Soft tissues demonstrate a hematoma in the subcutaneous fat of the
right buttock. While discrete measurement is not possible, the
hematoma is large measuring 21 cm craniocaudal by approximately 8 cm
transverse by 7 cm AP. The hematoma extends into the lateral aspect
of the upper right thigh. No intramuscular hematoma is identified.

Bones are osteopenic. There is a remote healed segmental fracture of
the right inferior pubic ramus. Imaged intrapelvic contents are
unremarkable.
IMPRESSION: Status post revision of right total hip arthroplasty. There is a
fracture of the medial wall of the right acetabulum which appears
acute.

The distal most aspect of the stem of the femoral component
penetrates the anterior cortex of the femur where a small fracture
is identified.

Large hematoma in the lateral aspect of the right buttock extends
into the right thigh.

## 2017-04-10 IMAGING — CR DG HIP (WITH OR WITHOUT PELVIS) 2-3V*R*
1 series · 3 of 3 positions shown · non-contrast
Comparison: None.

CLINICAL DATA: Status post right hip replacement.

EXAM:
RIGHT HIP (WITH PELVIS) 2-3 VIEWS

[Series 1: dg hip unilat with pelvis 2-3 views righ · 0.14mm/px · 3 of 3 slices shown]
[im 1/3]
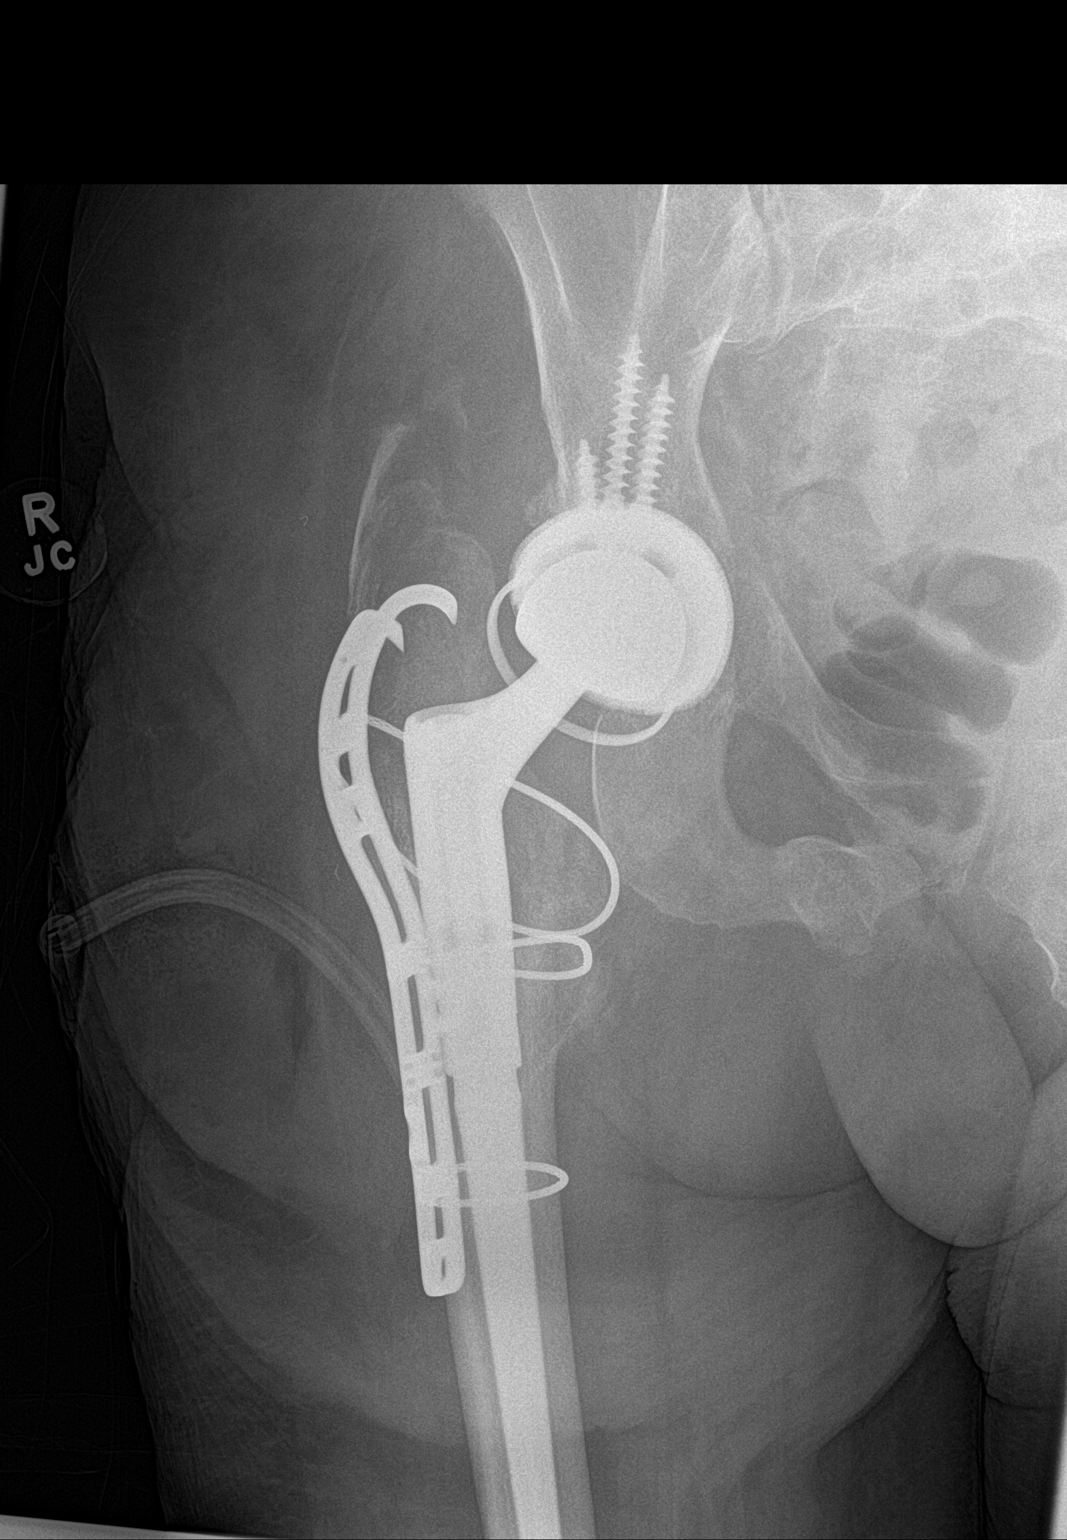
[im 2/3]
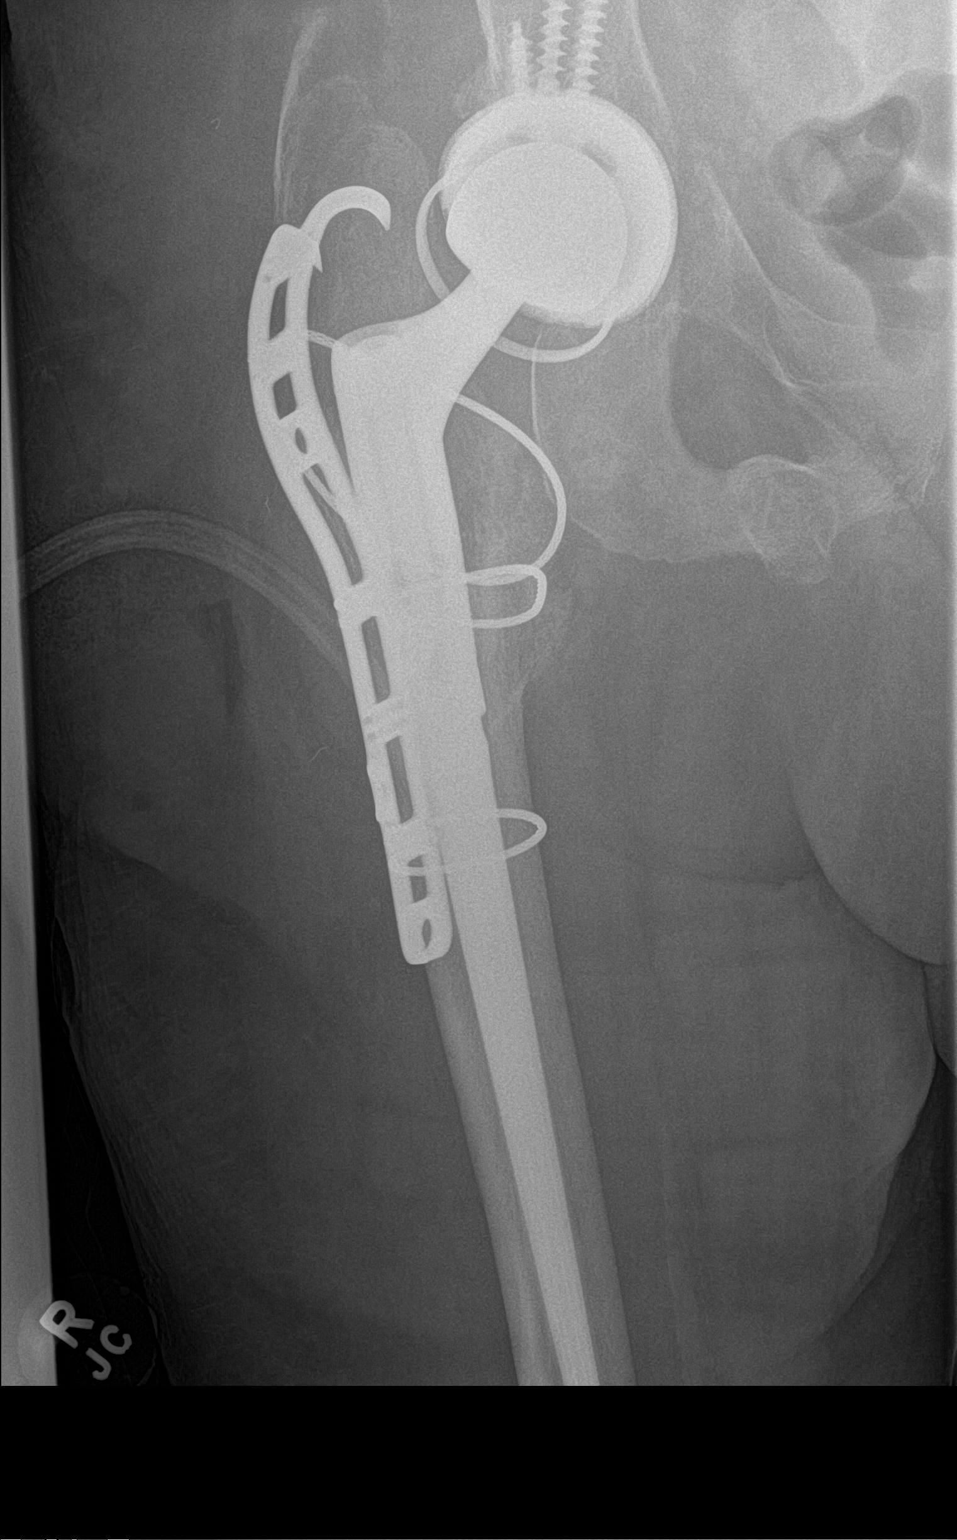
[im 3/3]
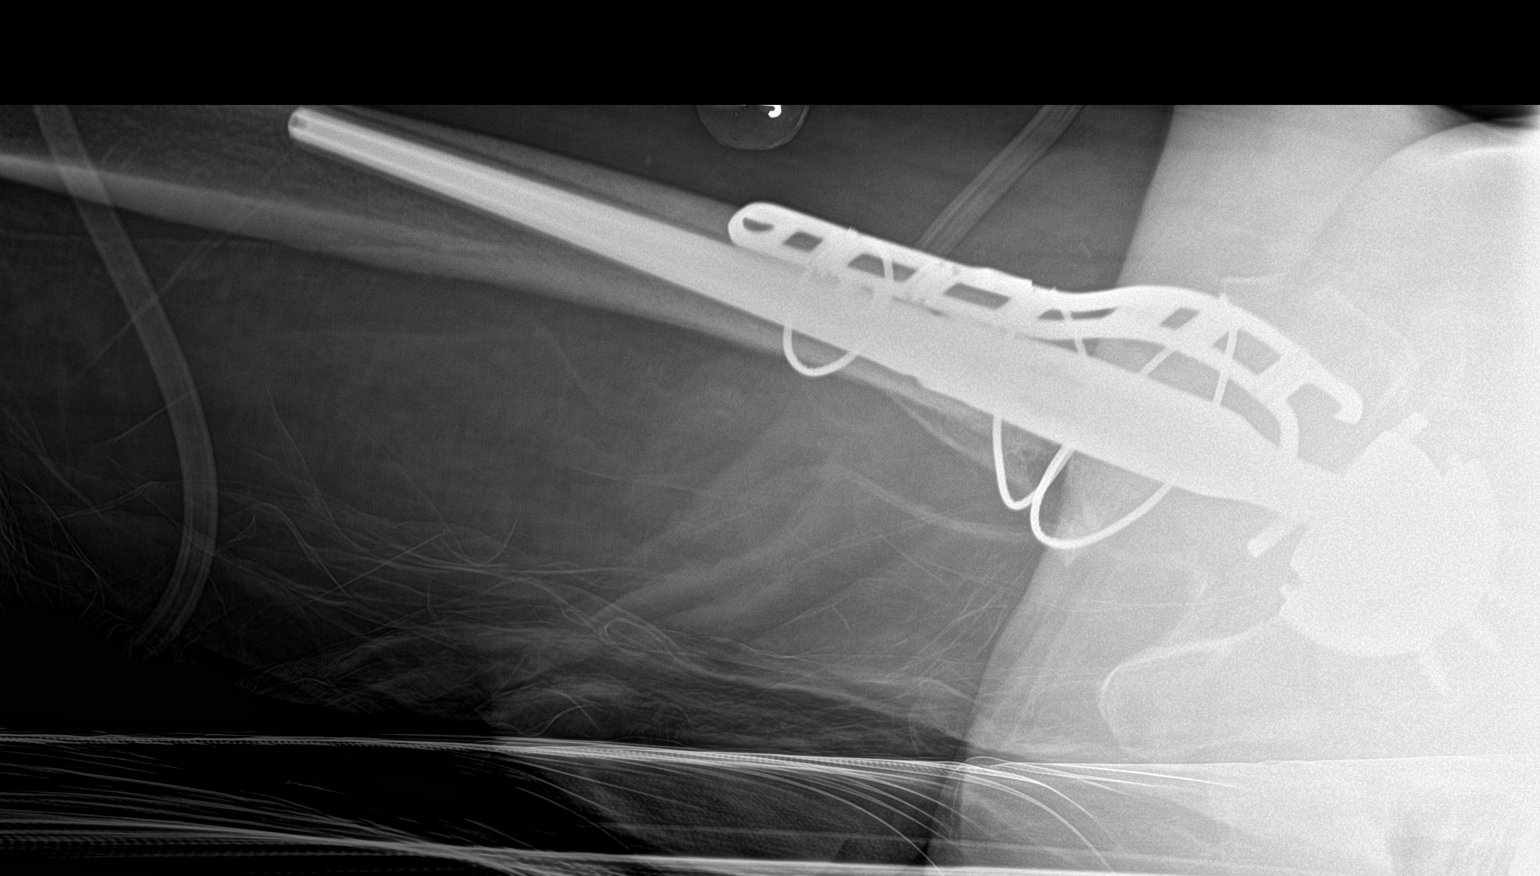

[3 of 3 positions shown; findings below may reference images not displayed]

FINDINGS: Right total hip prosthesis in satisfactory position and alignment
with additional fixation hardware. Old, healed right superior and
inferior pubic ramus fractures. No acute fracture or dislocation
seen.
IMPRESSION: Satisfactory postoperative appearance of a right total hip
prosthesis.
